# Patient Record
Sex: Female | Born: 1935 | Race: White | Hispanic: No | Smoking: Never smoker
Health system: Southern US, Community
[De-identification: ages and names within clinical notes are randomized; demographics above are authoritative.]

## PROBLEM LIST (undated history)

## (undated) DIAGNOSIS — M1990 Unspecified osteoarthritis, unspecified site: Secondary | ICD-10-CM

## (undated) DIAGNOSIS — D649 Anemia, unspecified: Secondary | ICD-10-CM

## (undated) DIAGNOSIS — K279 Peptic ulcer, site unspecified, unspecified as acute or chronic, without hemorrhage or perforation: Secondary | ICD-10-CM

## (undated) DIAGNOSIS — J45909 Unspecified asthma, uncomplicated: Secondary | ICD-10-CM

## (undated) DIAGNOSIS — K2970 Gastritis, unspecified, without bleeding: Secondary | ICD-10-CM

## (undated) DIAGNOSIS — F329 Major depressive disorder, single episode, unspecified: Secondary | ICD-10-CM

## (undated) DIAGNOSIS — K311 Adult hypertrophic pyloric stenosis: Secondary | ICD-10-CM

## (undated) DIAGNOSIS — J449 Chronic obstructive pulmonary disease, unspecified: Secondary | ICD-10-CM

## (undated) DIAGNOSIS — I10 Essential (primary) hypertension: Secondary | ICD-10-CM

## (undated) DIAGNOSIS — N200 Calculus of kidney: Secondary | ICD-10-CM

## (undated) HISTORY — DX: Chronic obstructive pulmonary disease, unspecified: J44.9

## (undated) HISTORY — DX: Calculus of kidney: N20.0

## (undated) HISTORY — DX: Gastritis, unspecified, without bleeding: K29.70

## (undated) HISTORY — DX: Adult hypertrophic pyloric stenosis: K31.1

## (undated) HISTORY — DX: Essential (primary) hypertension: I10

## (undated) HISTORY — DX: Anemia, unspecified: D64.9

## (undated) HISTORY — DX: Peptic ulcer, site unspecified, unspecified as acute or chronic, without hemorrhage or perforation: K27.9

## (undated) HISTORY — DX: Unspecified osteoarthritis, unspecified site: M19.90

## (undated) HISTORY — DX: Unspecified asthma, uncomplicated: J45.909

## (undated) HISTORY — DX: Major depressive disorder, single episode, unspecified: F32.9

---

## 1968-11-26 HISTORY — PX: KIDNEY SURGERY: SHX687

## 1968-11-26 HISTORY — PX: ABDOMINAL HYSTERECTOMY: SHX81

## 1982-11-26 HISTORY — PX: BACK SURGERY: SHX140

## 2004-04-03 ENCOUNTER — Encounter: Admission: RE | Admit: 2004-04-03 | Discharge: 2004-04-03 | Payer: Self-pay | Admitting: Internal Medicine

## 2008-04-21 ENCOUNTER — Encounter: Payer: Self-pay | Admitting: Family Medicine

## 2009-10-17 ENCOUNTER — Encounter: Payer: Self-pay | Admitting: Family Medicine

## 2009-12-02 ENCOUNTER — Ambulatory Visit: Payer: Self-pay | Admitting: Family Medicine

## 2009-12-02 DIAGNOSIS — F3289 Other specified depressive episodes: Secondary | ICD-10-CM

## 2009-12-02 DIAGNOSIS — N200 Calculus of kidney: Secondary | ICD-10-CM

## 2009-12-02 DIAGNOSIS — F329 Major depressive disorder, single episode, unspecified: Secondary | ICD-10-CM

## 2009-12-02 DIAGNOSIS — J45909 Unspecified asthma, uncomplicated: Secondary | ICD-10-CM | POA: Insufficient documentation

## 2009-12-02 DIAGNOSIS — F4321 Adjustment disorder with depressed mood: Secondary | ICD-10-CM

## 2009-12-02 HISTORY — DX: Other specified depressive episodes: F32.89

## 2009-12-02 HISTORY — DX: Calculus of kidney: N20.0

## 2009-12-02 HISTORY — DX: Major depressive disorder, single episode, unspecified: F32.9

## 2009-12-02 HISTORY — DX: Unspecified asthma, uncomplicated: J45.909

## 2009-12-22 ENCOUNTER — Telehealth: Payer: Self-pay | Admitting: Family Medicine

## 2010-01-04 ENCOUNTER — Telehealth: Payer: Self-pay | Admitting: Family Medicine

## 2010-01-27 ENCOUNTER — Ambulatory Visit: Payer: Self-pay | Admitting: Family Medicine

## 2010-01-27 DIAGNOSIS — M1990 Unspecified osteoarthritis, unspecified site: Secondary | ICD-10-CM

## 2010-01-27 HISTORY — DX: Unspecified osteoarthritis, unspecified site: M19.90

## 2010-02-10 ENCOUNTER — Ambulatory Visit: Payer: Self-pay | Admitting: Family Medicine

## 2010-02-10 DIAGNOSIS — I10 Essential (primary) hypertension: Secondary | ICD-10-CM

## 2010-02-10 HISTORY — DX: Essential (primary) hypertension: I10

## 2010-02-13 LAB — CONVERTED CEMR LAB
BUN: 36 mg/dL — ABNORMAL HIGH (ref 6–23)
Chloride: 100 meq/L (ref 96–112)
GFR calc non Af Amer: 46.8 mL/min (ref >60)
Potassium: 4.2 meq/L (ref 3.5–5.1)
Sodium: 136 meq/L (ref 135–145)

## 2010-04-04 ENCOUNTER — Telehealth: Payer: Self-pay | Admitting: Family Medicine

## 2010-04-05 ENCOUNTER — Telehealth: Payer: Self-pay | Admitting: Family Medicine

## 2010-05-15 ENCOUNTER — Ambulatory Visit: Payer: Self-pay | Admitting: Family Medicine

## 2010-06-01 ENCOUNTER — Telehealth: Payer: Self-pay | Admitting: Family Medicine

## 2010-07-03 ENCOUNTER — Telehealth: Payer: Self-pay | Admitting: Family Medicine

## 2010-07-05 ENCOUNTER — Encounter: Admission: RE | Admit: 2010-07-05 | Discharge: 2010-07-05 | Payer: Self-pay | Admitting: Pulmonary Disease

## 2010-08-02 ENCOUNTER — Telehealth: Payer: Self-pay | Admitting: Family Medicine

## 2010-08-17 ENCOUNTER — Ambulatory Visit: Payer: Self-pay | Admitting: Family Medicine

## 2010-08-18 ENCOUNTER — Encounter: Payer: Self-pay | Admitting: Gastroenterology

## 2010-08-21 LAB — CONVERTED CEMR LAB
Basophils Relative: 0.7 % (ref 0.0–3.0)
Eosinophils Relative: 0 % (ref 0.0–5.0)
HCT: 33.9 % — ABNORMAL LOW (ref 36.0–46.0)
Hemoglobin: 11.4 g/dL — ABNORMAL LOW (ref 12.0–15.0)
Lymphs Abs: 1.3 10*3/uL (ref 0.7–4.0)
MCV: 88.6 fL (ref 78.0–100.0)
Monocytes Absolute: 0.7 10*3/uL (ref 0.1–1.0)
Monocytes Relative: 3.9 % (ref 3.0–12.0)
Neutro Abs: 16.7 10*3/uL — ABNORMAL HIGH (ref 1.4–7.7)
WBC: 18.3 10*3/uL (ref 4.5–10.5)

## 2010-08-22 ENCOUNTER — Ambulatory Visit: Payer: Self-pay | Admitting: Family Medicine

## 2010-08-23 LAB — CONVERTED CEMR LAB
Eosinophils Absolute: 0.1 10*3/uL (ref 0.0–0.7)
Eosinophils Relative: 1.2 % (ref 0.0–5.0)
HCT: 29.8 % — ABNORMAL LOW (ref 36.0–46.0)
Lymphs Abs: 2.5 10*3/uL (ref 0.7–4.0)
MCHC: 34.5 g/dL (ref 30.0–36.0)
MCV: 87.6 fL (ref 78.0–100.0)
Monocytes Absolute: 0.9 10*3/uL (ref 0.1–1.0)
Neutrophils Relative %: 68.4 % (ref 43.0–77.0)
Platelets: 306 10*3/uL (ref 150.0–400.0)
RDW: 13.8 % (ref 11.5–14.6)
WBC: 11.5 10*3/uL — ABNORMAL HIGH (ref 4.5–10.5)

## 2010-09-25 ENCOUNTER — Ambulatory Visit: Payer: Self-pay | Admitting: Family Medicine

## 2010-09-25 DIAGNOSIS — R05 Cough: Secondary | ICD-10-CM

## 2010-09-25 DIAGNOSIS — D649 Anemia, unspecified: Secondary | ICD-10-CM | POA: Insufficient documentation

## 2010-09-25 HISTORY — DX: Anemia, unspecified: D64.9

## 2010-09-28 LAB — CONVERTED CEMR LAB
Eosinophils Absolute: 0.2 10*3/uL (ref 0.0–0.7)
Eosinophils Relative: 1.3 % (ref 0.0–5.0)
Lymphocytes Relative: 25.3 % (ref 12.0–46.0)
MCV: 88.4 fL (ref 78.0–100.0)
Monocytes Absolute: 1.6 10*3/uL — ABNORMAL HIGH (ref 0.1–1.0)
Neutrophils Relative %: 63.8 % (ref 43.0–77.0)
Platelets: 388 10*3/uL (ref 150.0–400.0)
WBC: 17.9 10*3/uL — ABNORMAL HIGH (ref 4.5–10.5)

## 2010-09-29 ENCOUNTER — Ambulatory Visit: Payer: Self-pay | Admitting: Cardiology

## 2010-09-29 ENCOUNTER — Ambulatory Visit: Payer: Self-pay | Admitting: Family Medicine

## 2010-09-29 ENCOUNTER — Encounter (INDEPENDENT_AMBULATORY_CARE_PROVIDER_SITE_OTHER): Payer: Self-pay | Admitting: *Deleted

## 2010-09-29 DIAGNOSIS — R634 Abnormal weight loss: Secondary | ICD-10-CM

## 2010-09-29 LAB — CONVERTED CEMR LAB
Bilirubin Urine: NEGATIVE
Glucose, Urine, Semiquant: NEGATIVE
pH: 5.5

## 2010-10-01 LAB — CONVERTED CEMR LAB
Alkaline Phosphatase: 59 units/L (ref 39–117)
Bilirubin, Direct: 0.1 mg/dL (ref 0.0–0.3)
Calcium: 9.4 mg/dL (ref 8.4–10.5)
GFR calc non Af Amer: 51.65 mL/min (ref >60)
Sodium: 139 meq/L (ref 135–145)

## 2010-10-03 ENCOUNTER — Encounter (INDEPENDENT_AMBULATORY_CARE_PROVIDER_SITE_OTHER): Payer: Self-pay | Admitting: *Deleted

## 2010-10-03 ENCOUNTER — Ambulatory Visit: Payer: Self-pay | Admitting: Gastroenterology

## 2010-10-03 ENCOUNTER — Encounter: Payer: Self-pay | Admitting: Family Medicine

## 2010-10-03 DIAGNOSIS — J4489 Other specified chronic obstructive pulmonary disease: Secondary | ICD-10-CM

## 2010-10-03 DIAGNOSIS — J449 Chronic obstructive pulmonary disease, unspecified: Secondary | ICD-10-CM

## 2010-10-03 HISTORY — DX: Chronic obstructive pulmonary disease, unspecified: J44.9

## 2010-10-03 HISTORY — DX: Other specified chronic obstructive pulmonary disease: J44.89

## 2010-10-03 LAB — CONVERTED CEMR LAB
AST: 27 units/L (ref 0–37)
Albumin: 4 g/dL (ref 3.5–5.2)
BUN: 33 mg/dL — ABNORMAL HIGH (ref 6–23)
Basophils Relative: 0.2 % (ref 0.0–3.0)
Eosinophils Relative: 0.3 % (ref 0.0–5.0)
Folate: >20 ng/mL
GFR calc non Af Amer: 41.49 mL/min (ref >60)
Glucose, Bld: 124 mg/dL — ABNORMAL HIGH (ref 70–99)
HCT: 34.2 % — ABNORMAL LOW (ref 36.0–46.0)
Hemoglobin: 11.7 g/dL — ABNORMAL LOW (ref 12.0–15.0)
IgA: 324 mg/dL (ref 68–378)
Lymphs Abs: 2.1 10*3/uL (ref 0.7–4.0)
MCV: 88.1 fL (ref 78.0–100.0)
Monocytes Absolute: 1.4 10*3/uL — ABNORMAL HIGH (ref 0.1–1.0)
Potassium: 3.7 meq/L (ref 3.5–5.1)
RBC: 3.89 M/uL (ref 3.87–5.11)
TSH: 0.89 microintl units/mL (ref 0.35–5.50)
Tissue Transglutaminase Ab, IgA: 12.2 units (ref <20)
WBC: 21.1 10*3/uL (ref 4.5–10.5)

## 2010-10-04 ENCOUNTER — Inpatient Hospital Stay (HOSPITAL_COMMUNITY): Admission: AD | Admit: 2010-10-04 | Discharge: 2010-10-18 | Payer: Self-pay | Admitting: Gastroenterology

## 2010-10-04 ENCOUNTER — Ambulatory Visit: Payer: Self-pay | Admitting: Gastroenterology

## 2010-10-04 ENCOUNTER — Telehealth: Payer: Self-pay | Admitting: Gastroenterology

## 2010-10-10 ENCOUNTER — Encounter: Payer: Self-pay | Admitting: Internal Medicine

## 2010-10-16 ENCOUNTER — Encounter: Payer: Self-pay | Admitting: Gastroenterology

## 2010-10-23 ENCOUNTER — Ambulatory Visit: Payer: Self-pay | Admitting: Family Medicine

## 2010-10-23 ENCOUNTER — Encounter: Payer: Self-pay | Admitting: Family Medicine

## 2010-10-23 DIAGNOSIS — K279 Peptic ulcer, site unspecified, unspecified as acute or chronic, without hemorrhage or perforation: Secondary | ICD-10-CM

## 2010-10-23 HISTORY — DX: Peptic ulcer, site unspecified, unspecified as acute or chronic, without hemorrhage or perforation: K27.9

## 2010-10-31 ENCOUNTER — Encounter (INDEPENDENT_AMBULATORY_CARE_PROVIDER_SITE_OTHER): Payer: Self-pay | Admitting: *Deleted

## 2010-10-31 ENCOUNTER — Ambulatory Visit: Payer: Self-pay | Admitting: Gastroenterology

## 2010-11-22 ENCOUNTER — Ambulatory Visit: Payer: Self-pay | Admitting: Family Medicine

## 2010-11-29 LAB — CONVERTED CEMR LAB
Basophils Relative: 0.8 % (ref 0.0–3.0)
Eosinophils Relative: 1.6 % (ref 0.0–5.0)
Lymphocytes Relative: 23.2 % (ref 12.0–46.0)
MCV: 85 fL (ref 78.0–100.0)
Monocytes Relative: 7.2 % (ref 3.0–12.0)
Neutrophils Relative %: 67.2 % (ref 43.0–77.0)
RBC: 3.47 M/uL — ABNORMAL LOW (ref 3.87–5.11)
WBC: 11.4 10*3/uL — ABNORMAL HIGH (ref 4.5–10.5)

## 2010-12-11 ENCOUNTER — Telehealth: Payer: Self-pay | Admitting: Family Medicine

## 2010-12-13 ENCOUNTER — Other Ambulatory Visit: Payer: Self-pay | Admitting: Gastroenterology

## 2010-12-13 ENCOUNTER — Encounter: Payer: Self-pay | Admitting: Gastroenterology

## 2010-12-13 ENCOUNTER — Ambulatory Visit
Admission: RE | Admit: 2010-12-13 | Discharge: 2010-12-13 | Payer: Self-pay | Source: Home / Self Care | Attending: Gastroenterology | Admitting: Gastroenterology

## 2010-12-13 DIAGNOSIS — K2970 Gastritis, unspecified, without bleeding: Secondary | ICD-10-CM | POA: Insufficient documentation

## 2010-12-13 DIAGNOSIS — K2990 Gastroduodenitis, unspecified, without bleeding: Secondary | ICD-10-CM

## 2010-12-14 LAB — HELICOBACTER PYLORI SCREEN-BIOPSY: UREASE: NEGATIVE

## 2010-12-28 NOTE — Assessment & Plan Note (Signed)
Summary: BRAND NEW PT/TO EST/PER DR Maryalyce Sanjuan/CJR   Vital Signs:  Patient profile:   76 year old female Menstrual status:  hysterectomy Height:      59.50 inches Weight:      122 pounds BMI:     24.32 Temp:     98.6 degrees F oral Pulse rate:   80 / minute Pulse rhythm:   regular Resp:     12 per minute BP sitting:   154 / 86  (left arm) Cuff size:   regular  Vitals Entered By: Sid Falcon LPN (December 02, 2009 11:24 AM) CC: New pt to establish     Menstrual Status hysterectomy   History of Present Illness: New patient to establish care.  Her problems include history of depression, remote history of kidney stones, glaucoma, mild intermittent asthma. She sees an ophthalmologist regularly. Depression stable on Zoloft. She has been on this for many years. No recent cough or wheezing. Uses albuterol as needed. No history of smoking but husband has been a long-term smoker.  no hx of hypertension.  No recent HA, dizziness, palpitations, or chest pain.  Surgical history includes hysterectomy around 1970 secondary to prolapse. She is not sure if this was a total abdominal hysterectomy. Lumbar disc surgery 1980s. No recent low back pain.  Family history unrevealing. Social history patient is married. Never smoked. No alcohol use. Has not had flu vaccine yet this year. She thinks she had prior Pneumovax. We are waiting for old records.  Preventive Screening-Counseling & Management  Alcohol-Tobacco     Smoking Status: never  Caffeine-Diet-Exercise     Does Patient Exercise: no  Past History:  Social History: Last updated: 12/02/2009 Retired Married Never Smoked Alcohol use-no Regular exercise-no  Risk Factors: Exercise: no (12/02/2009)  Risk Factors: Smoking Status: never (12/02/2009)  Past Medical History: Arthritis Asthma Glaucoma Kidney stones UTI  Past Surgical History: Hysterectomy  1970 Back surgery 1984 Kidney surgery 1970  Social  History: Retired Married Never Smoked Alcohol use-no Regular exercise-no Smoking Status:  never Does Patient Exercise:  no  Review of Systems  The patient denies anorexia, fever, weight loss, weight gain, vision loss, chest pain, syncope, dyspnea on exertion, peripheral edema, prolonged cough, headaches, hemoptysis, abdominal pain, melena, hematochezia, severe indigestion/heartburn, incontinence, and difficulty walking.    Physical Exam  General:  Well-developed,well-nourished,in no acute distress; alert,appropriate and cooperative throughout examination Eyes:  pupils equal, pupils round, and pupils reactive to light.   Ears:  External ear exam shows no significant lesions or deformities.  Otoscopic examination reveals clear canals, tympanic membranes are intact bilaterally without bulging, retraction, inflammation or discharge. Hearing is grossly normal bilaterally. Nose:  External nasal examination shows no deformity or inflammation. Nasal mucosa are pink and moist without lesions or exudates. Mouth:  Oral mucosa and oropharynx without lesions or exudates.  Teeth in good repair. Neck:  No deformities, masses, or tenderness noted. Lungs:  Normal respiratory effort, chest expands symmetrically. Lungs are clear to auscultation, no crackles or wheezes. Heart:  normal rate, regular rhythm, and no gallop.   Extremities:  no edema   Impression & Recommendations:  Problem # 1:  ASTHMA (ICD-493.90) mild and intermittent. Flu vaccine recommended. Need to confirm Pneumovax status from old records.  Problem # 2:  DEPRESSION (ICD-311)  Her updated medication list for this problem includes:    Sertraline Hcl 100 Mg Tabs (Sertraline hcl) .Marland Kitchen... 1/2 daily  Problem # 3:  ELEVATED BLOOD PRESSURE (ICD-796.2) Assessment: New no reported history  of hypertension. Bring back to reassess him in a couple of months. Lifestyle factors discussed.  Problem # 4:  NEPHROLITHIASIS (ICD-592.0)  Complete  Medication List: 1)  Oxycodone-acetaminophen 10-325 Mg Tabs (Oxycodone-acetaminophen) .... One tab 4 times daily 2)  Celebrex 200 Mg Caps (Celecoxib) .... Once daily 3)  Sertraline Hcl 100 Mg Tabs (Sertraline hcl) .... 1/2 daily 4)  Allergy Relief 4 Mg Tabs (Chlorpheniramine maleate) .... Two to four daily otc 5)  Xalatan 0.005 % Soln (Latanoprost) .... One drop to right eye daily 6)  Womens One Daily Tabs (Multiple vitamins-minerals) .... Once daily 7)  Vitamin B-12 500 Mcg Tabs (Cyanocobalamin) .... Once daily 8)  Eye-vite Plus Lutein Caps (Multiple vitamins-minerals) .... Once daily  Other Orders: Admin 1st Vaccine ([address]) Flu Vaccine 105yrs + ([address]454)  Patient Instructions: 1)  Please schedule a follow-up appointment in 2 months.  2)  Limit your Sodium(salt) .   Flu Vaccine Consent Questions     Do you have a history of severe allergic reactions to this vaccine? no    Any prior history of allergic reactions to egg and/or gelatin? no    Do you have a sensitivity to the preservative Thimersol? no    Do you have a past history of Guillan-Barre Syndrome? no    Do you currently have an acute febrile illness? no    Have you ever had a severe reaction to latex? no    Vaccine information given and explained to patient? yes    Are you currently pregnant? no    Lot Number:[id number]   Exp Date:05/25/2010   Site Given  Left Deltoid IMflu

## 2010-12-28 NOTE — Assessment & Plan Note (Signed)
Summary: 2 month rov/njr   Vital Signs:  Patient profile:   76 year old female Menstrual status:  hysterectomy Weight:      122 pounds Temp:     98.5 degrees F oral BP sitting:   170 / 110  (left arm) Cuff size:   regular  Vitals Entered By: Sid Falcon LPN (January 28, 6439 1:10 PM)  Serial Vital Signs/Assessments:  Time      Position  BP       Pulse  Resp  Temp     By                     170/90                         Evelena Peat MD  CC: 2 month follow-up, BP check   History of Present Illness: Patient here for followup regarding several items as follows.  History of elevated blood pressure last visit. Has checked at home a couple of occasions with blood pressures around 160 systolic. Occasional flushed feeling but no headaches or dizziness. Denies chest pains. Never treated for hypertension. Does not add salt to foods.  Bilateral shoulder pains left greater than right. No injury. Aching pain especially at night and with abduction and internal rotation. Denies weakness. Pain for several weeks and somewhat progressive.  Using Celebrex and also on Oxycodone chronically for pain without relief.  History osteoarthritis takes chronic oxycodone and is requesting refills. Also uses Celebrex 200 mg daily.  Allergies: 1)  Mobic (Meloxicam) 2)  Demerol (Meperidine Hcl)  Past History:  Past Medical History: Last updated: 12/02/2009 Arthritis Asthma Glaucoma Kidney stones UTI  Past Surgical History: Last updated: 12/02/2009 Hysterectomy  1970 Back surgery 1984 Kidney surgery 1970  Social History: Last updated: 12/02/2009 Retired Married Never Smoked Alcohol use-no Regular exercise-no PMH-FH-SH reviewed for relevance  Review of Systems  The patient denies anorexia, fever, weight loss, chest pain, syncope, dyspnea on exertion, peripheral edema, headaches, abdominal pain, melena, hematochezia, and difficulty walking.    Physical Exam  General:   Well-developed,well-nourished,in no acute distress; alert,appropriate and cooperative throughout examination Mouth:  Oral mucosa and oropharynx without lesions or exudates.  Teeth in good repair. Neck:  No deformities, masses, or tenderness noted. Lungs:  Normal respiratory effort, chest expands symmetrically. Lungs are clear to auscultation, no crackles or wheezes. Heart:  normal rate and regular rhythm.   Extremities:  no edema. No reproducible point tenderness to the shoulder. Pain with internal rotation and abduction against resistance left shoulder. No a.c. joint tenderness. No bicipital tenderness. Neurologic:  alert & oriented X3 and cranial nerves II-XII intact.     Impression & Recommendations:  Problem # 1:  ELEVATED BLOOD PRESSURE (ICD-796.2) Assessment Deteriorated  need to initiate treatment. We'll start lisinopril HCTZ and reassess blood pressure 2 weeks.  Check BMP then.  Her updated medication list for this problem includes:    Lisinopril-hydrochlorothiazide 10-12.5 Mg Tabs (Lisinopril-hydrochlorothiazide) ..... 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 Prepped the left shoulder with Betadine and using 25-gauge 1-1/2 inch needle injected 1 cc of Depo-Medrol and 2 cc of plain Xylocaine without difficulty. Work on range of motion and reassess in 2 weeks Orders: Joint Aspirate / Injection, Large (20610) Depo- Medrol 80mg  (J1040)  Problem # 3:  OSTEOARTHRITIS (  ICD-715.90) Assessment: Unchanged refilled meds. Her updated medication list for this problem includes:    Oxycodone-acetaminophen 10-325 Mg Tabs (Oxycodone-acetaminophen) ..... One tab 4 times daily    Celebrex 200 Mg Caps (Celecoxib) ..... Once daily  Complete Medication List: 1)  Oxycodone-acetaminophen 10-325 Mg Tabs (Oxycodone-acetaminophen)  .... One tab 4 times daily 2)  Celebrex 200 Mg Caps (Celecoxib) .... Once daily 3)  Sertraline Hcl 100 Mg Tabs (Sertraline hcl) .... 1/2 daily 4)  Allergy Relief 4 Mg Tabs (Chlorpheniramine maleate) .... Two to four daily otc 5)  Xalatan 0.005 % Soln (Latanoprost) .... One drop to right eye daily 6)  Womens One Daily Tabs (Multiple vitamins-minerals) .... Once daily 7)  Vitamin B-12 500 Mcg Tabs (Cyanocobalamin) .... Once daily 8)  Eye-vite Plus Lutein Caps (Multiple vitamins-minerals) .... Once daily 9)  Lisinopril-hydrochlorothiazide 10-12.5 Mg Tabs (Lisinopril-hydrochlorothiazide) .... One by mouth once daily  Patient Instructions: 1)  Please schedule a follow-up appointment in 2 weeks.  2)  Limit your Sodium(salt) .  Prescriptions: CELEBREX 200 MG CAPS (CELECOXIB) once daily  #30 x 5   Entered and Authorized by:   Evelena Peat MD   Signed by:   Evelena Peat MD on 01/27/2010   Method used:   [facility]        [facility] [address] [phone]* (retail)       [address]       [place], [place]  [address]       Ph: [phone] or [phone]       Fax: [phone]   RxID:   [id number] LISINOPRIL-HYDROCHLOROTHIAZIDE 10-12.5 MG TABS (LISINOPRIL-HYDROCHLOROTHIAZIDE) one by mouth once daily  #30 x 5   Entered and Authorized by:   Evelena Peat MD   Signed by:   Evelena Peat MD on 01/27/2010   Method used:   [facility]        [facility] [address] [phone]* (retail)       [address]       [place], [place]  [address]       Ph: [phone] or [phone]       Fax: [phone]   RxID:   [id number] OXYCODONE-ACETAMINOPHEN 10-325 MG TABS (OXYCODONE-ACETAMINOPHEN) one tab 4 times daily  #120 x 0   Entered and Authorized by:   Evelena Peat MD   Signed by:   Evelena Peat MD on 01/27/2010   Method used:   Print then Give to Patient   RxID:   [id number]

## 2010-12-28 NOTE — Progress Notes (Signed)
Summary: pt req script for Oxycodone 10-325mg   Phone Note Refill Request Call back at Home Phone [phone]   Refills Requested: Medication #1:  OXYCODONE-ACETAMINOPHEN 10-325 MG TABS one tab 4 times daily   Dosage confirmed as above?Dosage Confirmed   Supply Requested: 1 month  Method Requested: Pick up at Office Initial call taken by: Lucy Antigua,  June 01, 2010 10:37 AM  Follow-up for Phone Call        Last filled 5-11 [facility] LPN  June 01, 1477 5:34 PM refilled. Follow-up by: Evelena Peat MD,  June 02, 2010 7:51 AM  Additional Follow-up for Phone Call Additional follow up Details #1::        Husband informed RX ready for pick-up Additional Follow-up by: Sid Falcon LPN,  June 03, 2955 8:28 AM    Prescriptions: OXYCODONE-ACETAMINOPHEN 10-325 MG TABS (OXYCODONE-ACETAMINOPHEN) one tab 4 times daily  #120 x 0   Entered and Authorized by:   Evelena Peat MD   Signed by:   Evelena Peat MD on 06/02/2010   Method used:   Print then Give to Patient   RxID:   [id number]

## 2010-12-28 NOTE — Letter (Signed)
Summary: EGD Instructions  [facility]  [address] [place], [place] [address]   Phone: [phone]  Fax: [phone]       MARIAN MENEELY    1935/01/11    MRN: [id number]       Procedure Day /Date: 10/04/2010 Wednesday     Arrival Time: 7:30am     Procedure Time: 8:00am     Location of Procedure:                    [facility] (4th Floor)    PREPARATION FOR ENDOSCOPY   On 10/04/2010 THE DAY OF THE PROCEDURE:  1.   No solid foods, milk or milk products are allowed after midnight the night before your procedure.  2.   Do not drink anything colored red or purple.  Avoid juices with pulp.  No orange juice.  3.  You may drink clear liquids until 6:00am, which is 2 hours before your procedure.                                                                                                CLEAR LIQUIDS INCLUDE: Water Jello Ice Popsicles Tea (sugar ok, no milk/cream) Powdered fruit flavored drinks Coffee (sugar ok, no milk/cream) Gatorade Juice: apple, white grape, white cranberry  Lemonade Clear bullion, consomm, broth Carbonated beverages (any kind) Strained chicken noodle soup Hard Candy   MEDICATION INSTRUCTIONS  Unless otherwise instructed, you should take regular prescription medications with a small sip of water as early as possible the morning of your procedure.               OTHER INSTRUCTIONS  You will need a responsible adult at least 74 years of age to accompany you and drive you home.   This person must remain in the waiting room during your procedure.  Wear loose fitting clothing that is easily removed.  Leave jewelry and other valuables at home.  However, you may wish to bring a book to read or an iPod/MP3 player to listen to music as you wait for your procedure to start.  Remove all body piercing jewelry and leave at home.  Total time from sign-in until discharge is approximately 2-3 hours.  You should go  home directly after your procedure and rest.  You can resume normal activities the day after your procedure.  The day of your procedure you should not:   Drive   Make legal decisions   Operate machinery   Drink alcohol   Return to work  You will receive specific instructions about eating, activities and medications before you leave.    The above instructions have been reviewed and explained to me by   _______________________    I fully understand and can verbalize these instructions _____________________________ Date _________

## 2010-12-28 NOTE — Progress Notes (Signed)
Summary: MRI now needs clarifications  Phone Note From Other Clinic   Caller: [facility] [phone] Call For: Dr Jarold Motto Summary of Call: Is starting MRI now and needs clarifications on order. Initial call taken by: Leanor Kail [facility],  October 04, 2010 2:11 PM  Follow-up for Phone Call        advised them to page Mike Gip, PA since pt is INPT. Follow-up by: [name] CMA Duncan Dull),  October 04, 2010 2:18 PM

## 2010-12-28 NOTE — Progress Notes (Signed)
Summary: new rx @ new pharmacy  Phone Note Refill Request Call back at Home Phone [phone] Message from:  Patient---live call on *****new pharmacy******  Refills Requested: Medication #1:  SERTRALINE HCL 100 MG TABS 1/2 daily [facility]  Initial call taken by: Warnell Forester,  July 03, 2010 2:45 PM    Prescriptions: SERTRALINE HCL 100 MG TABS (SERTRALINE HCL) 1/2 daily  #30 x 1   Entered by:   Sid Falcon LPN   Authorized by:   Evelena Peat MD   Signed by:   Sid Falcon LPN on 09/81/1914   Method used:   [facility]        [facility] [address] [phone]* (retail)       [address]       [place], [place]  [address]       Ph: [phone] or [phone]       Fax: [phone]   RxID:   [id number]

## 2010-12-28 NOTE — Procedures (Signed)
Summary: Upper Endoscopy  Patient: [name] Herman Note: All result statuses are Final unless otherwise noted.  Tests: (1) Upper Endoscopy (EGD)   EGD Upper Endoscopy       DONE      [facility]     [address]     [place], [place]  [address]           ENDOSCOPY PROCEDURE REPORT           PATIENT:  [name] Herman, [name] Herman  MR#:  [id number]     BIRTHDATE:  1935/01/11, 75 yrs. old  GENDER:  female     ENDOSCOPIST:  Rachael Fee, MD     PROCEDURE DATE:  10/16/2010     PROCEDURE:  EGD, diagnostic 29528     ASA CLASS:  Class II     INDICATIONS:  previous pyloric stenosis (Dr. Jarold Motto EGD 2 weeks     ago), small gastric ulcer, H. pylori + by CLO and was taking     NSAIDs.     MEDICATIONS:  Fentanyl 50 mcg IV, Versed 4 mg IV     TOPICAL ANESTHETIC:  Cetacaine Spray     DESCRIPTION OF PROCEDURE:   After the risks benefits and     alternatives of the procedure were thoroughly explained, informed     consent was obtained.  The Pentax Gastroscope [id number] endoscope     was introduced through the mouth and advanced to the second     portion of the duodenum, without limitations.  The instrument was     slowly withdrawn as the mucosa was fully examined.     <<PROCEDUREIMAGES>>     The previously noted pylroric stenosis has improved somewhat. I     was able to get a standard adult gastroscope through the benign     appearing stenosis with mild pressure. The lumen was 3mm,     surrounded by signficant edema. Bulb of duodenum, 2nd duodenum     were normal (see image003 and image004).  Otherwise the     examination was normal (see image005 and image001).  There was a     small amount of retained liquid in proximal stomach (see     image002).    Retroflexed views revealed no abnormalities.    The     scope was then withdrawn from the patient and the procedure     completed.     COMPLICATIONS:  None           ENDOSCOPIC IMPRESSION:     1) Pyloric stenosis, improving but still  present     2) Otherwise normal examination           RECOMMENDATIONS:     Full liquid diet, should continue this for another 7 days.     Increase diet slowly as tolerated after that.     OK to d/c home after current TNA bag is completed as long as she     continues to tolerate full liquids     Will change to PO H. pylori treatment (should continue for 14     days total).     Should avoid NSAIDs.           ______________________________     Rachael Fee, MD           n.     eSIGNED:   Rachael Fee at 10/16/2010 12:08 PM           Rushie Nyhan, [phone]  Note: An exclamation mark (!) indicates a result that was not dispersed into the flowsheet. Document Creation Date: 10/16/2010 12:09 PM _______________________________________________________________________  (1) Order result status: Final Collection or observation date-time: 10/16/2010 11:56 Requested date-time:  Receipt date-time:  Reported date-time:  Referring Physician:   Ordering Physician: Rob Bunting [phone]) Specimen Source:  Source: Launa Grill Order Number: [id number] Lab site:

## 2010-12-28 NOTE — Assessment & Plan Note (Signed)
Summary: DIARRHEA & HEMATOCHEZIA/YF   History of Present Illness Primary GI MD: Sheryn Bison MD FACP FAGA Primary Clotilde Loth: Evelena Peat, MD Requesting Waylan Busta: Evelena Peat, MD Chief Complaint: Generalized abd pain and cramping with lower back pain. Pt also has 3-4 days out of a week of N/V. Pt states she does not have a appetitie and has lost 15 lbs over 6 months. Pt states she had one episode of rectal bleeding 4-5 months ago and has not seen any blood since.  History of Present Illness:   76 year old Caucasian female referred by Dr. Caryl Never for evaluation of anorexia, aching pain weight loss, acid reflux symptoms, upper abdominal pain radiating into her back, nausea and vomiting, gas and bloating. These probably been going on for several months and exacerbated by chronic coughing related to lisinopril use. She also has a history of osteoporosis and probable compression fracture of her spine which limits her mobility. She is chronically on hydrocodone several times a day but denies constipation. She had one episode of rectal bleeding 2 weeks ago. Review of her chart shows no specific abnormalities except for not explain leukocytosis currently being treated with Cipro 250 mg b.i.d. for 7 days. She denies genitourinary complaints, fever, chills, icterus, or other systemic problems. She also denies any specific hepatobiliary complaints.  Recent CT scan of the abdomen showed multiple liver lesions, metastases versus liver cyst. The bases of the lungs were seen on CT scan were normal. She continues with a chronic nonproductive cough and rather typical acid reflux symptoms. She does have a diagnosis of asthmatic bronchitis. She denies abuse of alcohol, cigarettes, or NSAIDs. Family history is remarkable for prostate cancer her father but no known colon cancer.   GI Review of Systems    Reports abdominal pain, acid reflux, loss of appetite, nausea, vomiting, and  weight loss.     Location of   Abdominal pain: generalized. Weight loss of 15 lbs pounds over 6 months.   Denies belching, bloating, chest pain, dysphagia with liquids, dysphagia with solids, heartburn, vomiting blood, and  weight gain.      Reports rectal bleeding.     Denies anal fissure, black tarry stools, change in bowel habit, constipation, diarrhea, diverticulosis, fecal incontinence, heme positive stool, hemorrhoids, irritable bowel syndrome, jaundice, light color stool, liver problems, and  rectal pain.    Current Medications (verified): 1)  Oxycodone-Acetaminophen 10-325 Mg Tabs (Oxycodone-Acetaminophen) .... One Tab 4 Times Daily 2)  Celebrex 200 Mg Caps (Celecoxib) .... Once Daily 3)  Sertraline Hcl 100 Mg Tabs (Sertraline Hcl) .... 1/2 Daily 4)  Allergy Relief 4 Mg Tabs (Chlorpheniramine Maleate) .... Two To Four Daily Otc 5)  Xalatan 0.005 % Soln (Latanoprost) .... One Drop To Right Eye Daily 6)  Womens One Daily  Tabs (Multiple Vitamins-Minerals) .... Once Daily 7)  Vitamin B-12 500 Mcg Tabs (Cyanocobalamin) .... Once Daily 8)  Eye-Vite Plus Lutein  Caps (Multiple Vitamins-Minerals) .... Once Daily 9)  Losartan Potassium-Hctz 50-12.5 Mg Tabs (Losartan Potassium-Hctz) .... One By Mouth Once Daily 10)  Promethazine Hcl 25 Mg Tabs (Promethazine Hcl) .... One By Mouth Q 4-6 Hours As Needed Nausea and Vomiting 11)  Ciprofloxacin Hcl 250 Mg Tabs (Ciprofloxacin Hcl) .... One By Mouth Two Times A Day For 7 Days  Allergies (verified): 1)  Mobic (Meloxicam) 2)  Demerol (Meperidine Hcl)  Past History:  Past medical, surgical, family and social histories (including risk factors) reviewed for relevance to current acute and chronic problems.  Past Medical History: Reviewed  history from 05/15/2010 and no changes required. Arthritis Asthma Glaucoma Kidney stones Hypertension  Past Surgical History: Reviewed history from 12/02/2009 and no changes required. Hysterectomy  1970 Back surgery 1984 Kidney surgery  1970  Family History: Reviewed history and no changes required. Family History of Prostate Cancer:Father No FH of Colon Cancer:  Social History: Reviewed history from 12/02/2009 and no changes required. Retired Married Never Smoked Alcohol use-no Regular exercise-no Daily Caffeine Use  Review of Systems       The patient complains of arthritis/joint pain, back pain, blood in urine, cough, and shortness of breath.  The patient denies allergy/sinus, anemia, anxiety-new, breast changes/lumps, change in vision, confusion, coughing up blood, depression-new, fainting, fatigue, fever, headaches-new, hearing problems, heart murmur, heart rhythm changes, itching, menstrual pain, muscle pains/cramps, night sweats, nosebleeds, pregnancy symptoms, skin rash, sleeping problems, sore throat, swelling of feet/legs, swollen lymph glands, thirst - excessive , urination - excessive , urination changes/pain, urine leakage, vision changes, and voice change.   General:  Complains of weakness, malaise, and weight loss; denies fever, chills, sweats, anorexia, fatigue, and sleep disorder. ENT:  Denies earache, ear discharge, tinnitus, decreased hearing, nasal congestion, loss of smell, nosebleeds, sore throat, hoarseness, and difficulty swallowing. CV:  Complains of dyspnea on exertion; denies chest pains, angina, palpitations, syncope, orthopnea, PND, peripheral edema, and claudication. Resp:  Complains of dyspnea with exercise, cough, sputum, and wheezing; denies dyspnea at rest, coughing up blood, and pleurisy. GI:  Complains of nausea, indigestion/heartburn, vomiting, gas/bloating, and change in bowel habits; denies difficulty swallowing, pain on swallowing, vomiting blood, abdominal pain, jaundice, diarrhea, constipation, bloody BM's, black BMs, and fecal incontinence. GU:  Complains of blood in urine; denies urinary burning, nocturnal urination, urinary frequency, urinary incontinence, abnormal vaginal  bleeding, amenorrhea, menorrhagia, vaginal discharge, pelvic pain, genital sores, painful intercourse, and decreased libido; history of nephrolithiasis, previous unknown" kidney surgery", previous hysterectomy.. MS:  Complains of joint pain / LOM, joint swelling, joint stiffness, low back pain, and muscle atrophy; denies joint deformity, muscle weakness, muscle cramps, leg pain at night, leg pain with exertion, and shoulder pain / LOM hand / wrist pain (CTS); severe back pain with associated limited mobility and a history of previous possible laminectomy.. Derm:  Denies rash, itching, dry skin, hives, moles, warts, and unhealing ulcers. Neuro:  Complains of difficulty walking; denies weakness, paralysis, abnormal sensation, seizures, syncope, tremors, vertigo, transient blindness, frequent falls, frequent headaches, headache, sciatica, radiculopathy other:, restless legs, memory loss, and confusion. Psych:  Complains of depression and anxiety; denies memory loss, suicidal ideation, hallucinations, paranoia, phobia, and confusion. Endo:  Denies cold intolerance, heat intolerance, polydipsia, polyphagia, polyuria, unusual weight change, and hirsutism. Heme:  Denies bruising, bleeding, enlarged lymph nodes, and pagophagia.  Vital Signs:  Patient profile:   76 year old female Menstrual status:  hysterectomy Height:      59.50 inches Weight:      101.50 pounds BMI:     20.23 Pulse rate:   80 / minute Pulse rhythm:   regular BP sitting:   118 / 84  (left arm) Cuff size:   regular  Vitals Entered By: Christie Nottingham CMA Duncan Dull) (October 03, 2010 9:27 AM)  Physical Exam  General:  chronically ill-appearing, pale, and very short elderly lady in no acute distress. Head:  Normocephalic and atraumatic. Eyes:  PERRLA, no icterus.exam deferred to patient's ophthalmologist.   Mouth:  No deformity or lesions, dentition normal. Neck:  Supple; no masses or thyromegaly. Lungs:  decreased BS on  L and  decreased BS on R.   Heart:  Regular rate and rhythm; no murmurs, rubs,  or bruits. Abdomen:  Soft, nontender and nondistended. No masses, hepatosplenomegaly or hernias noted. Normal bowel sounds. Rectal:  Normal exam.Hard fixed nodule felt at the tip of my digit. There was solid stool present was +1 guaiac positive. The rectal tenderness, fissures, or fistulae noted. Msk:  arthritic changes and Lordosis.   Extremities:  No clubbing, cyanosis, edema or deformities noted. Neurologic:  Alert and  oriented x4;  grossly normal neurologically.weakness noted.   Skin:  Intact without significant lesions or rashes. Cervical Nodes:  No significant cervical adenopathy. Psych:  Alert and cooperative. Normal mood and affect.depressed affect and anxious.     Impression & Recommendations:  Problem # 1:  ABDOMINAL PAIN, EPIGASTRIC (ICD-789.06) Assessment Deteriorated She has midabdominal pain, nausea vomiting, and anorexia and weight loss. There is a loud succussion splash the epigastric area today suggesting possible gastric outlet obstruction. I will repeat her labs, leave her on clear liquids, prescribed b.i.d. PPI, and proceed with endoscopy. She also had guaiac positive stools probably from lower GI source. Colonoscopy will be scheduled after her endoscopy. She has rather classic acid reflux exacerbated by her severe coughing related to lisinopril. The cause of her leukocytosis is unclear, probable genitourinary source, and she is on p.o. Cipro. Orders: TLB-CBC Platelet - w/Differential (85025-CBCD) TLB-BMP (Basic Metabolic Panel-BMET) (80048-METABOL) TLB-Hepatic/Liver Function Pnl (80076-HEPATIC) TLB-TSH (Thyroid Stimulating Hormone) (84443-TSH) TLB-B12, Serum-Total ONLY (16109-U04) TLB-Ferritin (82728-FER) TLB-Folic Acid (Folate) (82746-FOL) TLB-IBC Pnl (Iron/FE;Transferrin) (83550-IBC) T-igA (54098) T-Sprue Panel (Celiac Disease Aby Eval) (83516x3/[address])  Problem # 2:  COPD  (ICD-496) Assessment: Unchanged Continue Inhalers . She may need chest CT scan additionally. Orders: TLB-CBC Platelet - w/Differential (85025-CBCD) TLB-BMP (Basic Metabolic Panel-BMET) (80048-METABOL) TLB-Hepatic/Liver Function Pnl (80076-HEPATIC) TLB-TSH (Thyroid Stimulating Hormone) (84443-TSH) TLB-B12, Serum-Total ONLY (11914-N82) TLB-Ferritin (82728-FER) TLB-Folic Acid (Folate) (82746-FOL) TLB-IBC Pnl (Iron/FE;Transferrin) (83550-IBC) T-igA (95621) T-Sprue Panel (Celiac Disease Aby Eval) (83516x3/[address])  Problem # 3:  ANEMIA (ICD-285.9) Assessment: Unchanged Abnormal rectal exam and guaiac positive stools---rule out rectosigmoid carcinoma. As per above, we'll initially start her workup with labs and endoscopy. I suspect there may be an element of occult NSAID use also.  Problem # 4:  COUGH, CHRONIC (ICD-786.2) Assessment: Unchanged Possible all related to lisinopril but she has been off of this medication from what I can see for several weeks without improvement. Chronic cough unrelieved by hydrocodone use is of great concern for possible underlying lung carcinoma.  Problem # 5:  OSTEOARTHRITIS (ICD-715.90) Assessment: Deteriorated Probable severe osteoporosis with possible vertebral collapse and associated referred pain from her back to her thorax.In Addition to Hydrocodone she is on Celebrex. TPA therapy has been initiated.  Problem # 6:  ELEVATED BLOOD PRESSURE (ICD-796.2) Assessment: Improved blood pressure today normal at 118 her 84.  Problem # 7:  DEPRESSION (ICD-311) Assessment: Unchanged continue Sertraline 50 mg a day as tolerated.  Other Orders: EGD (EGD)  Patient Instructions: 1)  Copy sent to : Evelena Peat, MD 2)  Please go to the basement today for your labs.  3)  Your prescription(s) have been sent to you pharmacy.  4)  Take your Nexium samples once a day. 5)  Your procedure has been scheduled for 10/04/2010, please follow the seperate  instructions.  6)  [facility] Patient Information Guide given to patient.  7)  Upper Endoscopy brochure given.  8)  The medication list was reviewed and reconciled.  All changed /  newly prescribed medications were explained.  A complete medication list was provided to the patient / caregiver. Prescriptions: MOVIPREP 100 GM  SOLR (PEG-KCL-NACL-NASULF-NA ASC-C) As per prep instructions.  #1 x 0   Entered by:   [name] CMA (AAMA)   Authorized by:   Mardella Layman MD [facility]   Signed by:   [name] CMA (AAMA) on 10/03/2010   Method used:   [facility]        [facility] [phone]* (retail)       [address]       [place], [place]  [address]       Ph: [phone] or [phone]       Fax: [phone]   RxID:   [id number]

## 2010-12-28 NOTE — Progress Notes (Signed)
Summary: REFILL REQUEST Oxycodone  Phone Note Refill Request Message from:  Patient on August 02, 2010 12:03 PM  Refills Requested: Medication #1:  OXYCODONE-ACETAMINOPHEN 10-325 MG TABS one tab 4 times daily   Notes: Pt can be reached at [phone] when Rx is ready for p/u.    Initial call taken by: Debbra Riding,  August 02, 2010 12:04 PM  Follow-up for Phone Call        Last filled #120, 0 refills on 06/02/10 Sid Falcon LPN  August 02, 2010 1:54 PM will refill Follow-up by: Evelena Peat MD,  August 03, 2010 10:39 AM  Additional Follow-up for Phone Call Additional follow up Details #1::        Pt informed Additional Follow-up by: Sid Falcon LPN,  August 03, 2010 11:00 AM    Prescriptions: OXYCODONE-ACETAMINOPHEN 10-325 MG TABS (OXYCODONE-ACETAMINOPHEN) one tab 4 times daily  #120 x 0   Entered and Authorized by:   Evelena Peat MD   Signed by:   Evelena Peat MD on 08/03/2010   Method used:   Print then Give to Patient   RxID:   [id number]

## 2010-12-28 NOTE — Assessment & Plan Note (Signed)
Summary: fup/cjr   Vital Signs:  Patient profile:   76 year old female Menstrual status:  hysterectomy Weight:      106 pounds Temp:     98.0 degrees F oral BP sitting:   150 / 88  (left arm) Cuff size:   regular  Vitals Entered By: Sid Falcon LPN (September 25, 2010 1:35 PM)  History of Present Illness: Patient seen for the following items.  Recent issue of hematochezia. No episodes since last visit. Scheduled to see gastroenterologist next week. Patient has mild anemia and states she's had some chronic anemia. She has had some weight loss which she attributes to chronic cough. Cough seems to worsen after eating and she states her appetite is decreased because of the cough.  Saw pulmonologist for cough that has gone for a few months if not longer. She states she had spirometry and chest x-ray 3 months ago that were unremarkable. She does take Asmanex for presumed asthma. No postnasal drip symptoms. Cough is dry. Does consume significant caffeine and using mentholated products with cough drops. Also takes lisinopril. She held this for one week and cough did not improve. Nonsmoker.  no hemoptysis.  Allergies: 1)  Mobic (Meloxicam) 2)  Demerol (Meperidine Hcl)  Past History:  Past Medical History: Last updated: 05/15/2010 Arthritis Asthma Glaucoma Kidney stones Hypertension  Past Surgical History: Last updated: 12/02/2009 Hysterectomy  1970 Back surgery 1984 Kidney surgery 1970  Social History: Last updated: 12/02/2009 Retired Married Never Smoked Alcohol use-no Regular exercise-no  Risk Factors: Exercise: no (12/02/2009)  Risk Factors: Smoking Status: never (12/02/2009) PMH-FH-SH reviewed for relevance  Review of Systems       The patient complains of anorexia, weight loss, and prolonged cough.  The patient denies fever, chest pain, syncope, dyspnea on exertion, peripheral edema, headaches, hemoptysis, abdominal pain, melena, hematochezia, and severe  indigestion/heartburn.    Physical Exam  General:  Well-developed,well-nourished,in no acute distress; alert,appropriate and cooperative throughout examination Mouth:  Oral mucosa and oropharynx without lesions or exudates.  Teeth in good repair. Neck:  No deformities, masses, or tenderness noted. Lungs:  Normal respiratory effort, chest expands symmetrically. Lungs are clear to auscultation, no crackles or wheezes. Heart:  normal rate and regular rhythm.   Abdomen:  Bowel sounds positive,abdomen soft and non-tender without masses, organomegaly or hernias noted. Extremities:  No clubbing, cyanosis, edema, or deformity noted with normal full range of motion of all joints.   Cervical Nodes:  No lymphadenopathy noted   Impression & Recommendations:  Problem # 1:  COUGH, CHRONIC (ICD-786.2) discontinue ACE inhibitor. Reduction of caffeine and mentholated products. Elevate head of bed. Continue followup with pulmonologist if persists.  Consider addition of PPI if cough persists with the above measures.  Problem # 2:  ESSENTIAL HYPERTENSION (ICD-401.9) change to Losartan from lisinopril. Her updated medication list for this problem includes:    Losartan Potassium-hctz 50-12.5 Mg Tabs (Losartan potassium-hctz) ..... One by mouth once daily  Problem # 3:  ANEMIA (ICD-285.9) repeat  CBC.  No further reported hematochezia.  Pt reports she has had chronic mild anemia.  However, in reviewing old labs 5/09 hgb 12.4 and over 13 back in 2007. Her updated medication list for this problem includes:    Vitamin B-12 500 Mcg Tabs (Cyanocobalamin) ..... Once daily  Orders: TLB-CBC Platelet - w/Differential (85025-CBCD) Specimen Handling (16109)  Complete Medication List: 1)  Oxycodone-acetaminophen 10-325 Mg Tabs (Oxycodone-acetaminophen) .... One tab 4 times daily 2)  Celebrex 200 Mg Caps (Celecoxib) .Marland KitchenMarland KitchenMarland Kitchen  Once daily 3)  Sertraline Hcl 100 Mg Tabs (Sertraline hcl) .... 1/2 daily 4)  Allergy Relief 4  Mg Tabs (Chlorpheniramine maleate) .... Two to four daily otc 5)  Xalatan 0.005 % Soln (Latanoprost) .... One drop to right eye daily 6)  Womens One Daily Tabs (Multiple vitamins-minerals) .... Once daily 7)  Vitamin B-12 500 Mcg Tabs (Cyanocobalamin) .... Once daily 8)  Eye-vite Plus Lutein Caps (Multiple vitamins-minerals) .... Once daily 9)  Losartan Potassium-hctz 50-12.5 Mg Tabs (Losartan potassium-hctz) .... One by mouth once daily  Other Orders: Flu Vaccine 71yrs + MEDICARE PATIENTS (B1478) Administration Flu vaccine - MCR (G9562)  Patient Instructions: 1)  Elevate head of bed 6-8 inches 2)  Avoid mentholated products, spearmint, or peppermint 3)  Gradually reduce caffeine use 4)  Please schedule a follow-up appointment in 1 month.  Prescriptions: LOSARTAN POTASSIUM-HCTZ 50-12.5 MG TABS (LOSARTAN POTASSIUM-HCTZ) one by mouth once daily  #30 x 6   Entered and Authorized by:   Evelena Peat MD   Signed by:   Evelena Peat MD on 09/25/2010   Method used:   [facility]        [facility] [address] [phone]* (retail)       [address]       [place], [place]  [address]       Ph: [phone] or [phone]       Fax: [phone]   RxID:   [id number]    Orders Added: 1)  Flu Vaccine 66yrs + MEDICARE PATIENTS [Q2039] 2)  Administration Flu vaccine - MCR [G0008] 3)  TLB-CBC Platelet - w/Differential [85025-CBCD] 4)  Specimen Handling [99000] 5)  Est. Patient Level IV [51884]   Flu Vaccine Consent Questions     Do you have a history of severe allergic reactions to this vaccine? no    Any prior history of allergic reactions to egg and/or gelatin? no    Do you have a sensitivity to the preservative Thimersol? no    Do you have a past history of Guillan-Barre Syndrome? no    Do you currently have an acute febrile illness? no    Have you ever had a severe reaction to latex? no    Vaccine information given and explained to patient? yes    Are  you currently pregnant? no    Lot Number:[id number]   Exp Date:05/26/2011   Site Given  Left Deltoid IM         [email or web address]

## 2010-12-28 NOTE — Assessment & Plan Note (Signed)
Summary: 3 MTH ROV // RS   Vital Signs:  Patient profile:   76 year old female Menstrual status:  hysterectomy Weight:      116 pounds Temp:     98.6 degrees F oral BP sitting:   140 / 80  (left arm) Cuff size:   regular  Vitals Entered By: Sid Falcon LPN (May 15, 2010 1:12 PM) CC: 3 month follow-up, Hypertension Management   History of Present Illness: Patient seen for hypertension. Treated lisinopril HCTZ. Denies side effects. compliant with medication. Recent electrolytes unremarkable  patient also needs referral to allergist who treats her asthma  Hypertension History:      She denies headache, chest pain, palpitations, dyspnea with exertion, orthopnea, PND, peripheral edema, visual symptoms, neurologic problems, syncope, and side effects from treatment.  She notes no problems with any antihypertensive medication side effects.        Positive major cardiovascular risk factors include female age 54 years old or older and hypertension.  Negative major cardiovascular risk factors include non-tobacco-user status.     Allergies: 1)  Mobic (Meloxicam) 2)  Demerol (Meperidine Hcl)  Past History:  Past Medical History: Arthritis Asthma Glaucoma Kidney stones Hypertension  Review of Systems  The patient denies anorexia, fever, weight loss, weight gain, peripheral edema, prolonged cough, headaches, hemoptysis, abdominal pain, melena, hematochezia, and severe indigestion/heartburn.    Physical Exam  General:  Well-developed,well-nourished,in no acute distress; alert,appropriate and cooperative throughout examination Head:  Normocephalic and atraumatic without obvious abnormalities. No apparent alopecia or balding. Ears:  External ear exam shows no significant lesions or deformities.  Otoscopic examination reveals clear canals, tympanic membranes are intact bilaterally without bulging, retraction, inflammation or discharge. Hearing is grossly normal bilaterally. Mouth:   Oral mucosa and oropharynx without lesions or exudates.  Teeth in good repair. Neck:  No deformities, masses, or tenderness noted. Lungs:  Normal respiratory effort, chest expands symmetrically. Lungs are clear to auscultation, no crackles or wheezes. Heart:  Normal rate and regular rhythm. S1 and S2 normal without gallop, murmur, click, rub or other extra sounds.   Impression & Recommendations:  Problem # 1:  ESSENTIAL HYPERTENSION (ICD-401.9) Assessment Unchanged  Her updated medication list for this problem includes:    Lisinopril-hydrochlorothiazide 10-12.5 Mg Tabs (Lisinopril-hydrochlorothiazide) ..... One by mouth once daily  Problem # 2:  ASTHMA (ICD-493.90) referral to allergist who has helped manage her asthma. Orders: Allergy Referral  (Allergy)  Complete Medication List: 1)  Oxycodone-acetaminophen 10-325 Mg Tabs (Oxycodone-acetaminophen) .... One tab 4 times daily 2)  Celebrex 200 Mg Caps (Celecoxib) .... Once daily 3)  Sertraline Hcl 100 Mg Tabs (Sertraline hcl) .... 1/2 daily 4)  Allergy Relief 4 Mg Tabs (Chlorpheniramine maleate) .... Two to four daily otc 5)  Xalatan 0.005 % Soln (Latanoprost) .... One drop to right eye daily 6)  Womens One Daily Tabs (Multiple vitamins-minerals) .... Once daily 7)  Vitamin B-12 500 Mcg Tabs (Cyanocobalamin) .... Once daily 8)  Eye-vite Plus Lutein Caps (Multiple vitamins-minerals) .... Once daily 9)  Lisinopril-hydrochlorothiazide 10-12.5 Mg Tabs (Lisinopril-hydrochlorothiazide) .... One by mouth once daily  Hypertension Assessment/Plan:      The patient's hypertensive risk group is category B: At least one risk factor (excluding diabetes) with no target organ damage.  Today's blood pressure is 140/80.    Patient Instructions: 1)  Please schedule a follow-up appointment in 6 months .  Prescriptions: LISINOPRIL-HYDROCHLOROTHIAZIDE 10-12.5 MG TABS (LISINOPRIL-HYDROCHLOROTHIAZIDE) one by mouth once daily  #90 x 3   Entered and  Authorized by:   Evelena Peat MD   Signed by:   Evelena Peat MD on 05/15/2010   Method used:   [facility] to        [facility] [address] [phone]* (retail)       [address]       [place], [place]  [address]       Ph: [phone] or [phone]       Fax: [phone]   RxID:   [id number]

## 2010-12-28 NOTE — Miscellaneous (Signed)
Summary: Orders Update clotest  Clinical Lists Changes  Problems: Added new problem of GASTRITIS (ICD-535.50) Orders: Added new Test order of TLB-H Pylori Screen Gastric Biopsy (83013-CLOTEST) - Signed 

## 2010-12-28 NOTE — Assessment & Plan Note (Signed)
Summary: POST HOSP/YF   History of Present Illness Visit Type: Follow-up Visit Primary GI MD: Sheryn Bison MD FACP FAGA Primary Provider: Evelena Peat, MD Requesting Provider: na Chief Complaint: Post hospital visit, patient having a lot of pain in hip, no GI problems History of Present Illness:   Delightful 76 year old Caucasian female recently admitted with severe gastric outlet obstruction with peptic ulcer disease and H. pylori infection. She was hospitalized for 2 weeks with parenteral hyperalimentation and IV PPI therapy. Repeat endoscopic exam by Dr. Christella Hartigan before discharge showed marked improvement in her pyloric stenosis, and she was treated with triple drug therapy for H. pylori. She currently is doing fairly well on daily Nexium therapy but continues with mild early satiety, but no nausea and vomiting or lower bowel problems.  She has severe degenerative arthritis of her right hip which was also seen on her CT scans. She was previously on Celebrex without PPI coverage. She did have fairly good improvement in her arthritis pain with Celebrex. She denies use of other NSAIDs. There is no past history of peptic ulcer disease. She has not had previous colonoscopy. Previous surgical procedures include hysterectomy, and lumbar surgery.    GI Review of Systems      Denies abdominal pain, acid reflux, belching, bloating, chest pain, dysphagia with liquids, dysphagia with solids, heartburn, loss of appetite, nausea, vomiting, vomiting blood, weight loss, and  weight gain.        Denies anal fissure, black tarry stools, change in bowel habit, constipation, diarrhea, diverticulosis, fecal incontinence, heme positive stool, hemorrhoids, irritable bowel syndrome, jaundice, light color stool, liver problems, rectal bleeding, and  rectal pain.    Current Medications (verified): 1)  Oxycodone-Acetaminophen 10-325 Mg Tabs (Oxycodone-Acetaminophen) .... One Tab 4 Times Daily 2)  Sertraline  Hcl 100 Mg Tabs (Sertraline Hcl) .... 1/2 Daily 3)  Xalatan 0.005 % Soln (Latanoprost) .... One Drop To Right Eye Daily 4)  Nexium 40 Mg Cpdr (Esomeprazole Magnesium) .... Take One By Mouth Twice Daily  Allergies (verified): 1)  Mobic (Meloxicam) 2)  Demerol (Meperidine Hcl)  Past History:  Past medical, surgical, family and social histories (including risk factors) reviewed for relevance to current acute and chronic problems.  Past Medical History: Reviewed history from 05/15/2010 and no changes required. Arthritis Asthma Glaucoma Kidney stones Hypertension  Past Surgical History: Reviewed history from 12/02/2009 and no changes required. Hysterectomy  1970 Back surgery 1984 Kidney surgery 1970  Family History: Reviewed history from 10/03/2010 and no changes required. Family History of Prostate Cancer:Father No FH of Colon Cancer:  Social History: Reviewed history from 10/03/2010 and no changes required. Retired Married Never Smoked Alcohol use-no Regular exercise-no Daily Caffeine Use  Review of Systems       The patient complains of arthritis/joint pain, back pain, and fatigue.  The patient denies allergy/sinus, anemia, anxiety-new, blood in urine, breast changes/lumps, change in vision, confusion, cough, coughing up blood, depression-new, fainting, fever, headaches-new, hearing problems, heart murmur, heart rhythm changes, itching, menstrual pain, muscle pains/cramps, night sweats, nosebleeds, pregnancy symptoms, shortness of breath, skin rash, sleeping problems, sore throat, swelling of feet/legs, swollen lymph glands, thirst - excessive , urination - excessive , urination changes/pain, urine leakage, vision changes, and voice change.    Vital Signs:  Patient profile:   76 year old female Menstrual status:  hysterectomy Height:      59.50 inches Weight:      108.13 pounds BMI:     21.55 Pulse rate:   108 /  minute Pulse rhythm:   regular BP sitting:   130 / 72   (left arm) Cuff size:   regular  Vitals Entered By: June McMurray CMA Duncan Dull) (October 31, 2010 10:54 AM)  Physical Exam  General:  Well developed, well nourished, no acute distress. Head:  Normocephalic and atraumatic. Eyes:  PERRLA, no icterus.exam deferred to patient's ophthalmologist.   Abdomen:  Soft, nontender and nondistended. No masses, hepatosplenomegaly or hernias noted. Normal bowel sounds. Psych:  Alert and cooperative. Normal mood and affect.   Impression & Recommendations:  Problem # 1:  PEPTIC ULCER DISEASE (ICD-533.90) Assessment Improved Severe lower stenosis related to NSAID-induced peptic ulcer disease in a patient who has now been treated for H. pylori infection. We will continue Nexium with repeat endoscopy in one month's time, repeat exam for H. pylori, and perform balloon dilation of the pylorus if indicated. For now, she is to avoid NSAIDs and use p.r.n. oxycodone-acetaminophen for pain which seems to be very effective, and has not caused her constipation. Orders: EGD (EGD)  Problem # 2:  OSTEOARTHRITIS (ICD-715.90) Assessment: Deteriorated Continued severe pain in her right hip from degenerative arthritis---consider orthopedic hip replacement per Dr. Ciro Backer. For now, will avoid Cox 1 and Cox 2 inhibitor therapy. Hopefully we will be able to restart Celebrex with continual PPI usage in the future depending on her clinical course.  Problem # 3:  WEIGHT LOSS (ICD-783.21) Assessment: Improved advised frequent small feedings diet as tolerated with enteral supplementation with high protein product such as Ensure, Boost, or Carnation Instant supplements.She Does not have known lactose intolerance or any malabsorption syndrome.  Problem # 4:  ESSENTIAL HYPERTENSION (ICD-401.9) Assessment: Improved blood pressure today is 130/72 off any antihypertensive medication.  Problem # 5:  ASTHMA (ICD-493.90) Assessment: Deteriorated She will call Dr. Stevphen Rochester who  manages her asthma. I see no reason why she cannot use any type of asthma inhaler as needed.  Patient Instructions: 1)  Copy sent to : Evelena Peat, MD, Dr. Carmelina Peal. And Dr. Stevphen Rochester. 2)  Your procedure has been scheduled for 12/13/2010, please follow the seperate instructions.  3)  [facility] Patient Information Guide given to patient.  4)  Upper Endoscopy brochure given.  5)  The medication list was reviewed and reconciled.  All changed / newly prescribed medications were explained.  A complete medication list was provided to the patient / caregiver.

## 2010-12-28 NOTE — Assessment & Plan Note (Signed)
Summary: 2 wk rov/njr   Vital Signs:  Patient profile:   76 year old female Menstrual status:  hysterectomy Weight:      118 pounds Temp:     97.5 degrees F oral BP sitting:   148 / 84  (left arm) Cuff size:   regular  Vitals Entered By: Sid Falcon LPN (February 10, 2010 1:14 PM)  Serial Vital Signs/Assessments:  Time      Position  BP       Pulse  Resp  Temp     By                     132/74                         Evelena Peat MD  CC: 2 week follow-on BP, Hypertension Management   History of Present Illness: Followup hypertension. Initiated lisinopril HCTZ last visit. She's had no side effects from medication. No cough and no rash. Overall feels well. Shoulder improved following injection.  Hypertension History:      She denies headache, chest pain, palpitations, dyspnea with exertion, orthopnea, PND, peripheral edema, visual symptoms, neurologic problems, syncope, and side effects from treatment.  She notes no problems with any antihypertensive medication side effects.        Positive major cardiovascular risk factors include female age 66 years old or older and hypertension.  Negative major cardiovascular risk factors include non-tobacco-user status.     Allergies: 1)  Mobic (Meloxicam) 2)  Demerol (Meperidine Hcl)  Past History:  Past Medical History: Last updated: 12/02/2009 Arthritis Asthma Glaucoma Kidney stones UTI PMH reviewed for relevance  Review of Systems      See HPI  Physical Exam  General:  Well-developed,well-nourished,in no acute distress; alert,appropriate and cooperative throughout examination Lungs:  Normal respiratory effort, chest expands symmetrically. Lungs are clear to auscultation, no crackles or wheezes. Heart:  Normal rate and regular rhythm. S1 and S2 normal without gallop, murmur, click, rub or other extra sounds. Extremities:  no edema   Impression & Recommendations:  Problem # 1:  ESSENTIAL HYPERTENSION  (ICD-401.9) Assessment Improved check BMP with recent initiation of ACE Her updated medication list for this problem includes:    Lisinopril-hydrochlorothiazide 10-12.5 Mg Tabs (Lisinopril-hydrochlorothiazide) ..... One by mouth once daily  Orders: Venipuncture (62130) TLB-BMP (Basic Metabolic Panel-BMET) (80048-METABOL)  Complete Medication List: 1)  Oxycodone-acetaminophen 10-325 Mg Tabs (Oxycodone-acetaminophen) .... One tab 4 times daily 2)  Celebrex 200 Mg Caps (Celecoxib) .... Once daily 3)  Sertraline Hcl 100 Mg Tabs (Sertraline hcl) .... 1/2 daily 4)  Allergy Relief 4 Mg Tabs (Chlorpheniramine maleate) .... Two to four daily otc 5)  Xalatan 0.005 % Soln (Latanoprost) .... One drop to right eye daily 6)  Womens One Daily Tabs (Multiple vitamins-minerals) .... Once daily 7)  Vitamin B-12 500 Mcg Tabs (Cyanocobalamin) .... Once daily 8)  Eye-vite Plus Lutein Caps (Multiple vitamins-minerals) .... Once daily 9)  Lisinopril-hydrochlorothiazide 10-12.5 Mg Tabs (Lisinopril-hydrochlorothiazide) .... One by mouth once daily  Hypertension Assessment/Plan:      The patient's hypertensive risk group is category B: At least one risk factor (excluding diabetes) with no target organ damage.  Today's blood pressure is 148/84.    Patient Instructions: 1)  Please schedule a follow-up appointment in 3 months .

## 2010-12-28 NOTE — Letter (Signed)
Summary: New Patient letter  [facility]  [address] [place], [place] [address]   Phone: [phone]  Fax: [phone]       08/18/2010 MRN: [id number]  [name] Herman [address] [place], [place]  [address]  Dear Ms. Anderson Malta,  Welcome to the Gastroenterology Division at [facility].    You are scheduled to see Dr.  Jarold Motto on 10-03-10 at 9:30am on the 3rd floor at [facility], [address]  We ask that you try to arrive at our office 15 minutes prior to your appointment time to allow for check-in.  We would like you to complete the enclosed self-administered evaluation form prior to your visit and bring it with you on the day of your appointment.  We will review it with you.  Also, please bring a complete list of all your medications or, if you prefer, bring the medication bottles and we will list them.  Please bring your insurance card so that we may make a copy of it.  If your insurance requires a referral to see a specialist, please bring your referral form from your primary care physician.  Co-payments are due at the time of your visit and may be paid by cash, check or credit card.     Your office visit will consist of a consult with your physician (includes a physical exam), any laboratory testing he/she may order, scheduling of any necessary diagnostic testing (e.g. x-ray, ultrasound, CT-scan), and scheduling of a procedure (e.g. Endoscopy, Colonoscopy) if required.  Please allow enough time on your schedule to allow for any/all of these possibilities.    If you cannot keep your appointment, please call [phone] to cancel or reschedule prior to your appointment date.  This allows [place] the opportunity to schedule an appointment for another patient in need of care.  If you do not cancel or reschedule by 5 p.m. the business day prior to your appointment date, you will be charged a $50.00 late cancellation/no-show fee.    Thank you for choosing  [facility] for your medical needs.  We appreciate the opportunity to care for you.  Please visit [place] at our website  to learn more about our practice.                     Sincerely,                                                             The Gastroenterology Division

## 2010-12-28 NOTE — Procedures (Addendum)
Summary: Upper Endoscopy  Patient: [name] Note: All result statuses are Final unless otherwise noted.  Tests: (1) Upper Endoscopy (EGD)   EGD Upper Endoscopy       DONE (C)     [facility]     [address]     [place], [place]  [address]           ENDOSCOPY PROCEDURE REPORT           PATIENT:  [name], Herman  MR#:  [id number]     BIRTHDATE:  07/30/1935, 75 yrs. old  GENDER:  female           ENDOSCOPIST:  Vania Rea. Jarold Motto, MD, [facility]     Referred by:           PROCEDURE DATE:  12/13/2010     PROCEDURE:  EGD with biopsy, [address]     ASA CLASS:  Class II     INDICATIONS:  F/U GASTRIC ULCER AND OBSTRUCTION.           MEDICATIONS:   Fentanyl 25 mcg IV, Versed 2 mg IV     TOPICAL ANESTHETIC:           DESCRIPTION OF PROCEDURE:   After the risks benefits and     alternatives of the procedure were thoroughly explained, informed     consent was obtained.  The LB GIF-H180 [id number] endoscope was     introduced through the mouth and advanced to the second portion of     the duodenum, limited by Retained food in the stomach.   The     instrument was slowly withdrawn as the mucosa was fully examined.     <<PROCEDUREIMAGES>>           A stricture was found pyloric channel AREA DILATED WITH     ENDOSCOPE.SHALLOW CHANNEL STILL PRESENT.CLO BX. DONE.  Moderate     gastritis was found in the body and the antrum of the stomach.     Normal duodenal folds were noted.  The esophagus and     gastroesophageal junction were completely normal in appearance.     RETAINED FOOD IN STOMACH AND ESOPHAGUS.    Retroflexed views     revealed no abnormalities.    The scope was then withdrawn from     the patient and the procedure completed.           COMPLICATIONS:  None           ENDOSCOPIC IMPRESSION:     1) Stricture in the pyloric channel     2) Moderate gastritis in the body and the antrum of the stomach           3) Normal duodenal folds     4) Normal esophagus     HEALING ULCER  WITH CICATRATION.PYLORIC STENOSIS DILATED.     RECOMMENDATIONS:     1) Rx CLO if positive     1.AVOID NSAID'S     2.CONTINUE PPI     3.LOW FIBER DIET     4.OV 2 MOS.           REPEAT EXAM:  No           ______________________________     Vania Rea. Jarold Motto, MD, Clementeen Graham           CC: Evelena Peat M.D.           n.     REVISED:  12/13/2010 01:02 PM  eSIGNED:   Vania Rea. Patterson at 12/13/2010 01:02 PM           Rushie Nyhan, [id number]  Note: An exclamation mark (!) indicates a result that was not dispersed into the flowsheet. Document Creation Date: 12/13/2010 1:02 PM _______________________________________________________________________  (1) Order result status: Final Collection or observation date-time: 12/13/2010 12:39 Requested date-time:  Receipt date-time:  Reported date-time:  Referring Physician:   Ordering Physician: Sheryn Bison [phone]) Specimen Source:  Source: Launa Grill Order Number: [id number] Lab site:

## 2010-12-28 NOTE — Letter (Signed)
Summary: EGD Instructions  [facility]  [address] [place], [place] [address]   Phone: [phone]  Fax: [phone]       [name] Herman    02/20/1935    MRN: [id number]       Procedure Day Dorna Bloom: Wednesday 12/13/2010     Arrival Time: 10:30am     Procedure Time: 11:30am     Location of Procedure:                    [facility] (4th Floor)  PREPARATION FOR ENDOSCOPY   On 12/13/2010 THE DAY OF THE PROCEDURE:  1.   No solid foods, milk or milk products are allowed after midnight the night before your procedure.  2.   Do not drink anything colored red or purple.  Avoid juices with pulp.  No orange juice.  3.  You may drink clear liquids until 9:30am, which is 2 hours before your procedure.                                                                                                CLEAR LIQUIDS INCLUDE: Water Jello Ice Popsicles Tea (sugar ok, no milk/cream) Powdered fruit flavored drinks Coffee (sugar ok, no milk/cream) Gatorade Juice: apple, white grape, white cranberry  Lemonade Clear bullion, consomm, broth Carbonated beverages (any kind) Strained chicken noodle soup Hard Candy   MEDICATION INSTRUCTIONS  Unless otherwise instructed, you should take regular prescription medications with a small sip of water as early as possible the morning of your procedure.                 OTHER INSTRUCTIONS  You will need a responsible adult at least 74 years of age to accompany you and drive you home.   This person must remain in the waiting room during your procedure.  Wear loose fitting clothing that is easily removed.  Leave jewelry and other valuables at home.  However, you may wish to bring a book to read or an iPod/MP3 player to listen to music as you wait for your procedure to start.  Remove all body piercing jewelry and leave at home.  Total time from sign-in until discharge is approximately 2-3 hours.  You should go  home directly after your procedure and rest.  You can resume normal activities the day after your procedure.  The day of your procedure you should not:   Drive   Make legal decisions   Operate machinery   Drink alcohol   Return to work  You will receive specific instructions about eating, activities and medications before you leave.    The above instructions have been reviewed and explained to me by   _______________________    I fully understand and can verbalize these instructions _____________________________ Date _________

## 2010-12-28 NOTE — Progress Notes (Signed)
Summary: refill Celebrex X 1 year  Phone Note Call from Patient Call back at Home Phone [phone]   Caller: Spouse-----walk in Reason for Call: Refill Medication Summary of Call: Refill Celebrex  to [facility] mail order. Initial call taken by: Warnell Forester,  December 22, 2009 1:12 PM    Prescriptions: CELEBREX 200 MG CAPS (CELECOXIB) once daily  #90 x 3   Entered by:   Sid Falcon LPN   Authorized by:   Evelena Peat MD   Signed by:   Sid Falcon LPN on 57/84/6962   Method used:   Faxed to ...       [facility] (mail-order)       [address]       [place], [place]  [id number]       Ph: [phone]       Fax: [phone]   RxID:   [id number]

## 2010-12-28 NOTE — Assessment & Plan Note (Signed)
Summary: Hospital follow-up visit   Vital Signs:  Patient profile:   76 year old female Menstrual status:  hysterectomy Weight:      102 pounds Temp:     98.0 degrees F oral BP sitting:   140 / 70  (left arm) Cuff size:   regular  Vitals Entered By: Sid Falcon LPN (October 23, 2010 1:54 PM)  History of Present Illness: Patient seen in hospital followup. Admitted with gastric outlet obstruction secondary to peptic ulcer disease, possibly NSAID induced and with positive Helicobacter pylori test.  patient had been on Celebrex and this was discontinued. She had anemia with discharge hemoglobin 7.3. No hematemesis or recent melena.  She did have some nausea but no vomiting at this time. Nausea controlled with Phenergan. Very fatigued and weak but able to ambulate with some assistance. Drinking fluids well.  Poor appetite.  Discharged on full liquid diet for one week and then will progress gradually to solids.  She has history of hypertension and blood pressure medications held secondary to low blood pressure. Had been on losartan HCTZ.  No orthostatic symptoms at this time.  History of osteoarthritis. Takes oxycodone chronically usually one tablet twice daily and needs refills. She is aware to avoid all NSAIDS at this time.  Current Medications (verified): 1)  Oxycodone-Acetaminophen 10-325 Mg Tabs (Oxycodone-Acetaminophen) .... One Tab 4 Times Daily 2)  Sertraline Hcl 100 Mg Tabs (Sertraline Hcl) .... 1/2 Daily 3)  Allergy Relief 4 Mg Tabs (Chlorpheniramine Maleate) .... Two To Four Daily Otc 4)  Xalatan 0.005 % Soln (Latanoprost) .... One Drop To Right Eye Daily 5)  Womens One Daily  Tabs (Multiple Vitamins-Minerals) .... Once Daily 6)  Vitamin B-12 500 Mcg Tabs (Cyanocobalamin) .... Once Daily 7)  Eye-Vite Plus Lutein  Caps (Multiple Vitamins-Minerals) .... Once Daily 8)  Promethazine Hcl 25 Mg Tabs (Promethazine Hcl) .... One By Mouth Q 4-6 Hours As Needed Nausea and Vomiting 9)   Nexium 40 Mg Cpdr (Esomeprazole Magnesium) .... Take One By Mouth Twice Daily 10)  Moviprep 100 Gm  Solr (Peg-Kcl-Nacl-Nasulf-Na Asc-C) .... As Per Prep Instructions.  Allergies: 1)  Mobic (Meloxicam) 2)  Demerol (Meperidine Hcl)  Past History:  Past Medical History: Last updated: 05/15/2010 Arthritis Asthma Glaucoma Kidney stones Hypertension  Past Surgical History: Last updated: 12/02/2009 Hysterectomy  1970 Back surgery 1984 Kidney surgery 1970  Family History: Last updated: 10/03/2010 Family History of Prostate Cancer:Father No FH of Colon Cancer:  Social History: Last updated: 10/03/2010 Retired Married Never Smoked Alcohol use-no Regular exercise-no Daily Caffeine Use  Risk Factors: Exercise: no (12/02/2009)  Risk Factors: Smoking Status: never (12/02/2009)  Review of Systems       The patient complains of anorexia, weight loss, and muscle weakness.  The patient denies fever, chest pain, syncope, dyspnea on exertion, peripheral edema, headaches, hemoptysis, melena, hematochezia, severe indigestion/heartburn, and depression.    Physical Exam  General:  patient is alert thin somewhat cachectic-appearing female in no distress Mouth:  tongue is slightly dry Neck:  No deformities, masses, or tenderness noted. Lungs:  Normal respiratory effort, chest expands symmetrically. Lungs are clear to auscultation, no crackles or wheezes. Heart:  normal rate and regular rhythm.   Abdomen:  soft, non-tender, normal bowel sounds, no distention, no masses, no guarding, no rigidity, no hepatomegaly, and no splenomegaly.   Extremities:  No clubbing, cyanosis, edema, or deformity noted with normal full range of motion of all joints.   Neurologic:  alert & oriented X3 and cranial  nerves II-XII intact.     Impression & Recommendations:  Problem # 1:  PEPTIC ULCER DISEASE (ICD-533.90) continue Nexium and patient has followup with gastroenterologist early December Her  updated medication list for this problem includes:    Nexium 40 Mg Cpdr (Esomeprazole magnesium) .Marland Kitchen... Take one by mouth twice daily  Problem # 2:  ANEMIA (ICD-285.9) we'll need repeat CBC in the next one to 2 weeks.  discussed iron rich foods as diet progresses. Her updated medication list for this problem includes:    Vitamin B-12 500 Mcg Tabs (Cyanocobalamin) ..... Once daily  Problem # 3:  ESSENTIAL HYPERTENSION (ICD-401.9) continue hold blood pressure medications at this time The following medications were removed from the medication list:    Losartan Potassium-hctz 50-12.5 Mg Tabs (Losartan potassium-hctz) ..... One by mouth once daily  Problem # 4:  OSTEOARTHRITIS (ICD-715.90) patient aware to avoid any further nonsteroidals.  Refilled pain med which she has been on for several years. The following medications were removed from the medication list:    Celebrex 200 Mg Caps (Celecoxib) ..... Once daily Her updated medication list for this problem includes:    Oxycodone-acetaminophen 10-325 Mg Tabs (Oxycodone-acetaminophen) ..... One tab 4 times daily  Complete Medication List: 1)  Oxycodone-acetaminophen 10-325 Mg Tabs (Oxycodone-acetaminophen) .... One tab 4 times daily 2)  Sertraline Hcl 100 Mg Tabs (Sertraline hcl) .... 1/2 daily 3)  Allergy Relief 4 Mg Tabs (Chlorpheniramine maleate) .... Two to four daily otc 4)  Xalatan 0.005 % Soln (Latanoprost) .... One drop to right eye daily 5)  Womens One Daily Tabs (Multiple vitamins-minerals) .... Once daily 6)  Vitamin B-12 500 Mcg Tabs (Cyanocobalamin) .... Once daily 7)  Eye-vite Plus Lutein Caps (Multiple vitamins-minerals) .... Once daily 8)  Promethazine Hcl 25 Mg Tabs (Promethazine hcl) .... One by mouth q 4-6 hours as needed nausea and vomiting 9)  Nexium 40 Mg Cpdr (Esomeprazole magnesium) .... Take one by mouth twice daily 10)  Moviprep 100 Gm Solr (Peg-kcl-nacl-nasulf-na asc-c) .... As per prep instructions.  Patient  Instructions: 1)  Please schedule a follow-up appointment in 1 month.  Prescriptions: OXYCODONE-ACETAMINOPHEN 10-325 MG TABS (OXYCODONE-ACETAMINOPHEN) one tab 4 times daily  #120 x 0   Entered and Authorized by:   Evelena Peat MD   Signed by:   Evelena Peat MD on 10/23/2010   Method used:   Print then Give to Patient   RxID:   [id number]    Orders Added: 1)  Est. Patient Level IV [84132]

## 2010-12-28 NOTE — Miscellaneous (Signed)
Summary: Orders Update/clotest  Clinical Lists Changes  Orders: Added new Test order of TLB-H Pylori Screen Gastric Biopsy (83013-CLOTEST) - Signed 

## 2010-12-28 NOTE — Progress Notes (Signed)
Summary: Back date Opthalmology OV  Phone Note Call from Patient   Summary of Call: pt husband calls stating wife had ov with Dr Marti Sleigh at [facility] in April and [employer] insurance refused payment because she failed to get a referral. pt is requesting we back date a referral for her/ Initial call taken by: Willy Eddy, LPN,  Apr 05, 2010 3:21 PM  Follow-up for Phone Call        My understanding is that we are unable to "back date" any referrals.  She will have to take this up with her insurance company. Follow-up by: Evelena Peat MD,  Apr 06, 2010 9:12 AM  Additional Follow-up for Phone Call Additional follow up Details #1::        Pt informed, they were instructed to call [facility] and find out what they can do for her situation. Additional Follow-up by: Sid Falcon LPN,  Apr 06, 2010 1:45 PM

## 2010-12-28 NOTE — Progress Notes (Signed)
Summary: refill Oxycodone  Phone Note Refill Request Call back at Home Phone [phone] Message from:  Patient--live call  Refills Requested: Medication #1:  OXYCODONE-ACETAMINOPHEN 10-325 MG TABS one tab 4 times daily call pt when ready.  Initial call taken by: Warnell Forester,  Apr 04, 2010 10:01 AM  Follow-up for Phone Call        Last refill #120 done 01/27/2010 Sid Falcon LPN  Apr 04, 2010 11:38 AM Will refill Follow-up by: Evelena Peat MD,  Apr 05, 2010 8:59 AM  Additional Follow-up for Phone Call Additional follow up Details #1::        Pt informed Additional Follow-up by: Sid Falcon LPN,  Apr 05, 2010 9:07 AM    Prescriptions: OXYCODONE-ACETAMINOPHEN 10-325 MG TABS (OXYCODONE-ACETAMINOPHEN) one tab 4 times daily  #120 x 0   Entered and Authorized by:   Evelena Peat MD   Signed by:   Evelena Peat MD on 04/05/2010   Method used:   Print then Give to Patient   RxID:   [id number]

## 2010-12-28 NOTE — Procedures (Signed)
Summary: Upper Endoscopy  Patient: [name] Herman Note: All result statuses are Final unless otherwise noted.  Tests: (1) Upper Endoscopy (EGD)   EGD Upper Endoscopy       [facility]      [facility]     [address]     [place], [place]  [address]           ENDOSCOPY PROCEDURE REPORT           PATIENT:  [name] Herman, [name] Herman  MR#:  [id number]     BIRTHDATE:  1934-11-28, 75 yrs. old  GENDER:  female           ENDOSCOPIST:  Vania Rea. Jarold Motto, MD, [facility]     Referred by:  Evelena Peat, M.D.           PROCEDURE DATE:  10/04/2010     PROCEDURE:  EGD with biopsy, [address]     ASA CLASS:  Class II     INDICATIONS:  GERD, nausea and vomiting           MEDICATIONS:   Fentanyl 37.5 mcg IV, Versed 4 mg IV     TOPICAL ANESTHETIC:  Exactacain Spray           DESCRIPTION OF PROCEDURE:   After the risks benefits and     alternatives of the procedure were thoroughly explained, informed     consent was obtained.  The LB GIF-H180 [id number] endoscope was     introduced through the mouth and advanced to the pylorus, retained     food and debris.  The instrument was slowly withdrawn as the     mucosa was fully examined.     <<PROCEDUREIMAGES>>           pyloric stenosis.  Multiple ulcers were found at the pylorus.     COULD NOT PASS PYLORIC CHANNEL.TOTALLY OBSTRUCTED.BLEEDING.EXAM OF     STOMACH INCOMPLETE PER LARGE AMT FOOD.CLO BX. DONE.  Esophagitis     was found. S3EVERE GERD ESOPHAGITIS WITH EROSIONS AND FRIABILITY.     Retroflexed views revealed unable to retroflex.    The scope was     then withdrawn from the patient and the procedure completed.           COMPLICATIONS:  None           ENDOSCOPIC IMPRESSION:     1) Pyloric stenosis     2) Ulcers, multiple at the pylorus     3) Esophagitis     4) Unable to retroflex     NSAID INDUCED ULCERS AND OUTLET OBSTRUCTION.     RECOMMENDATIONS:     1) Admit to hospital           REPEAT EXAM:  No           ______________________________  Vania Rea. Jarold Motto, MD, [facility]           CC:           n.     eSIGNED:   Vania Rea. Patterson at 10/04/2010 08:40 AM           Rushie Nyhan, [phone]  Note: An exclamation mark (!) indicates a result that was not dispersed into the flowsheet. Document Creation Date: 10/04/2010 8:40 AM _______________________________________________________________________  (1) Order result status: Final Collection or observation date-time: 10/04/2010 08:25 Requested date-time:  Receipt date-time:  Reported date-time:  Referring Physician:   Ordering Physician: Sheryn Bison [phone]) Specimen Source:  Source: Launa Grill Order Number: [id number] Lab site:  Appended Document: Upper Endoscopy copy Amy and Dr. Deveron Furlong Dr. she is currently hospitalized.

## 2010-12-28 NOTE — Progress Notes (Signed)
  Phone Note Call from Patient   Caller: Patient Call For: [name] Peat MD Summary of Call: Between 140/80 to 160/90 are her BPs. Initial call taken by: [facility] CMA AAMA,  December 11, 2010 1:44 PM  Follow-up for Phone Call        let's have her follow up within one month to reassess BP .  May need to add something back at that time. Follow-up by: [name] Peat MD,  December 11, 2010 2:04 PM  Additional Follow-up for Phone Call Additional follow up Details #1::        Notified pt. Additional Follow-up by: Lynann Beaver CMA AAMA,  December 11, 2010 2:12 PM

## 2010-12-28 NOTE — Assessment & Plan Note (Signed)
Summary: diarrhea/melena?/dm   Vital Signs:  Patient profile:   76 year old female Menstrual status:  hysterectomy Weight:      107 pounds Temp:     98.2 degrees F oral BP sitting:   160 / 88  (left arm) Cuff size:   regular  Vitals Entered By: Sid Falcon LPN (August 17, 2010 3:11 PM)  History of Present Illness: Patient seen with onset around 3 AM today of diffuse abdominal cramps and one episode of nonbloody diarrhea. None since then. No current abdominal pain. Denies any melena but had some bright blood with wiping. No recent constipation. Denies any nausea or vomiting. Took no medications. No prior history of colonoscopy screening. No ill contacts. Has not been drinking many fluids today. Denies orthostatic symptoms.  Has some weight loss today noted since last visit.  appetite fair.  Allergies: 1)  Mobic (Meloxicam) 2)  Demerol (Meperidine Hcl)  Past History:  Past Medical History: Last updated: 05/15/2010 Arthritis Asthma Glaucoma Kidney stones Hypertension  Past Surgical History: Last updated: 12/02/2009 Hysterectomy  1970 Back surgery 1984 Kidney surgery 1970  Social History: Last updated: 12/02/2009 Retired Married Never Smoked Alcohol use-no Regular exercise-no  Risk Factors: Exercise: no (12/02/2009)  Risk Factors: Smoking Status: never (12/02/2009) PMH-FH-SH reviewed for relevance  Review of Systems       The patient complains of weight loss and hematochezia.  The patient denies anorexia, fever, chest pain, syncope, dyspnea on exertion, peripheral edema, melena, and severe indigestion/heartburn.    Physical Exam  General:  Well-developed,well-nourished,in no acute distress; alert,appropriate and cooperative throughout examination Mouth:  Oral mucosa and oropharynx without lesions or exudates.  Teeth in good repair. Lungs:  Normal respiratory effort, chest expands symmetrically. Lungs are clear to auscultation, no crackles or  wheezes. Heart:  normal rate and regular rhythm.   Abdomen:  soft, non-tender, normal bowel sounds, no distention, no masses, no guarding, no rigidity, no hepatomegaly, and no splenomegaly.   Rectal:  no visible external hemorrhoids. No visible fissures. Digital exam reveals no masses. Anoscopy reveals that she has some internal hemorrhoids but these do not appear to be actively bleeding. She has some maroon to slightly brighter color blood which can be seen and appears becoming proximal to the furthest advance of the anoscope   Impression & Recommendations:  Problem # 1:  DIARRHEA (ICD-787.91)  Orders: Gastroenterology Referral (GI)  Problem # 2:  HEMATOCHEZIA (ICD-578.1) Assessment: New check CBC and GI referral, esp with recent weight loss. Orders: TLB-CBC Platelet - w/Differential (85025-CBCD) Venipuncture (16109) Specimen Handling (60454) Gastroenterology Referral (GI)  Complete Medication List: 1)  Oxycodone-acetaminophen 10-325 Mg Tabs (Oxycodone-acetaminophen) .... One tab 4 times daily 2)  Celebrex 200 Mg Caps (Celecoxib) .... Once daily 3)  Sertraline Hcl 100 Mg Tabs (Sertraline hcl) .... 1/2 daily 4)  Allergy Relief 4 Mg Tabs (Chlorpheniramine maleate) .... Two to four daily otc 5)  Xalatan 0.005 % Soln (Latanoprost) .... One drop to right eye daily 6)  Womens One Daily Tabs (Multiple vitamins-minerals) .... Once daily 7)  Vitamin B-12 500 Mcg Tabs (Cyanocobalamin) .... Once daily 8)  Eye-vite Plus Lutein Caps (Multiple vitamins-minerals) .... Once daily 9)  Lisinopril-hydrochlorothiazide 10-12.5 Mg Tabs (Lisinopril-hydrochlorothiazide) .... One by mouth once daily  Patient Instructions: 1)  avoid any aspirin for now 2)  Drink lots of fluids 3)  Follow up immediately for any increased rectal bleeding, abdominal pain, or dizziness

## 2010-12-28 NOTE — Progress Notes (Signed)
Summary: refill Celebrex #30, waiting for mail order  Phone Note Call from Patient Call back at Home Phone [phone]   Caller: [name] Herman call Reason for Call: Refill Medication Summary of Call: Needs temporary of refill Celebrex. [facility]. Waiting on mail order to come in. Initial call taken by: Warnell Forester,  January 04, 2010 12:55 PM  Follow-up for Phone Call        Rx filled as requested #30, RF 0, pt informed Follow-up by: Sid Falcon LPN,  January 04, 2010 2:33 PM    Prescriptions: CELEBREX 200 MG CAPS (CELECOXIB) once daily  #30 x 0   Entered by:   Sid Falcon LPN   Authorized by:   Evelena Peat MD   Signed by:   Sid Falcon LPN on 14/78/2956   Method used:   [facility]        [facility]  [address] [phone]* (retail)       [address]       [place], [place]  [address]       Ph: [phone] or [phone]       Fax: [phone]   RxID:   [id number]

## 2010-12-28 NOTE — Miscellaneous (Signed)
Summary: Orders Update-Clotest  Clinical Lists Changes  Problems: Added new problem of ULCERATIVE COLITIS, LEFT SIDED (ICD-556.5) Orders: Added new Test order of TLB-H Pylori Screen Gastric Biopsy (83013-CLOTEST) - Signed

## 2010-12-28 NOTE — Assessment & Plan Note (Signed)
Summary: 1 month fup/cjr   Vital Signs:  Patient profile:   76 year old female Menstrual status:  hysterectomy Weight:      106 pounds Temp:     98.3 degrees F oral BP sitting:   158 / 80  (left arm) Cuff size:   regular  Vitals Entered By: Sid Falcon LPN (November 22, 2010 1:38 PM)  History of Present Illness: Follow up.  Recent PUD with pyloric inflammation and H Pylori treated. overall much improved.  No abd pain and appetite improved. Some weight gain with several small meals per day. No melena or hematemesis.  Recent anemia with hosp d/c hgb 7.3. No orthostatic symptoms.  Ongoing R hip pain followed by ortho.  Oxycodone helps but still poor control at times.  No constipation. No celebrex use.  Hx hypertension with recent discontinuation of BP meds with her substantial weight loss, anemia, and inactivity with PUD.  Allergies: 1)  Mobic (Meloxicam) 2)  Demerol (Meperidine Hcl)  Past History:  Past Medical History: Last updated: 05/15/2010 Arthritis Asthma Glaucoma Kidney stones Hypertension  Past Surgical History: Last updated: 12/02/2009 Hysterectomy  1970 Back surgery 1984 Kidney surgery 1970  Family History: Last updated: 10/03/2010 Family History of Prostate Cancer:Father No FH of Colon Cancer:  Social History: Last updated: 10/03/2010 Retired Married Never Smoked Alcohol use-no Regular exercise-no Daily Caffeine Use  Risk Factors: Exercise: no (12/02/2009)  Risk Factors: Smoking Status: never (12/02/2009) PMH-FH-SH reviewed for relevance  Review of Systems  The patient denies anorexia, fever, weight loss, chest pain, syncope, dyspnea on exertion, peripheral edema, abdominal pain, melena, hematochezia, severe indigestion/heartburn, and depression.    Physical Exam  General:  Well-developed,well-nourished,in no acute distress; alert,appropriate and cooperative throughout examination Mouth:  Oral mucosa and oropharynx without lesions  or exudates.  Teeth in good repair. Neck:  No deformities, masses, or tenderness noted. Lungs:  Normal respiratory effort, chest expands symmetrically. Lungs are clear to auscultation, no crackles or wheezes. Heart:  normal rate and regular rhythm.   Abdomen:  soft and non-tender.   Extremities:  trace edema feet and lower legs bilateral. Psych:  normally interactive, good eye contact, not anxious appearing, and not depressed appearing.     Impression & Recommendations:  Problem # 1:  PEPTIC ULCER DISEASE (ICD-533.90) continued f/u with Dr Jarold Motto as scheduled. Her updated medication list for this problem includes:    Nexium 40 Mg Cpdr (Esomeprazole magnesium) .Marland Kitchen... Take one by mouth twice daily  Problem # 2:  ANEMIA (ICD-285.9) repeat CBC. Orders: Specimen Handling (27253) Venipuncture (66440) TLB-CBC Platelet - w/Differential (85025-CBCD)  Problem # 3:  ESSENTIAL HYPERTENSION (ICD-401.9) back up some today.  Monitor closely at home and if consistently over 150/90 add back meds. Will probably need to add back eventually as she gains her weight back.  Problem # 4:  OSTEOARTHRITIS (ICD-715.90) refilled oxycodone today. Her updated medication list for this problem includes:    Oxycodone-acetaminophen 10-325 Mg Tabs (Oxycodone-acetaminophen) ..... One tab 4 times daily  Complete Medication List: 1)  Oxycodone-acetaminophen 10-325 Mg Tabs (Oxycodone-acetaminophen) .... One tab 4 times daily 2)  Sertraline Hcl 100 Mg Tabs (Sertraline hcl) .... 1/2 daily 3)  Xalatan 0.005 % Soln (Latanoprost) .... One drop to right eye daily 4)  Nexium 40 Mg Cpdr (Esomeprazole magnesium) .... Take one by mouth twice daily  Patient Instructions: 1)  Check your  Blood Pressure regularly . If it is above:150/90   you should make an appointment. 2)  Please schedule a follow-up  appointment in 3 months .  Prescriptions: OXYCODONE-ACETAMINOPHEN 10-325 MG TABS (OXYCODONE-ACETAMINOPHEN) one tab 4 times  daily  #120 x 0   Entered by:   Sid Falcon LPN   Authorized by:   Evelena Peat MD   Signed by:   Sid Falcon LPN on 16/08/9603   Method used:   Print then Give to Patient   RxID:   [id number]    Orders Added: 1)  Specimen Handling [99000] 2)  Venipuncture [21308] 3)  TLB-CBC Platelet - w/Differential [85025-CBCD] 4)  Est. Patient Level IV [65784]

## 2010-12-28 NOTE — Miscellaneous (Signed)
Summary: Orders Update   Clinical Lists Changes  Orders: Added new Referral order of Radiology Referral (Radiology) - Signed 

## 2010-12-28 NOTE — Assessment & Plan Note (Signed)
Summary: gi and back//ccm   Vital Signs:  Patient profile:   76 year old female Menstrual status:  hysterectomy Temp:     98.1 degrees F oral BP sitting:   130 / 78  (left arm) Cuff size:   regular  Vitals Entered By: Sid Falcon LPN (September 29, 2010 11:50 AM)  History of Present Illness: Patient seen with midepigastric pain nausea and a couple episodes of vomiting since 2 days ago. Decreasing appetite.  Patient presented here with one episode of bloody stool back in September. GI referral pending. Mild normocytic anemia. Initial white blood count elevated to 17,000 range then back to 10,000. Rechecked a few days ago and back up. Patient has persistent dry cough with recent ACE inhibitor discontinued. No fever or chills.  Reportedly saw pulmonologist few months ago with unremarkable CXR.  Now presents with back pain mostly mid thoracic area radiating toward midepigastric area. She relates some weight loss with baseline around 122 pounds to currently around 106 pounds. 2 episodes of vomiting past 2 days. No hematemesis. No diarrhea.  No hx of PUD.  Allergies: 1)  Mobic (Meloxicam) 2)  Demerol (Meperidine Hcl)  Past History:  Past Medical History: Last updated: 05/15/2010 Arthritis Asthma Glaucoma Kidney stones Hypertension  Past Surgical History: Last updated: 12/02/2009 Hysterectomy  1970 Back surgery 1984 Kidney surgery 1970  Social History: Last updated: 12/02/2009 Retired Married Never Smoked Alcohol use-no Regular exercise-no  Risk Factors: Exercise: no (12/02/2009)  Risk Factors: Smoking Status: never (12/02/2009) PMH-FH-SH reviewed for relevance  Review of Systems       The patient complains of anorexia, weight loss, and abdominal pain.  The patient denies fever, hoarseness, chest pain, syncope, dyspnea on exertion, peripheral edema, headaches, hemoptysis, melena, hematochezia, severe indigestion/heartburn, incontinence, suspicious skin lesions, and  enlarged lymph nodes.    Physical Exam  General:  Well-developed,well-nourished,in no acute distress; alert,appropriate and cooperative throughout examination Head:  Normocephalic and atraumatic without obvious abnormalities. No apparent alopecia or balding. Ears:  External ear exam shows no significant lesions or deformities.  Otoscopic examination reveals clear canals, tympanic membranes are intact bilaterally without bulging, retraction, inflammation or discharge. Hearing is grossly normal bilaterally. Mouth:  Oral mucosa and oropharynx without lesions or exudates.  Teeth in good repair. Neck:  No deformities, masses, or tenderness noted. Lungs:  Normal respiratory effort, chest expands symmetrically. Lungs are clear to auscultation, no crackles or wheezes. Heart:  normal rate and regular rhythm.   Abdomen:  nondistended. Normal bowel sounds. Soft with mild tenderness left upper quadrant and right upper quadrant. No guarding or rebound. Extremities:  no edema Neurologic:  alert & oriented X3 and cranial nerves II-XII intact.   Skin:  no rashes and no suspicious lesions.   Cervical Nodes:  No lymphadenopathy noted   Impression & Recommendations:  Problem # 1:  ABDOMINAL PAIN, EPIGASTRIC (ICD-789.06) pt presents with weight loss, intermittent nausea and vomiting of just 2 days duration of ? significance, and leukocytosis.  No evid for SBO. Orders: Radiology Referral (Radiology)  Problem # 2:  NAUSEA WITH VOMITING (ICD-787.01) recent onset as above. Orders: Radiology Referral (Radiology) Venipuncture [phone]) Specimen Handling (95638) TLB-BMP (Basic Metabolic Panel-BMET) (80048-METABOL) TLB-Hepatic/Liver Function Pnl (80076-HEPATIC) TLB-Lipase (83690-LIPASE)  Problem # 3:  WEIGHT LOSS (ICD-783.21) GI referral pending.  CT abd and pelvis. Orders: Radiology Referral (Radiology) Venipuncture [phone]) Specimen Handling (32951) TLB-Sedimentation Rate (ESR) (85652-ESR)  Problem #  4:  ANEMIA (ICD-285.9) ?chronic. Her updated medication list for this problem includes:  Vitamin B-12 500 Mcg Tabs (Cyanocobalamin) ..... Once daily  Orders: Radiology Referral (Radiology)  Problem # 5:  LEUKOCYTOSIS (ICD-288.60) ?etiology.  No fever, chills, or recent dysuria.   See abnormal UA results.  Urine cx and start Cipro pending results. Orders: UA Dipstick w/o Micro (automated)  (81003) T-Culture, Urine ([id number])  Complete Medication List: 1)  Oxycodone-acetaminophen 10-325 Mg Tabs (Oxycodone-acetaminophen) .... One tab 4 times daily 2)  Celebrex 200 Mg Caps (Celecoxib) .... Once daily 3)  Sertraline Hcl 100 Mg Tabs (Sertraline hcl) .... 1/2 daily 4)  Allergy Relief 4 Mg Tabs (Chlorpheniramine maleate) .... Two to four daily otc 5)  Xalatan 0.005 % Soln (Latanoprost) .... One drop to right eye daily 6)  Womens One Daily Tabs (Multiple vitamins-minerals) .... Once daily 7)  Vitamin B-12 500 Mcg Tabs (Cyanocobalamin) .... Once daily 8)  Eye-vite Plus Lutein Caps (Multiple vitamins-minerals) .... Once daily 9)  Losartan Potassium-hctz 50-12.5 Mg Tabs (Losartan potassium-hctz) .... One by mouth once daily 10)  Promethazine Hcl 25 Mg Tabs (Promethazine hcl) .... One by mouth q 4-6 hours as needed nausea and vomiting 11)  Ciprofloxacin Hcl 250 Mg Tabs (Ciprofloxacin hcl) .... One by mouth two times a day for 7 days Prescriptions: CIPROFLOXACIN HCL 250 MG TABS (CIPROFLOXACIN HCL) one by mouth two times a day for 7 days  #14 x 0   Entered and Authorized by:   Evelena Peat MD   Signed by:   Evelena Peat MD on 09/29/2010   Method used:   Electronically to        [facility] [address] [phone][facility] (retail)       [address]       [place], [place]  [address]       Ph: [phone] or [phone]       Fax: [phone]   RxID:   [id number] PROMETHAZINE HCL 25 MG TABS (PROMETHAZINE HCL) one by mouth q 4-6 hours as needed nausea and vomiting  #12 x  0   Entered and Authorized by:   Evelena Peat MD   Signed by:   Evelena Peat MD on 09/29/2010   Method used:   [facility] to        [facility] [address] [phone]* (retail)       [address]       [place], [place]  [address]       Ph: [phone] or [phone]       Fax: [phone]   RxID:   [id number]    Orders Added: 1)  Radiology Referral [Radiology] 2)  Venipuncture [77412] 3)  Specimen Handling [99000] 4)  UA Dipstick w/o Micro (automated)  [81003] 5)  Est. Patient Level IV [87867] 6)  TLB-BMP (Basic Metabolic Panel-BMET) [80048-METABOL] 7)  TLB-Hepatic/Liver Function Pnl [80076-HEPATIC] 8)  TLB-Lipase [83690-LIPASE] 9)  TLB-Sedimentation Rate (ESR) [85652-ESR] 10)  T-Culture, Urine [[id number]]    Laboratory Results   Urine Tests  Date/Time Recieved: September 29, 2010 1:06 PM  Date/Time Reported: September 29, 2010 1:06 PM   Routine Urinalysis   Color: yellow Appearance: Clear Glucose: negative   (Normal Range: Negative) Bilirubin: negative   (Normal Range: Negative) Ketone: trace (5)   (Normal Range: Negative) Spec. Gravity: 1.025   (Normal Range: 1.003-1.035) Blood: 1+   (Normal Range: Negative) pH: 5.5   (Normal Range: 5.0-8.0) Protein: trace   (Normal Range: Negative) Urobilinogen: 1.0   (  Normal Range: 0-1) Nitrite: positive   (Normal Range: Negative) Leukocyte Esterace: negative   (Normal Range: Negative)    Comments: Wynona Canes, CMA  September 29, 2010 1:06 PM

## 2011-01-02 ENCOUNTER — Other Ambulatory Visit: Payer: Self-pay | Admitting: Family Medicine

## 2011-01-02 DIAGNOSIS — G894 Chronic pain syndrome: Secondary | ICD-10-CM

## 2011-01-02 NOTE — Telephone Encounter (Signed)
Last filled #120, 0 refills on 11/22/10

## 2011-01-02 NOTE — Telephone Encounter (Signed)
Pt script for oxycodone 10-325mg  # 120. Pls call pt when ready for pick up.

## 2011-01-03 MED ORDER — OXYCODONE-ACETAMINOPHEN 10-325 MG PO TABS: 1.0000 | ORAL_TABLET | Freq: Four times a day (QID) | ORAL | Status: DC | PRN

## 2011-01-03 NOTE — Telephone Encounter (Signed)
Unknown Jim informed Rx is ready, he will pick-up

## 2011-01-03 NOTE — Telephone Encounter (Signed)
Will refill.

## 2011-01-08 ENCOUNTER — Telehealth (INDEPENDENT_AMBULATORY_CARE_PROVIDER_SITE_OTHER): Payer: Self-pay | Admitting: *Deleted

## 2011-01-10 ENCOUNTER — Other Ambulatory Visit (INDEPENDENT_AMBULATORY_CARE_PROVIDER_SITE_OTHER): Payer: Medicare PPO | Admitting: Family Medicine

## 2011-01-10 DIAGNOSIS — D649 Anemia, unspecified: Secondary | ICD-10-CM

## 2011-01-11 LAB — CBC WITH DIFFERENTIAL/PLATELET
Basophils Relative: 1.8 % (ref 0.0–3.0)
Eosinophils Absolute: 0.2 10*3/uL (ref 0.0–0.7)
HCT: 29.6 % — ABNORMAL LOW (ref 36.0–46.0)
Hemoglobin: 9.8 g/dL — ABNORMAL LOW (ref 12.0–15.0)
MCHC: 33.2 g/dL (ref 30.0–36.0)
MCV: 80.5 fl (ref 78.0–100.0)
Monocytes Absolute: 0.5 10*3/uL (ref 0.1–1.0)
Neutro Abs: 5.9 10*3/uL (ref 1.4–7.7)
RBC: 3.68 Mil/uL — ABNORMAL LOW (ref 3.87–5.11)

## 2011-01-17 NOTE — Progress Notes (Signed)
----   Converted from flag ---- ---- 12/29/2010 8:42 AM, Graciella Freer RN wrote: Need to schedule patient for f/u ov per 12/13/10 endo-f/u gastritis, healing ulcer, pyloric stenosis dilation ------------------------------ Spoke with patient's husband to verify appointment with Dr Jarold Motto on 01/25/11 @ 1130am.

## 2011-01-25 ENCOUNTER — Encounter: Payer: Self-pay | Admitting: Gastroenterology

## 2011-01-25 ENCOUNTER — Encounter (INDEPENDENT_AMBULATORY_CARE_PROVIDER_SITE_OTHER): Payer: Self-pay | Admitting: *Deleted

## 2011-01-25 ENCOUNTER — Ambulatory Visit (INDEPENDENT_AMBULATORY_CARE_PROVIDER_SITE_OTHER): Payer: Medicare PPO | Admitting: Gastroenterology

## 2011-01-25 ENCOUNTER — Other Ambulatory Visit: Payer: Medicare PPO

## 2011-01-25 DIAGNOSIS — K3184 Gastroparesis: Secondary | ICD-10-CM

## 2011-01-25 DIAGNOSIS — D509 Iron deficiency anemia, unspecified: Secondary | ICD-10-CM

## 2011-01-25 DIAGNOSIS — K279 Peptic ulcer, site unspecified, unspecified as acute or chronic, without hemorrhage or perforation: Secondary | ICD-10-CM

## 2011-02-01 ENCOUNTER — Encounter (INDEPENDENT_AMBULATORY_CARE_PROVIDER_SITE_OTHER): Payer: Self-pay | Admitting: *Deleted

## 2011-02-01 ENCOUNTER — Other Ambulatory Visit: Payer: Self-pay | Admitting: Gastroenterology

## 2011-02-01 ENCOUNTER — Other Ambulatory Visit: Payer: Medicare PPO

## 2011-02-01 DIAGNOSIS — K2970 Gastritis, unspecified, without bleeding: Secondary | ICD-10-CM

## 2011-02-01 DIAGNOSIS — K2990 Gastroduodenitis, unspecified, without bleeding: Secondary | ICD-10-CM

## 2011-02-01 DIAGNOSIS — D649 Anemia, unspecified: Secondary | ICD-10-CM

## 2011-02-01 DIAGNOSIS — K279 Peptic ulcer, site unspecified, unspecified as acute or chronic, without hemorrhage or perforation: Secondary | ICD-10-CM

## 2011-02-01 NOTE — Letter (Signed)
Summary: [facility]: Immunoassay Fecal Occult Blood (iFOB) Order Form  [facility]  [address] [place], [place] [address]   Phone: [phone]  Fax: [phone]      [facility]: Immunoassay Fecal Occult Blood (iFOB) Order Form   January 25, 2011 MRN: [id number]   [name] Herman December 24, 1934   Physicican Name: Sheryn Bison Diagnosis Code: 563.6/533.90/280.9     [name] CMA (AAMA)

## 2011-02-01 NOTE — Assessment & Plan Note (Signed)
Summary: EGD Follow up   History of Present Illness Visit Type: Follow-up Visit Primary GI MD: Sheryn Bison MD FACP FAGA Primary Provider: Evelena Peat, MD Requesting Provider: na Chief Complaint: EGD follow up, No GI Complaints History of Present Illness:   Delightful 76 year old Caucasian female status post hospitalization for gastric outlet obstruction secondary to poor channel ulceration and H. pylori infection which was successfully eradicated with triple drug therapy. Followup endoscopy showed no evidence of H. pylori, but she had mild aortic stenosis that was dilated with the endoscope. She has been maintained on Nexium 40 mg a day is currently asymptomatic except for mild early satiety. Her hemoglobin has risen to 10 on a time since she cannot tolerate oral iron therapy. She has no nausea and vomiting, but eats 5-6 small meals a day. She continues with severe pain in her left hip and is under the care of Dr. Carmelina Peal with apparently planned hip replacement. She is not on NSAIDs at this time but uses p.r.n. oxycodone-acetaminophen.   GI Review of Systems      Denies abdominal pain, acid reflux, belching, bloating, chest pain, dysphagia with liquids, dysphagia with solids, heartburn, loss of appetite, nausea, vomiting, vomiting blood, weight loss, and  weight gain.        Denies anal fissure, black tarry stools, change in bowel habit, constipation, diarrhea, diverticulosis, fecal incontinence, heme positive stool, hemorrhoids, irritable bowel syndrome, jaundice, light color stool, liver problems, rectal bleeding, and  rectal pain.    Current Medications (verified): 1)  Oxycodone-Acetaminophen 10-325 Mg Tabs (Oxycodone-Acetaminophen) .... One Tab 4 Times Daily 2)  Sertraline Hcl 100 Mg Tabs (Sertraline Hcl) .... 1/2 Daily 3)  Xalatan 0.005 % Soln (Latanoprost) .... One Drop To Right Eye Daily 4)  Nexium 40 Mg Cpdr (Esomeprazole Magnesium) .... Take One By Mouth Twice  Daily 5)  Glucosamine Chondroitin Complx  Caps (Glucosamine-Chondroit-Vit C-Mn) .Marland Kitchen.. 1 By Mouth Two Times A Day  Allergies (verified): 1)  Mobic (Meloxicam) 2)  Demerol (Meperidine Hcl)  Past History:  Family History: Last updated: 10/03/2010 Family History of Prostate Cancer:Father No FH of Colon Cancer:  Social History: Last updated: 10/03/2010 Retired Married Never Smoked Alcohol use-no Regular exercise-no Daily Caffeine Use  Past medical, surgical, family and social histories (including risk factors) reviewed for relevance to current acute and chronic problems.  Past Medical History: Reviewed history from 05/15/2010 and no changes required. Arthritis Asthma Glaucoma Kidney stones Hypertension  Past Surgical History: Reviewed history from 12/02/2009 and no changes required. Hysterectomy  1970 Back surgery 1984 Kidney surgery 1970  Family History: Reviewed history from 10/03/2010 and no changes required. Family History of Prostate Cancer:Father No FH of Colon Cancer:  Social History: Reviewed history from 10/03/2010 and no changes required. Retired Married Never Smoked Alcohol use-no Regular exercise-no Daily Caffeine Use  Review of Systems MS:  Complains of joint pain / LOM, joint stiffness, and low back pain; denies joint swelling, joint deformity, muscle weakness, muscle cramps, muscle atrophy, leg pain at night, leg pain with exertion, and shoulder pain / LOM hand / wrist pain (CTS); difficulty ambulating because of hip discomfort.Marland Kitchen Psych:  Denies depression, anxiety, memory loss, suicidal ideation, hallucinations, paranoia, phobia, and confusion; she is on sertraline 100 mg a half a tablet a day. She also takes glucosamine by mouth..  Vital Signs:  Patient profile:   76 year old female Menstrual status:  hysterectomy Height:      59.50 inches Weight:      102 pounds  BMI:     20.33 BSA:     1.39 Pulse rate:   100 / minute Pulse rhythm:    irregular BP sitting:   142 / 72  (left arm)  Vitals Entered By: Merri Ray CMA Duncan Dull) (January 25, 2011 11:21 AM)  Physical Exam  General:  Well developed, well nourished, no acute distress. Head:  Normocephalic and atraumatic. Eyes:  PERRLA, no icterus. Abdomen:  Soft, nontender and nondistended. No masses, hepatosplenomegaly or hernias noted. Normal bowel sounds.Positive succussion splash and epigastric area but no masses or tenderness. Psych:  Alert and cooperative. Normal mood and affect.   Impression & Recommendations:  Problem # 1:  PEPTIC ULCER DISEASE (ICD-533.90) Assessment Improved we will continue Nexium 40 mg a day with a step 3-4 gastroparesis diet as tolerated. I have urged her to eat 5-6 small meals a day and to report she was here she has nausea and vomiting, weight loss, or any abdominal pain. Otherwise I will see her back in one month's time for followup. She is to have followup CBC with Dr. Caryl Never in one month also. If she continues to improve we will refer her back to Dr. Ciro Backer for hip replacement surgery.  Problem # 2:  COPD (ICD-496) Assessment: Unchanged  Problem # 3:  ANEMIA (ICD-285.9) Assessment: Improved  Problem # 4:  OSTEOARTHRITIS (ICD-715.90) Assessment: Unchanged per above  Patient Instructions: 1)  Copy sent to : Evelena Peat, MD 2)  Please go to the basement today for your labs.  3)  Please schedule a follow-up appointment in 1 month.  4)  The medication list was reviewed and reconciled.  All changed / newly prescribed medications were explained.  A complete medication list was provided to the patient / caregiver. 5)  Liquids and foods should be eaten in small, frequent meals. Refer to brochure for further instruction.

## 2011-02-06 LAB — COMPREHENSIVE METABOLIC PANEL
ALT: 9 U/L (ref 0–35)
AST: 12 U/L (ref 0–37)
AST: 13 U/L (ref 0–37)
AST: 14 U/L (ref 0–37)
Albumin: 2.3 g/dL — ABNORMAL LOW (ref 3.5–5.2)
Albumin: 2.5 g/dL — ABNORMAL LOW (ref 3.5–5.2)
Alkaline Phosphatase: 32 U/L — ABNORMAL LOW (ref 39–117)
Alkaline Phosphatase: 47 U/L (ref 39–117)
BUN: 6 mg/dL (ref 6–23)
CO2: 26 mEq/L (ref 19–32)
CO2: 28 mEq/L (ref 19–32)
Calcium: 8.1 mg/dL — ABNORMAL LOW (ref 8.4–10.5)
Chloride: 103 mEq/L (ref 96–112)
Creatinine, Ser: 1.04 mg/dL (ref 0.4–1.2)
GFR calc Af Amer: >60 mL/min (ref >60)
GFR calc Af Amer: >60 mL/min (ref >60)
GFR calc non Af Amer: 47 mL/min — ABNORMAL LOW (ref >60)
GFR calc non Af Amer: 52 mL/min — ABNORMAL LOW (ref >60)
GFR calc non Af Amer: >60 mL/min (ref >60)
Glucose, Bld: 125 mg/dL — ABNORMAL HIGH (ref 70–99)
Potassium: 3.4 mEq/L — ABNORMAL LOW (ref 3.5–5.1)
Potassium: 3.5 mEq/L (ref 3.5–5.1)
Sodium: 140 mEq/L (ref 135–145)
Total Bilirubin: 0.2 mg/dL — ABNORMAL LOW (ref 0.3–1.2)
Total Protein: 4.7 g/dL — ABNORMAL LOW (ref 6.0–8.3)
Total Protein: 5.1 g/dL — ABNORMAL LOW (ref 6.0–8.3)

## 2011-02-06 LAB — RENAL FUNCTION PANEL
Albumin: 2.3 g/dL — ABNORMAL LOW (ref 3.5–5.2)
GFR calc non Af Amer: >60 mL/min (ref >60)
Glucose, Bld: 152 mg/dL — ABNORMAL HIGH (ref 70–99)
Phosphorus: 3.1 mg/dL (ref 2.3–4.6)
Potassium: 3.3 mEq/L — ABNORMAL LOW (ref 3.5–5.1)
Sodium: 139 mEq/L (ref 135–145)

## 2011-02-06 LAB — GLUCOSE, CAPILLARY
Glucose-Capillary: 104 mg/dL — ABNORMAL HIGH (ref 70–99)
Glucose-Capillary: 108 mg/dL — ABNORMAL HIGH (ref 70–99)
Glucose-Capillary: 110 mg/dL — ABNORMAL HIGH (ref 70–99)
Glucose-Capillary: 113 mg/dL — ABNORMAL HIGH (ref 70–99)
Glucose-Capillary: 114 mg/dL — ABNORMAL HIGH (ref 70–99)
Glucose-Capillary: 116 mg/dL — ABNORMAL HIGH (ref 70–99)
Glucose-Capillary: 121 mg/dL — ABNORMAL HIGH (ref 70–99)
Glucose-Capillary: 121 mg/dL — ABNORMAL HIGH (ref 70–99)
Glucose-Capillary: 125 mg/dL — ABNORMAL HIGH (ref 70–99)
Glucose-Capillary: 133 mg/dL — ABNORMAL HIGH (ref 70–99)
Glucose-Capillary: 135 mg/dL — ABNORMAL HIGH (ref 70–99)
Glucose-Capillary: 139 mg/dL — ABNORMAL HIGH (ref 70–99)
Glucose-Capillary: 88 mg/dL (ref 70–99)
Glucose-Capillary: 99 mg/dL (ref 70–99)

## 2011-02-06 LAB — BASIC METABOLIC PANEL
BUN: 14 mg/dL (ref 6–23)
BUN: 3 mg/dL — ABNORMAL LOW (ref 6–23)
BUN: 7 mg/dL (ref 6–23)
CO2: 26 mEq/L (ref 19–32)
CO2: 35 mEq/L — ABNORMAL HIGH (ref 19–32)
Calcium: 7.9 mg/dL — ABNORMAL LOW (ref 8.4–10.5)
Calcium: 8 mg/dL — ABNORMAL LOW (ref 8.4–10.5)
Calcium: 8.3 mg/dL — ABNORMAL LOW (ref 8.4–10.5)
Calcium: 8.3 mg/dL — ABNORMAL LOW (ref 8.4–10.5)
Chloride: 90 mEq/L — ABNORMAL LOW (ref 96–112)
Creatinine, Ser: 0.93 mg/dL (ref 0.4–1.2)
Creatinine, Ser: 0.99 mg/dL (ref 0.4–1.2)
Creatinine, Ser: 1.01 mg/dL (ref 0.4–1.2)
Creatinine, Ser: 1.15 mg/dL (ref 0.4–1.2)
GFR calc Af Amer: >60 mL/min (ref >60)
GFR calc non Af Amer: 46 mL/min — ABNORMAL LOW (ref >60)
GFR calc non Af Amer: 55 mL/min — ABNORMAL LOW (ref >60)
GFR calc non Af Amer: 59 mL/min — ABNORMAL LOW (ref >60)
Glucose, Bld: 107 mg/dL — ABNORMAL HIGH (ref 70–99)
Glucose, Bld: 109 mg/dL — ABNORMAL HIGH (ref 70–99)
Glucose, Bld: 123 mg/dL — ABNORMAL HIGH (ref 70–99)
Potassium: 3.2 mEq/L — ABNORMAL LOW (ref 3.5–5.1)
Sodium: 140 mEq/L (ref 135–145)

## 2011-02-06 LAB — DIFFERENTIAL
Basophils Absolute: 0 10*3/uL (ref 0.0–0.1)
Basophils Absolute: 0 10*3/uL (ref 0.0–0.1)
Basophils Relative: 0 % (ref 0–1)
Basophils Relative: 0 % (ref 0–1)
Eosinophils Absolute: 0.2 10*3/uL (ref 0.0–0.7)
Eosinophils Relative: 1 % (ref 0–5)
Eosinophils Relative: 1 % (ref 0–5)
Eosinophils Relative: 3 % (ref 0–5)
Lymphocytes Relative: 15 % (ref 12–46)
Monocytes Absolute: 1 10*3/uL (ref 0.1–1.0)
Monocytes Absolute: 1.3 10*3/uL — ABNORMAL HIGH (ref 0.1–1.0)
Monocytes Relative: 13 % — ABNORMAL HIGH (ref 3–12)
Monocytes Relative: 8 % (ref 3–12)
Neutro Abs: 6 10*3/uL (ref 1.7–7.7)
Neutrophils Relative %: 70 % (ref 43–77)

## 2011-02-06 LAB — CBC
HCT: 23.1 % — ABNORMAL LOW (ref 36.0–46.0)
HCT: 23.6 % — ABNORMAL LOW (ref 36.0–46.0)
HCT: 25.2 % — ABNORMAL LOW (ref 36.0–46.0)
Hemoglobin: 10 g/dL — ABNORMAL LOW (ref 12.0–15.0)
Hemoglobin: 7.3 g/dL — ABNORMAL LOW (ref 12.0–15.0)
Hemoglobin: 8.2 g/dL — ABNORMAL LOW (ref 12.0–15.0)
MCH: 28.6 pg (ref 26.0–34.0)
MCH: 28.7 pg (ref 26.0–34.0)
MCHC: 33.1 g/dL (ref 30.0–36.0)
MCV: 86.8 fL (ref 78.0–100.0)
RBC: 2.87 MIL/uL — ABNORMAL LOW (ref 3.87–5.11)
RDW: 13.4 % (ref 11.5–15.5)
WBC: 12 10*3/uL — ABNORMAL HIGH (ref 4.0–10.5)
WBC: 12 10*3/uL — ABNORMAL HIGH (ref 4.0–10.5)

## 2011-02-06 LAB — URINE CULTURE
Colony Count: NO GROWTH
Culture  Setup Time: 201111091711
Culture: NO GROWTH

## 2011-02-06 LAB — HEPATIC FUNCTION PANEL
ALT: 11 U/L (ref 0–35)
AST: 17 U/L (ref 0–37)
Alkaline Phosphatase: 50 U/L (ref 39–117)
Bilirubin, Direct: <0.1 mg/dL (ref 0.0–0.3)
Total Bilirubin: 0.1 mg/dL — ABNORMAL LOW (ref 0.3–1.2)

## 2011-02-06 LAB — CHOLESTEROL, TOTAL: Cholesterol: 110 mg/dL (ref 0–200)

## 2011-02-06 LAB — PREALBUMIN
Prealbumin: 11.5 mg/dL — ABNORMAL LOW (ref 18.0–45.0)
Prealbumin: 13.7 mg/dL — ABNORMAL LOW (ref 18.0–45.0)

## 2011-02-06 LAB — H. PYLORI ANTIBODY, IGG: H Pylori IgG: 7.5 {ISR} — ABNORMAL HIGH

## 2011-02-06 LAB — TRIGLYCERIDES: Triglycerides: 34 mg/dL (ref <150)

## 2011-02-06 LAB — CEA: CEA: <0.5 ng/mL (ref 0.0–5.0)

## 2011-02-06 LAB — URIC ACID: Uric Acid, Serum: 3.3 mg/dL (ref 2.4–7.0)

## 2011-02-08 ENCOUNTER — Other Ambulatory Visit: Payer: Self-pay | Admitting: Family Medicine

## 2011-02-08 DIAGNOSIS — K219 Gastro-esophageal reflux disease without esophagitis: Secondary | ICD-10-CM

## 2011-02-09 ENCOUNTER — Ambulatory Visit (INDEPENDENT_AMBULATORY_CARE_PROVIDER_SITE_OTHER): Payer: Medicare PPO | Admitting: Family Medicine

## 2011-02-09 ENCOUNTER — Encounter: Payer: Self-pay | Admitting: Family Medicine

## 2011-02-09 VITALS — BP 160/80 | Temp 98.7°F | Ht 59.5 in | Wt 104.0 lb

## 2011-02-09 DIAGNOSIS — M549 Dorsalgia, unspecified: Secondary | ICD-10-CM

## 2011-02-09 DIAGNOSIS — M546 Pain in thoracic spine: Secondary | ICD-10-CM

## 2011-02-09 MED ORDER — CYCLOBENZAPRINE HCL 5 MG PO TABS: 5.0000 mg | ORAL_TABLET | Freq: Three times a day (TID) | ORAL | Status: AC | PRN

## 2011-02-09 NOTE — Progress Notes (Signed)
  Subjective:    Patient ID: [name] Herman, female    DOB: 1935-02-27, 76 y.o.   MRN: [id number]  HPI  patient seen with right-sided neck pain for 5 days duration. Onset occurred after doing a significant amount of housework for several hours. No specific injury. Oxycodone without relief. Pain mostly right side of neck with radiation to trapezius. No right arm symptoms. Patient has tried heat and topical rubs along with massage without relief. No weakness or numbness. Pain is moderate severity. She notes increased muscle tension  Past Medical History  Diagnosis Date  . ANEMIA 09/25/2010  . DEPRESSION 12/02/2009  . ESSENTIAL HYPERTENSION 02/10/2010  . ASTHMA 12/02/2009  . COPD 10/03/2010  . PEPTIC ULCER DISEASE 10/23/2010  . NEPHROLITHIASIS 12/02/2009  . OSTEOARTHRITIS 01/27/2010   Past Surgical History  Procedure Date  . Abdominal hysterectomy 1970  . Back surgery 1984  . Kidney surgery 1970    reports that she has never smoked. She does not have any smokeless tobacco history on file. Her alcohol and drug histories not on file. family history includes Cancer in her father. Allergies  Allergen Reactions  . Meloxicam   . Meperidine Hcl       Review of Systems     Objective:   Physical Exam  patient is alert and in no distress.  Neck exam reveals no adenopathy. Full range of motion with flexion and extension. Tenderness to palpation right trapezius and right paracervical muscles with palpated muscle tension. No spinal tenderness.  Chest clear to auscultation Heart regular rhythm and rate Neuro DT reflexes 2+ throughout upper extremities. Full-strength upper extremities. No  Sensory impairment.       Assessment & Plan:   neck and upper back pain. Suspect muscular origin. Nonfocal neuro exam. Continue heat and muscle massage. Flexeril 5 mg each bedtime and review possible side effects. Consider physical therapy if no improvement by next week

## 2011-02-10 ENCOUNTER — Encounter: Payer: Self-pay | Admitting: Family Medicine

## 2011-02-21 ENCOUNTER — Encounter: Payer: Self-pay | Admitting: Family Medicine

## 2011-02-21 ENCOUNTER — Ambulatory Visit (INDEPENDENT_AMBULATORY_CARE_PROVIDER_SITE_OTHER): Payer: Medicare PPO | Admitting: Family Medicine

## 2011-02-21 VITALS — BP 142/78 | Temp 98.5°F | Wt 104.0 lb

## 2011-02-21 DIAGNOSIS — D649 Anemia, unspecified: Secondary | ICD-10-CM

## 2011-02-21 DIAGNOSIS — M549 Dorsalgia, unspecified: Secondary | ICD-10-CM

## 2011-02-21 DIAGNOSIS — G894 Chronic pain syndrome: Secondary | ICD-10-CM

## 2011-02-21 LAB — CBC WITH DIFFERENTIAL/PLATELET
Basophils Relative: 0.6 % (ref 0.0–3.0)
Eosinophils Absolute: 0.2 10*3/uL (ref 0.0–0.7)
Eosinophils Relative: 1.8 % (ref 0.0–5.0)
HCT: 31.8 % — ABNORMAL LOW (ref 36.0–46.0)
Hemoglobin: 10.6 g/dL — ABNORMAL LOW (ref 12.0–15.0)
Lymphs Abs: 2.8 10*3/uL (ref 0.7–4.0)
MCHC: 33.5 g/dL (ref 30.0–36.0)
MCV: 79.2 fl (ref 78.0–100.0)
Monocytes Absolute: 1 10*3/uL (ref 0.1–1.0)
Neutro Abs: 7.9 10*3/uL — ABNORMAL HIGH (ref 1.4–7.7)
RBC: 4.01 Mil/uL (ref 3.87–5.11)
WBC: 12 10*3/uL — ABNORMAL HIGH (ref 4.5–10.5)

## 2011-02-21 MED ORDER — CYCLOBENZAPRINE HCL 5 MG PO TABS: 5.0000 mg | ORAL_TABLET | Freq: Three times a day (TID) | ORAL | Status: AC | PRN

## 2011-02-21 MED ORDER — OXYCODONE-ACETAMINOPHEN 10-325 MG PO TABS: 1.0000 | ORAL_TABLET | Freq: Four times a day (QID) | ORAL | Status: DC | PRN

## 2011-02-21 NOTE — Progress Notes (Signed)
  Subjective:    Patient ID: [name] Herman, female    DOB: 1935/01/06, 76 y.o.   MRN: [id number]  HPI Patient seen for followup regarding:  Upper back pain. Refer to prior note. Flexeril did help somewhat but her muscles seem to be tightening back up since stopping. She was using this mostly at night. No radiculopathy symptoms. No numbness or weakness. Pain is mostly right trapezius region.  History of peptic ulcer disease and gastritis. Maintained on Nexium. Chronic anemia with recent hemoglobin 9.8. No dizziness. No syncope. Intolerant of iron.  Chronic pain syndrome related to her osteoarthritis. Has been on oxycodone for several years. Generally takes one twice daily. No recent constipation issues.  Pain is generally controlled on one bid.   Review of Systems  Constitutional: Negative for fever, chills, activity change and appetite change.  HENT: Negative for trouble swallowing.   Respiratory: Negative for cough, shortness of breath and wheezing.   Cardiovascular: Negative for chest pain, palpitations and leg swelling.  Gastrointestinal: Negative for nausea, vomiting, abdominal pain, diarrhea, constipation, blood in stool and abdominal distention.  Genitourinary: Negative for dysuria.  Musculoskeletal: Positive for back pain.  Skin: Negative for rash.  Neurological: Negative for dizziness and syncope.       Objective:   Physical Exam  Constitutional: She appears well-developed and well-nourished.  HENT:  Head: Normocephalic and atraumatic.  Neck: Normal range of motion. Neck supple. No thyromegaly present.  Cardiovascular: Normal rate, regular rhythm and normal heart sounds.   Pulmonary/Chest: Effort normal and breath sounds normal. No respiratory distress. She has no wheezes. She has no rales.  Musculoskeletal: She exhibits no edema.       Tender right upper trapezius region. No spinal tenderness  Lymphadenopathy:    She has no cervical adenopathy.  Psychiatric: She has a  normal mood and affect.          Assessment & Plan:  #1 upper back pain.  Suspect musculoskeletal.  Refilled low dose Flexeril.  Consider PT if no better in 2 weeks. #2 Anemia.  Recheck CBC. #3 Osteoarthritis.  Refill Oxycodone.

## 2011-02-22 NOTE — Progress Notes (Signed)
Quick Note:  Pt informed on home VM ______ 

## 2011-02-27 ENCOUNTER — Ambulatory Visit: Payer: Medicare PPO | Admitting: Gastroenterology

## 2011-03-09 ENCOUNTER — Encounter: Payer: Self-pay | Admitting: Family Medicine

## 2011-03-09 ENCOUNTER — Ambulatory Visit (INDEPENDENT_AMBULATORY_CARE_PROVIDER_SITE_OTHER): Payer: Medicare PPO | Admitting: Family Medicine

## 2011-03-09 DIAGNOSIS — M549 Dorsalgia, unspecified: Secondary | ICD-10-CM

## 2011-03-09 DIAGNOSIS — M546 Pain in thoracic spine: Secondary | ICD-10-CM

## 2011-03-09 DIAGNOSIS — J309 Allergic rhinitis, unspecified: Secondary | ICD-10-CM

## 2011-03-09 NOTE — Progress Notes (Signed)
  Subjective:    Patient ID: [name] Herman, female    DOB: 07-27-1935, 76 y.o.   MRN: [id number]  HPI Patient seen for the following  Two-week history of increased cough. Mostly dry. Frequent sneezing and clear nasal mucus. Symptoms worse outdoors. Has not tried any antihistamines. No purulent secretions. No fever or chills. Intermittent sore throat. Patient is nonsmoker.  Recent upper back and neck pain. Woke up one morning with some stiffness. Some relief with muscle relaxers. Symptoms have persisted. No radiculopathy symptoms.   Review of Systems  Constitutional: Negative for fever, chills and appetite change.  HENT: Positive for congestion, rhinorrhea, sneezing and postnasal drip. Negative for sore throat.   Respiratory: Positive for cough. Negative for shortness of breath and wheezing.   Cardiovascular: Negative for chest pain.  Neurological: Negative for weakness and headaches.  Hematological: Negative for adenopathy.       Objective:   Physical Exam  Constitutional: She is oriented to person, place, and time. She appears well-developed and well-nourished.  HENT:  Head: Normocephalic and atraumatic.  Right Ear: External ear normal.  Left Ear: External ear normal.  Mouth/Throat: Oropharynx is clear and moist. No oropharyngeal exudate.  Eyes: Right eye exhibits no discharge.  Neck: No thyromegaly present.  Cardiovascular: Normal rate, regular rhythm and normal heart sounds.   Pulmonary/Chest: No respiratory distress. She has no wheezes. She has no rales.  Musculoskeletal: She exhibits no edema.  Lymphadenopathy:    She has no cervical adenopathy.  Neurological: She is alert and oriented to person, place, and time. No cranial nerve deficit.       No focal weakness upper extremities.          Assessment & Plan:  #1 cough probably related to allergic postnasal drip. Trial  Allegra over-the-counter and samples Nasonex 2 sprays per nostril once daily #2 persistent upper  back and neck pain. Schedule physical therapy

## 2011-03-09 NOTE — Patient Instructions (Signed)
Try over-the-counter Allegra one daily. Nasonex 2 sprays per nostril once daily Avoid mentholated cough drops or any peppermint or spearmint products

## 2011-03-11 ENCOUNTER — Encounter: Payer: Self-pay | Admitting: Family Medicine

## 2011-03-16 ENCOUNTER — Ambulatory Visit (INDEPENDENT_AMBULATORY_CARE_PROVIDER_SITE_OTHER): Payer: Medicare PPO | Admitting: Gastroenterology

## 2011-03-16 ENCOUNTER — Encounter: Payer: Self-pay | Admitting: Gastroenterology

## 2011-03-16 VITALS — BP 164/82 | HR 108 | Ht 59.5 in | Wt 103.0 lb

## 2011-03-16 DIAGNOSIS — K279 Peptic ulcer, site unspecified, unspecified as acute or chronic, without hemorrhage or perforation: Secondary | ICD-10-CM

## 2011-03-16 DIAGNOSIS — K259 Gastric ulcer, unspecified as acute or chronic, without hemorrhage or perforation: Secondary | ICD-10-CM

## 2011-03-16 DIAGNOSIS — T490X5A Adverse effect of local antifungal, anti-infective and anti-inflammatory drugs, initial encounter: Secondary | ICD-10-CM

## 2011-03-16 DIAGNOSIS — K3184 Gastroparesis: Secondary | ICD-10-CM

## 2011-03-16 MED ORDER — ESOMEPRAZOLE MAGNESIUM 40 MG PO CPDR: 40.0000 mg | DELAYED_RELEASE_CAPSULE | Freq: Every day | ORAL | Status: DC

## 2011-03-16 NOTE — Progress Notes (Signed)
This is a 76 year old Caucasian female with chronic peptic ulcer disease, gastric outlet obstruction, and several pyloric channel dilations done endoscopically. Workup for H. pylori was negative, and her ulcer disease was felt secondary to NSAID use. She currently is on Nexium 40 mg a day and is asymptomatic, and wants to liberalize her diet.  Current Medications, Allergies, Past Medical History, Past Surgical History, Family History and Social History were reviewed in [employer] record.  Pertinent Review of Systems Negative   Physical Exam: Awake and alert in no acute distress appears stated age. Her abdominal exam is unremarkable but she does have a succussion splash in the epigastric area. Recent stool exams were guaiac negative.    Assessment and Plan: Chronic pyloric channel  scarring from severe peptic ulcer disease that required prolonged hospitalization. She is now off of NSAIDs and all daily Nexium. I have reviewed a step 3 gastroparesis diet with her and have asked her to be cautious about advancing her diet was not to develop a bezor. We'll see her on a when necessary basis as needed. She may need periodic endoscopic balloon dilations of her pyloric channel.  Please copy her primary care physician, referring physician, and pertinent subspecialists. Encounter Diagnosis  Name Primary?  . Peptic ulcer disease Yes

## 2011-03-16 NOTE — Patient Instructions (Signed)
Follow up as needed

## 2011-03-27 ENCOUNTER — Ambulatory Visit: Payer: Medicare PPO | Admitting: Rehabilitative and Restorative Service Providers"

## 2011-05-04 ENCOUNTER — Other Ambulatory Visit: Payer: Self-pay | Admitting: Family Medicine

## 2011-05-04 DIAGNOSIS — G894 Chronic pain syndrome: Secondary | ICD-10-CM

## 2011-05-04 NOTE — Telephone Encounter (Signed)
Pt req refill of Oxycodone 10-325 mg.

## 2011-05-04 NOTE — Telephone Encounter (Signed)
Last filled 3-28 12, #120

## 2011-05-06 NOTE — Telephone Encounter (Signed)
OK to refill

## 2011-05-07 ENCOUNTER — Other Ambulatory Visit: Payer: Self-pay | Admitting: *Deleted

## 2011-05-07 DIAGNOSIS — G894 Chronic pain syndrome: Secondary | ICD-10-CM

## 2011-05-07 MED ORDER — OXYCODONE-ACETAMINOPHEN 10-325 MG PO TABS: 1.0000 | ORAL_TABLET | Freq: Four times a day (QID) | ORAL | Status: DC | PRN

## 2011-05-07 NOTE — Telephone Encounter (Signed)
Pt informed ready for pick up 

## 2011-06-11 ENCOUNTER — Telehealth: Payer: Self-pay | Admitting: Gastroenterology

## 2011-06-11 NOTE — Telephone Encounter (Signed)
Informed pt she may take the generic for Tylenol which is Acetaminophen; pt stated understanding.

## 2011-06-22 ENCOUNTER — Telehealth: Payer: Self-pay | Admitting: Family Medicine

## 2011-06-22 DIAGNOSIS — G894 Chronic pain syndrome: Secondary | ICD-10-CM

## 2011-06-22 MED ORDER — OXYCODONE-ACETAMINOPHEN 10-325 MG PO TABS: 1.0000 | ORAL_TABLET | Freq: Four times a day (QID) | ORAL | Status: DC | PRN

## 2011-06-22 NOTE — Telephone Encounter (Signed)
OK to refill

## 2011-06-22 NOTE — Telephone Encounter (Signed)
Pt informed ready for pick up 

## 2011-06-22 NOTE — Telephone Encounter (Signed)
Last filled 05/07/11, #120 with 0 refills

## 2011-06-22 NOTE — Telephone Encounter (Signed)
Refill Oxycodone. Thanks. °

## 2011-06-27 ENCOUNTER — Emergency Department (HOSPITAL_COMMUNITY)
Admission: EM | Admit: 2011-06-27 | Discharge: 2011-06-27 | Disposition: A | Discharge status: Home / Self Care | Payer: Medicare HMO | Attending: Emergency Medicine | Admitting: Emergency Medicine

## 2011-06-27 ENCOUNTER — Emergency Department (HOSPITAL_COMMUNITY): Payer: Medicare HMO

## 2011-06-27 DIAGNOSIS — M21939 Unspecified acquired deformity of unspecified forearm: Secondary | ICD-10-CM | POA: Insufficient documentation

## 2011-06-27 DIAGNOSIS — Z79899 Other long term (current) drug therapy: Secondary | ICD-10-CM | POA: Insufficient documentation

## 2011-06-27 DIAGNOSIS — M25439 Effusion, unspecified wrist: Secondary | ICD-10-CM | POA: Insufficient documentation

## 2011-06-27 DIAGNOSIS — S6990XA Unspecified injury of unspecified wrist, hand and finger(s), initial encounter: Secondary | ICD-10-CM | POA: Insufficient documentation

## 2011-06-27 DIAGNOSIS — Y93H2 Activity, gardening and landscaping: Secondary | ICD-10-CM | POA: Insufficient documentation

## 2011-06-27 DIAGNOSIS — Y92009 Unspecified place in unspecified non-institutional (private) residence as the place of occurrence of the external cause: Secondary | ICD-10-CM | POA: Insufficient documentation

## 2011-06-27 DIAGNOSIS — S52599A Other fractures of lower end of unspecified radius, initial encounter for closed fracture: Secondary | ICD-10-CM | POA: Insufficient documentation

## 2011-06-27 DIAGNOSIS — S59909A Unspecified injury of unspecified elbow, initial encounter: Secondary | ICD-10-CM | POA: Insufficient documentation

## 2011-06-27 DIAGNOSIS — W010XXA Fall on same level from slipping, tripping and stumbling without subsequent striking against object, initial encounter: Secondary | ICD-10-CM | POA: Insufficient documentation

## 2011-06-27 DIAGNOSIS — M25539 Pain in unspecified wrist: Secondary | ICD-10-CM | POA: Insufficient documentation

## 2011-06-27 DIAGNOSIS — IMO0002 Reserved for concepts with insufficient information to code with codable children: Secondary | ICD-10-CM | POA: Insufficient documentation

## 2011-06-29 ENCOUNTER — Ambulatory Visit (HOSPITAL_COMMUNITY): Payer: Medicare HMO

## 2011-06-29 ENCOUNTER — Ambulatory Visit (HOSPITAL_COMMUNITY)
Admission: RE | Admit: 2011-06-29 | Discharge: 2011-06-29 | Disposition: A | Discharge status: Home / Self Care | Payer: Medicare HMO | Source: Ambulatory Visit | Attending: Orthopaedic Surgery | Admitting: Orthopaedic Surgery

## 2011-06-29 DIAGNOSIS — K219 Gastro-esophageal reflux disease without esophagitis: Secondary | ICD-10-CM | POA: Insufficient documentation

## 2011-06-29 DIAGNOSIS — S52599A Other fractures of lower end of unspecified radius, initial encounter for closed fracture: Secondary | ICD-10-CM | POA: Insufficient documentation

## 2011-06-29 DIAGNOSIS — W19XXXA Unspecified fall, initial encounter: Secondary | ICD-10-CM | POA: Insufficient documentation

## 2011-06-29 DIAGNOSIS — J45909 Unspecified asthma, uncomplicated: Secondary | ICD-10-CM | POA: Insufficient documentation

## 2011-06-29 DIAGNOSIS — I10 Essential (primary) hypertension: Secondary | ICD-10-CM | POA: Insufficient documentation

## 2011-06-29 DIAGNOSIS — M129 Arthropathy, unspecified: Secondary | ICD-10-CM | POA: Insufficient documentation

## 2011-06-29 LAB — PROTIME-INR: Prothrombin Time: 13.8 seconds (ref 11.6–15.2)

## 2011-06-29 LAB — CBC
MCH: 25.5 pg — ABNORMAL LOW (ref 26.0–34.0)
MCHC: 32.1 g/dL (ref 30.0–36.0)
MCV: 79.3 fL (ref 78.0–100.0)
Platelets: 314 10*3/uL (ref 150–400)
RBC: 4.44 MIL/uL (ref 3.87–5.11)

## 2011-06-29 LAB — BASIC METABOLIC PANEL
BUN: 14 mg/dL (ref 6–23)
Chloride: 101 mEq/L (ref 96–112)
Creatinine, Ser: 0.9 mg/dL (ref 0.50–1.10)
GFR calc Af Amer: >60 mL/min (ref >60)
GFR calc non Af Amer: >60 mL/min (ref >60)
Glucose, Bld: 106 mg/dL — ABNORMAL HIGH (ref 70–99)
Potassium: 3.8 mEq/L (ref 3.5–5.1)

## 2011-07-16 NOTE — Op Note (Signed)
[name] Herman, [name] Herman              ACCOUNT NO.:  [id number]  MEDICAL RECORD NO.:  [id number]  LOCATION:  SDSC                         FACILITY:  [facility]  PHYSICIAN:  Lucero Ide C. Ophelia Charter, M.D.    DATE OF BIRTH:  02/22/1935  DATE OF PROCEDURE:  06/29/2011 DATE OF DISCHARGE:  06/29/2011                              OPERATIVE REPORT   PREOPERATIVE DIAGNOSIS:  Comminuted three-part distal radius fracture with displacement.  POSTOPERATIVE DIAGNOSIS:  Comminuted three-part distal radius fracture with displacement.  PROCEDURE:  Open reduction and internal fixation, volar plate fixation with [employer] plate and interfragmentary screws of 2 separate distal intraarticular fragments.  TOURNIQUET TIME:  47 minutes.  ANESTHESIA:  General plus Marcaine skin local.  A 76 year old female has broken her wrist twice, none in the last 10 years.  She previously had cast fixation.  She did well until she had a recent fall with severe comminuted, angulated, displaced fracture with shortening of 2 cm, oblique with fragments of the volar and dorsal cortex and extension in the distal radial ulnar joint.  After induction of general anesthesia, after informed consent, preoperative antibiotics, standard prepping and draping was performed for the right upper extremity with the proximal arm tourniquet.  Time- out procedure was completed after stockinette, extremity sheets and drapes.  Skin had been marked.  The C-arm was available and images were displayed.  An incision was made over the flexor carpi radialis and incision was made starting at the distal wrist crease and extended proximally.  Subcutaneous veins that were traversing were coagulated. Sheath over the flexor carpi radialis tendon was split and then it was pulled radially to protect the radial artery and the posterior aspect of the sheath was split.  Pronator was peeled off the lateral aspect of the radius exposing the fracture site.   There was a volar cortical fragment which was free enhanced.  Reduction was performed once the volar surface was cleaned of all soft tissue and small Hohmann retractors were placed. A small right volar plate was selected, placed into place, held with K- wire, checked under fluoroscopy, and then the K-wire was removed.  It was adjusted until it was in satisfactory position and a 12-mm cortical screw was placed in the central long slot, checked under fluoroscopy, and plate adjusted until it was in good position.  Distal row was started first, starting from the ulnar aspect with 60-mm pegs placed. One peg was little too close to the radial ulnar joint and this was removed and exchanged for one of the locking angled screws and this was inserted same length and was in good position under fluoroscopy.  All screw holes were filled distally and then a second bicortical purple screw was placed in the plate, checked under fluoroscopy with good bicortical fixation.  There was still some instability of the styloid. A stab incision was made directly over the radial side, blunt spreading to avoid tendons in the first dorsal compartment, and drill guide was placed directly down the cortical surface and this was checked through the incision to make sure did not catch any soft tissue.  Hole was drilled and 3.5 cortical screw was placed after depth gauge measurements  securing this fragment securely and it was checked to make sure that this was proximal to the distal RU joint and would not cause any problems with the RU joint.  This was stable, held the fragment in good position.  The patient was at neutral angulation from the old previous fracture.  She had some loss of volar angulation but would require osteotomy to correct this.  She was at neutral ulnar variance with restoration of the length of the radius correcting the 2-cm shortening and significant angulation.  The wound was copiously irrigated.   Two fine 3-0 Vicryl sutures were placed along the pronator back over the top of the plate, subcutaneous tissues were reapproximated with 3-0 Vicryl, and then subcuticular skin closure was used.  Tincture of Benzoin, Steri- Strips, Marcaine infiltration, and splint was applied.  Final spot x- rays checked showed no screws were penetrating the joint.  There was good restoration of the joint and tourniquet was deflated prior to closure.  Hemostasis was obtained.  Radial artery showed good flow and median nerve exam in the recovery room was normal.     Ceil Roderick C. Ophelia Charter, M.D.     MCY/MEDQ  D:  07/03/2011  T:  07/04/2011  Job:  [id number]  Electronically Signed by Annell Greening M.D. on 07/16/2011 04:47:15 PM

## 2011-08-06 ENCOUNTER — Telehealth: Payer: Self-pay | Admitting: Family Medicine

## 2011-08-06 DIAGNOSIS — G894 Chronic pain syndrome: Secondary | ICD-10-CM

## 2011-08-06 MED ORDER — OXYCODONE-ACETAMINOPHEN 10-325 MG PO TABS: 1.0000 | ORAL_TABLET | Freq: Four times a day (QID) | ORAL | Status: DC | PRN

## 2011-08-06 NOTE — Telephone Encounter (Signed)
Pt informed Rx ready for pick up. 

## 2011-08-06 NOTE — Telephone Encounter (Signed)
OK to refill once.   

## 2011-08-06 NOTE — Telephone Encounter (Signed)
Please advise 

## 2011-08-06 NOTE — Telephone Encounter (Signed)
Refill Oxycodone. Will be out tomorrow. Has an appt with Dr Caryl Never this Friday. Thanks.

## 2011-08-08 ENCOUNTER — Encounter (HOSPITAL_COMMUNITY)
Admission: RE | Admit: 2011-08-08 | Discharge: 2011-08-08 | Disposition: A | Discharge status: Home / Self Care | Payer: Medicare HMO | Source: Ambulatory Visit | Attending: Orthopaedic Surgery | Admitting: Orthopaedic Surgery

## 2011-08-08 LAB — CBC
HCT: 31.2 % — ABNORMAL LOW (ref 36.0–46.0)
MCH: 25.1 pg — ABNORMAL LOW (ref 26.0–34.0)
MCV: 78.2 fL (ref 78.0–100.0)
Platelets: 413 10*3/uL — ABNORMAL HIGH (ref 150–400)
RDW: 14.8 % (ref 11.5–15.5)

## 2011-08-08 LAB — URINALYSIS, ROUTINE W REFLEX MICROSCOPIC
Bilirubin Urine: NEGATIVE
Ketones, ur: NEGATIVE mg/dL
Nitrite: NEGATIVE
Urobilinogen, UA: 0.2 mg/dL (ref 0.0–1.0)
pH: 6 (ref 5.0–8.0)

## 2011-08-08 LAB — DIFFERENTIAL
Lymphs Abs: 2.7 10*3/uL (ref 0.7–4.0)
Monocytes Absolute: 1.3 10*3/uL — ABNORMAL HIGH (ref 0.1–1.0)
Monocytes Relative: 9 % (ref 3–12)
Neutro Abs: 10.1 10*3/uL — ABNORMAL HIGH (ref 1.7–7.7)
Neutrophils Relative %: 71 % (ref 43–77)

## 2011-08-08 LAB — COMPREHENSIVE METABOLIC PANEL
ALT: 7 U/L (ref 0–35)
AST: 11 U/L (ref 0–37)
Albumin: 3.3 g/dL — ABNORMAL LOW (ref 3.5–5.2)
Alkaline Phosphatase: 110 U/L (ref 39–117)
CO2: 30 mEq/L (ref 19–32)
Chloride: 101 mEq/L (ref 96–112)
Creatinine, Ser: 0.81 mg/dL (ref 0.50–1.10)
GFR calc non Af Amer: >60 mL/min (ref >60)
Potassium: 3.6 mEq/L (ref 3.5–5.1)
Total Bilirubin: 0.2 mg/dL — ABNORMAL LOW (ref 0.3–1.2)

## 2011-08-08 LAB — URINE MICROSCOPIC-ADD ON

## 2011-08-08 LAB — PROTIME-INR: INR: 1.01 (ref 0.00–1.49)

## 2011-08-10 ENCOUNTER — Ambulatory Visit (INDEPENDENT_AMBULATORY_CARE_PROVIDER_SITE_OTHER): Payer: Medicare HMO | Admitting: Family Medicine

## 2011-08-10 ENCOUNTER — Encounter: Payer: Self-pay | Admitting: Family Medicine

## 2011-08-10 VITALS — BP 136/80 | HR 76 | Temp 98.2°F | Resp 16 | Ht 59.0 in | Wt 101.0 lb

## 2011-08-10 DIAGNOSIS — K259 Gastric ulcer, unspecified as acute or chronic, without hemorrhage or perforation: Secondary | ICD-10-CM

## 2011-08-10 DIAGNOSIS — Z23 Encounter for immunization: Secondary | ICD-10-CM

## 2011-08-10 NOTE — Progress Notes (Signed)
  Subjective:    Patient ID: [name] Herman, female    DOB: 12/10/1934, 76 y.o.   MRN: [id number]  HPI Medical followup. Patient has scheduled surgery next week for hip replacement. She has questions regarding Nexium. Previous history of nonsteroidal-induced gastric ulcer with Celebrex. This was almost one year ago. Has been on Nexium since then. She does have occasional GERD symptoms. Denies any recent abdominal pain.  Immunizations reviewed. Last tetanus unknown. No flu vaccine yet this year. She does think she has had previous Pneumovax.   Review of Systems  Constitutional: Negative for fever, chills and appetite change.  Respiratory: Negative for cough and shortness of breath.   Cardiovascular: Negative for chest pain, palpitations and leg swelling.  Gastrointestinal: Negative for abdominal pain.  Musculoskeletal: Positive for arthralgias. Negative for gait problem.       Objective:   Physical Exam  Constitutional: She appears well-developed and well-nourished. No distress.  HENT:  Mouth/Throat: Oropharynx is clear and moist.  Cardiovascular: Normal rate and regular rhythm.   Pulmonary/Chest: Effort normal and breath sounds normal. No respiratory distress. She has no wheezes. She has no rales.  Musculoskeletal: She exhibits no edema.          Assessment & Plan:  #1 history of gastric ulcer which was nonsteroidal induced. We recommend that she maintain Nexium through her surgery and if doing well several weeks following upcoming surgery consider trial off. Samples provided. #2 health maintenance. Recommend flu vaccine. Also patient needs tetanus booster.

## 2011-08-10 NOTE — Patient Instructions (Signed)
Consider trial off Nexium but would wait until least 2-3 weeks following upcoming surgery

## 2011-08-17 ENCOUNTER — Inpatient Hospital Stay (HOSPITAL_COMMUNITY)
Admission: RE | Admit: 2011-08-17 | Discharge: 2011-08-23 | DRG: 470 | Disposition: A | Discharge status: Home Health | Payer: Medicare HMO | Source: Ambulatory Visit | Attending: Orthopaedic Surgery | Admitting: Orthopaedic Surgery

## 2011-08-17 ENCOUNTER — Inpatient Hospital Stay (HOSPITAL_COMMUNITY): Payer: Medicare HMO

## 2011-08-17 DIAGNOSIS — F329 Major depressive disorder, single episode, unspecified: Secondary | ICD-10-CM | POA: Diagnosis present

## 2011-08-17 DIAGNOSIS — H269 Unspecified cataract: Secondary | ICD-10-CM | POA: Diagnosis present

## 2011-08-17 DIAGNOSIS — F411 Generalized anxiety disorder: Secondary | ICD-10-CM | POA: Diagnosis present

## 2011-08-17 DIAGNOSIS — M129 Arthropathy, unspecified: Secondary | ICD-10-CM | POA: Diagnosis present

## 2011-08-17 DIAGNOSIS — G589 Mononeuropathy, unspecified: Secondary | ICD-10-CM | POA: Diagnosis present

## 2011-08-17 DIAGNOSIS — M1610 Unilateral primary osteoarthritis, unspecified hip: Principal | ICD-10-CM | POA: Diagnosis present

## 2011-08-17 DIAGNOSIS — Z7901 Long term (current) use of anticoagulants: Secondary | ICD-10-CM

## 2011-08-17 DIAGNOSIS — M87059 Idiopathic aseptic necrosis of unspecified femur: Secondary | ICD-10-CM | POA: Diagnosis present

## 2011-08-17 DIAGNOSIS — Z01812 Encounter for preprocedural laboratory examination: Secondary | ICD-10-CM

## 2011-08-17 DIAGNOSIS — K219 Gastro-esophageal reflux disease without esophagitis: Secondary | ICD-10-CM | POA: Diagnosis present

## 2011-08-17 DIAGNOSIS — M169 Osteoarthritis of hip, unspecified: Principal | ICD-10-CM | POA: Diagnosis present

## 2011-08-17 DIAGNOSIS — D62 Acute posthemorrhagic anemia: Secondary | ICD-10-CM | POA: Diagnosis not present

## 2011-08-17 DIAGNOSIS — F3289 Other specified depressive episodes: Secondary | ICD-10-CM | POA: Diagnosis present

## 2011-08-17 DIAGNOSIS — J45909 Unspecified asthma, uncomplicated: Secondary | ICD-10-CM | POA: Diagnosis present

## 2011-08-17 DIAGNOSIS — I10 Essential (primary) hypertension: Secondary | ICD-10-CM | POA: Diagnosis present

## 2011-08-17 LAB — CBC
HCT: 32 % — ABNORMAL LOW (ref 36.0–46.0)
Hemoglobin: 10.2 g/dL — ABNORMAL LOW (ref 12.0–15.0)
MCH: 24.9 pg — ABNORMAL LOW (ref 26.0–34.0)
MCHC: 31.9 g/dL (ref 30.0–36.0)
MCV: 78.2 fL (ref 78.0–100.0)
RBC: 4.09 MIL/uL (ref 3.87–5.11)

## 2011-08-17 LAB — DIFFERENTIAL
Basophils Relative: 0 % (ref 0–1)
Lymphocytes Relative: 22 % (ref 12–46)
Lymphs Abs: 2.4 10*3/uL (ref 0.7–4.0)
Monocytes Absolute: 1 10*3/uL (ref 0.1–1.0)
Monocytes Relative: 9 % (ref 3–12)
Neutro Abs: 7.7 10*3/uL (ref 1.7–7.7)
Neutrophils Relative %: 68 % (ref 43–77)

## 2011-08-17 LAB — HEMOGLOBIN AND HEMATOCRIT, BLOOD
HCT: 29.8 % — ABNORMAL LOW (ref 36.0–46.0)
Hemoglobin: 9.7 g/dL — ABNORMAL LOW (ref 12.0–15.0)

## 2011-08-18 LAB — CBC
HCT: 23.3 % — ABNORMAL LOW (ref 36.0–46.0)
Hemoglobin: 7.6 g/dL — ABNORMAL LOW (ref 12.0–15.0)
RBC: 2.94 MIL/uL — ABNORMAL LOW (ref 3.87–5.11)
WBC: 10.9 10*3/uL — ABNORMAL HIGH (ref 4.0–10.5)

## 2011-08-18 LAB — PROTIME-INR: INR: 1.2 (ref 0.00–1.49)

## 2011-08-18 LAB — BASIC METABOLIC PANEL
CO2: 31 mEq/L (ref 19–32)
Calcium: 7.5 mg/dL — ABNORMAL LOW (ref 8.4–10.5)
GFR calc non Af Amer: >60 mL/min (ref >60)
Potassium: 3.2 mEq/L — ABNORMAL LOW (ref 3.5–5.1)
Sodium: 140 mEq/L (ref 135–145)

## 2011-08-18 LAB — DIFFERENTIAL
Basophils Absolute: 0 10*3/uL (ref 0.0–0.1)
Basophils Relative: 0 % (ref 0–1)
Lymphocytes Relative: 17 % (ref 12–46)
Neutro Abs: 7.5 10*3/uL (ref 1.7–7.7)
Neutrophils Relative %: 69 % (ref 43–77)

## 2011-08-19 LAB — CBC
HCT: 20.3 % — ABNORMAL LOW (ref 36.0–46.0)
MCHC: 33 g/dL (ref 30.0–36.0)
Platelets: 244 10*3/uL (ref 150–400)
RDW: 15.7 % — ABNORMAL HIGH (ref 11.5–15.5)

## 2011-08-19 LAB — PROTIME-INR: INR: 2.19 — ABNORMAL HIGH (ref 0.00–1.49)

## 2011-08-19 LAB — POCT I-STAT 4, (NA,K, GLUC, HGB,HCT)
Glucose, Bld: 140 mg/dL — ABNORMAL HIGH (ref 70–99)
HCT: 20 % — ABNORMAL LOW (ref 36.0–46.0)
Hemoglobin: 6.8 g/dL — CL (ref 12.0–15.0)
Potassium: 3.5 mEq/L (ref 3.5–5.1)
Sodium: 138 mEq/L (ref 135–145)

## 2011-08-19 LAB — DIFFERENTIAL
Basophils Absolute: 0 10*3/uL (ref 0.0–0.1)
Basophils Relative: 0 % (ref 0–1)
Eosinophils Absolute: 0 10*3/uL (ref 0.0–0.7)
Eosinophils Relative: 0 % (ref 0–5)
Lymphocytes Relative: 11 % — ABNORMAL LOW (ref 12–46)
Monocytes Absolute: 1.4 10*3/uL — ABNORMAL HIGH (ref 0.1–1.0)

## 2011-08-20 LAB — DIFFERENTIAL
Lymphs Abs: 2.3 10*3/uL (ref 0.7–4.0)
Monocytes Relative: 9 % (ref 3–12)
Neutro Abs: 10.9 10*3/uL — ABNORMAL HIGH (ref 1.7–7.7)
Neutrophils Relative %: 74 % (ref 43–77)

## 2011-08-20 LAB — TYPE AND SCREEN
ABO/RH(D): B POS
Unit division: 0
Unit division: 0

## 2011-08-20 LAB — CBC
HCT: 29.9 % — ABNORMAL LOW (ref 36.0–46.0)
Hemoglobin: 10.1 g/dL — ABNORMAL LOW (ref 12.0–15.0)
MCH: 27.2 pg (ref 26.0–34.0)
MCV: 80.4 fL (ref 78.0–100.0)
RBC: 3.72 MIL/uL — ABNORMAL LOW (ref 3.87–5.11)

## 2011-08-22 LAB — PROTIME-INR: INR: 3.33 — ABNORMAL HIGH (ref 0.00–1.49)

## 2011-08-23 LAB — PROTIME-INR
INR: 4.16 — ABNORMAL HIGH (ref 0.00–1.49)
Prothrombin Time: 40.8 seconds — ABNORMAL HIGH (ref 11.6–15.2)

## 2011-08-28 NOTE — Op Note (Signed)
[name] Herman              ACCOUNT NO.:  [id number]  MEDICAL RECORD NO.:  [id number]  LOCATION:  2899                         FACILITY:  [facility]  PHYSICIAN:  Jeremyah Jelley C. Ophelia Charter, M.D.    DATE OF BIRTH:  04-18-1935  DATE OF PROCEDURE:  08/17/2011 DATE OF DISCHARGE:                              OPERATIVE REPORT   PREOPERATIVE DIAGNOSIS:  Old femoral neck fracture with secondary hip osteoarthritis.  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  PROCEDURE:  Conversion of hip fusion to total hip arthroplasty.  DePuy Summit Basic cement, #5 femoral stem, #4 cement plug, +1.5 neck, 36-mm ball, 52-mm Press-Fit acetabular shell with dome screws x2.  10-degree wall poly.  SURGEON:  Zenovia Justman C. Ophelia Charter, MD  ASSISTANT:  Laural Benes. Su Hilt, PA  ESTIMATED BLOOD LOSS:  200 mL.  DRAINS:  None.  PROCEDURE IN DETAIL:  After induction of general anesthesia and orotracheal intubation, Foley catheter was placed.  Of note, there was a minimal adduction.  She had maybe 5 degrees of internal and external rotation and had been using 1.5-2 inches lift under shoe for several years.  Preoperative x-ray showed evidence of old femoral neck fracture or possibly avascular necrosis with collapse with some protrusio high riding socket.  There appeared to be some joint space in some areas left and there was evidence of impaction of the old fracture with shortening of 25 mm.  The patient was placed in lateral position.  Preoperative antibiotics were given, standard prepping and draping was performed using split sheets, drapes, axillary roll was used, and DuraPrep was used.  The patient was then after impervious stockinette, Coban, a single Betadine, Steri drape covered the entire field including the groin.  Time-out procedure was completed.  The hip would only flex about 20-30 degrees and could not be flexed up to 90.  Incision was made for posterior approach.  Gluteus maximus split  in line with the fibers. Posterior capsule was divided and at this point motion was attempted to save the socket and it could be identified.  There was no motion between the impacted neck into the pelvis.  A large Steinmann pin was placed in the pelvis but the bone was soft and it did not hold.  It was repositioned but would not hold in the inner table.  Initially, measurement of 62 mm was made between the Steinmann pin and a drill bit placed in the greater trochanter.  The edge of the acetabulum was identified and edge of the labrum was identified but with rotation and flexion there was not any motion of the hip socket.  Saw was used to cut across the neck trying to estimate and get near to the appropriate angle and this finally freed the hip up for internal rotation and could be flexed.  Neck was recut 1 fingerbreadth above the lesser trochanter. There was large amount of bone anteriorly.  I used pieces of fragment from the old head that were present, stuck down the soft tissue, attached the anterior capsule.  Femur was prepared with canal starter, hand T handle, sequential reaming, lateralizer, cookie-cutter all attempts to lateralize it.  The lesser trochanter was identified  but there were large pieces of heterotopic bone attached which made the AP diameter of the neck twice what was normal.  Trimming with osteotome was performed down to the old cortex and it begin to appear to have some semblance of the normal calcar and anterior and posterior cortical surface.  2-3 cm pieces of bone removed anteriorly.  These were attached on the capsule.  Care was taken to avoid damage to the rectus.  The abductor was slightly tight but did not appear the need of releasing at the end of the case.  This was checked preoperatively in the supine position when the Foley was inserted as well as during the case with palpation through the Steri drape.  Continue preparation of the femur was performed and  #4 stem was placed.  The bone was soft.  No crack appeared in the calcar but with the bone being soft, it was felt the cemented stem would be preferable.  Trial was removed and then attention was turned to the acetabular side.  Sharp narrow Rogelia Rohrer was placed anteriorly and further bone trimming with an osteotome.  The head was fused in the acetabulum and could not be removed, so reamers were started just from reaming through the head progressing.  Anterior and posterior walls were somewhat soft and once reaming progressed up to 51, there was 1 mm area of protrusio medially where the bone was paper thin. Anterior and posterior walls again were somewhat soft and it was elected to put in Press-Fit cup with dome screws.  Trial 52 showed adequate fit but again it was noted that all bone was soft.  Permanent 52 DePuy socket was inserted with 3 dome screws.  Centralizer was used to plug the insertion hole.  Cup was down and minimal drilling was used and 2 dome screws were placed.  As soon as the drill started in, I was stopped, so there would be a better bite and screws were just hand inserted, tightened down, and held securely.  A trial liner was inserted, +4 trial stem, and -2 neck length and x-ray was taken.  This showed that the stem was in varus socket looked acceptable.  There was some difficulty getting the hip reduced as expected with 25 mm of preoperative shortening.  The #4 was removed.  It appeared the femoral sizing could go up and more lateralization, more cookie-cutter, more lateralizing, reaming and then a #5 stem was inserted and second x-ray was taken.  This appeared better sizing.  The #5 Basic Summit stem was selected, a 13-mm centralizing tip was applied, #4 cement plug was inserted to appropriate length and cement was vacuum mixed, preparation of the canal with bottle brush, pulsatile lavage, multiple drying sponges, insertion of the cement, pressurization then insertion of  the stem with the permanent poly liner already inserted with the 10-degree lip posterolateral.  There had been good stability with the trial and quad was slightly tight but knee would flex past 90.  There was good stability in extension, external rotation, and hip could be flexed to 90, internally rotated to 80 and there was no remaining bone that would cause it to leave her out, so the hip was stable.  Sciatic nerve was palpated multiple times throughout the case to make sure it was free and no areas of scar tissue bounded down with its course followed from the sciatic notch down the distal aspect of the skin incision.  The #5 stem was held making sure that it was  pushed as far lateral as possible to prevent the stem from shifting into varus.  Once the cement was hardened for 15 minutes, a +1.5 ball was selected, impacted, checked and then reduced with some difficulty.  Again, findings of identical stability. Some remaining bone chips and remaining pieces of cement were removed. Pulsatile lavage followed by standard layered closure with repair of the piriformis to the gluteus medius, there was still about 1 cm gap due to the lengthening.  Tensor fascia was reapproximated with 0-Ticron, #1 Vicryl in gluteus maximus, 2-0 Vicryl in the subcutaneous tissue, 4-0 Vicryl subcuticular skin closure.  Tincture of benzoin, Steri-Strips, Marcaine infiltration in the skin.  Transfer to spine position and placement of a knee immobilizer.  Instrument count and needle count was correct.  The patient tolerated the procedure well.  Sciatic exam in the recovery room is not available yet.     Macai Sisneros C. Ophelia Charter, M.D.     MCY/MEDQ  D:  08/17/2011  T:  08/17/2011  Job:  [id number]  Electronically Signed by [name] Herman M.D. on 08/28/2011 05:24:19 PM

## 2011-09-07 ENCOUNTER — Other Ambulatory Visit (HOSPITAL_COMMUNITY): Payer: Medicare HMO

## 2011-09-14 NOTE — Discharge Summary (Signed)
[name] Herman, [name] Herman              ACCOUNT NO.:  [id number]  MEDICAL RECORD NO.:  [id number]  LOCATION:  5024                         FACILITY:  [facility]  PHYSICIAN:  Mark C. Ophelia Charter, M.D.    DATE OF BIRTH:  06-05-1935  DATE OF ADMISSION:  08/17/2011 DATE OF DISCHARGE:  08/23/2011                              DISCHARGE SUMMARY   ADMISSION DIAGNOSES: 1. Old femoral neck fracture with secondary hip osteoarthritis. 2. History kidney stones. 3. Hypertension. 4. Gastroesophageal reflux disease. 5. Asthma. 6. Anxiety and depression. 7. Glaucoma. 8. Bilateral cataract. 9. Arthritis.  DISCHARGE DIAGNOSES: 1. Old femoral neck fracture with secondary hip osteoarthritis. 2. History kidney stones. 3. Hypertension. 4. Gastroesophageal reflux disease. 5. Asthma. 6. Anxiety and depression. 7. Glaucoma. 8. Bilateral cataract. 9. Arthritis. 10.Posthemorrhagic anemia requiring blood transfusion.  PROCEDURE:  Conversion of old hip fracture with arthrofibrosis to total hip arthroplasty, press- fit acetabular shell, and cemented femoral stem. This was performed by Dr. Ophelia Charter assisted by Skip Mayer [facility] under general anesthesia.  CONSULTATIONS:  None.  BRIEF HISTORY:  The patient is a 76 year old female with radiographic evidence of a subacute femoral neck fracture that is healed in varus deformity.  She now has osteoarthritic changes on x-ray with bone on bone deformity.  Clinically, she has some pain with weightbearing.  She is unable to do her usual activities secondary to the degree of pain she has from her hip.  She is having difficulty continuing activities of daily living on an independent level.  It was felt she would benefit from conversion of the arthrofibrosis of hip to a total hip arthroplasty.  Anticipated outcome of the procedure would allow the patient to have increased function and activities of daily living as well as quality of life.  BRIEF HOSPITAL COURSE:  The patient  was admitted and underwent conversion of hip fusion to total hip arthroplasty by Dr. Ophelia Charter on August 17, 2011, assisted by Skip Mayer [facility] under general anesthesia.  The patient tolerated the procedure without complications. Postoperatively, she was started on Coumadin for DVT prophylaxis. Adjustments in Coumadin dose made according to daily pro times by the pharmacist at [facility].  She was started on physical therapy for ambulation and gait training.  She was allowed weightbearing as tolerated on the operative extremity.  The patient was initially slow to progress with physical therapy.  It was felt that she may benefit from short-term nursing home placement; however, she did have significant assistance at home and eventually was felt to be safe for discharge to her home with home health physical therapy.  The patient had quite a bit of pain postoperatively due to the shortening of the hip due to the previous fracture and shortening of the surrounding tissues.  To bring the hip about the length, there was quite a bit of stretch of the surrounding tissues which caused her considerable pain.  This was explained to the patient at length and adequate pain medication was utilized.  However, the first few days, she began getting better, pain control then was weaned to p.o. analgesics.  Neurovascular and motor function of the lower extremities remained intact, and she had no sciatic symptoms postoperatively.  Dressing  changes were done daily, and her wound was healing well without signs of infection.  The patient's hemoglobin dropped to 6.7 with hematocrit 20.3 postoperatively.  She did receive a blood transfusion of 2 units packed red blood cells with hemoglobin and hematocrit returning at 10.1 and 29.9.  Chemistry studies with values within normal limits with mildly decreased potassium at 3.2. INR at discharge 4.16.  The patient eventually was stable for discharge to her  home.  DISCHARGE INSTRUCTIONS:  She was given instructions for continued physical therapy by the home health agency for range of motion, stretching, strengthening exercise as well as ambulation and gait training.  She will continue to be weightbearing as tolerated on the operative extremity.  The patient will change her dressing as needed and/or daily and use ice packs to the hip as needed.  She will follow up with doctor in one week from her discharge and will call to arrange the appointment.  She will use a stool softener daily.  MEDICATIONS AT DISCHARGE: 1. The patient was given prescriptions for Coumadin with no Coumadin     dose on September 27 and September 28 and then after that     arrangements will be made by the pharmacist for continuation of her     Coumadin. 2. She was also given diazepam 5 mg one half to one tab every 8 hours     as needed for spasm. 3. Percocet 5/325 one to two every 4-6 hours as needed for pain. 4. She will continue on her Nexium 40 mg daily. 5. Sertraline 100 mg one half tablet daily. 6. Xalatan ophthalmic drops in the right eye at bedtime.  The patient is instructed to call the office should she have questions or concerns prior to her return office visit.  CONDITION ON DISCHARGE:  Stable.     Wende Neighbors, P.A.   ______________________________ Veverly Fells Ophelia Charter, M.D.    SMV/MEDQ  D:  09/06/2011  T:  09/06/2011  Job:  [id number]  Electronically Signed by Maud Deed P.A. on 09/12/2011 09:43:15 AM Electronically Signed by Annell Greening M.D. on 09/14/2011 09:59:08 AM

## 2011-09-19 ENCOUNTER — Encounter: Payer: Self-pay | Admitting: Family Medicine

## 2011-09-19 ENCOUNTER — Ambulatory Visit (INDEPENDENT_AMBULATORY_CARE_PROVIDER_SITE_OTHER): Payer: Medicare HMO | Admitting: Family Medicine

## 2011-09-19 VITALS — BP 140/78 | Temp 98.2°F | Wt 101.0 lb

## 2011-09-19 DIAGNOSIS — D649 Anemia, unspecified: Secondary | ICD-10-CM

## 2011-09-19 DIAGNOSIS — R609 Edema, unspecified: Secondary | ICD-10-CM

## 2011-09-19 LAB — BASIC METABOLIC PANEL
BUN: 13 mg/dL (ref 6–23)
GFR: 68.44 mL/min (ref >60.00)
Potassium: 4.3 mEq/L (ref 3.5–5.1)
Sodium: 140 mEq/L (ref 135–145)

## 2011-09-19 LAB — CBC WITH DIFFERENTIAL/PLATELET
Eosinophils Relative: 1 % (ref 0.0–5.0)
HCT: 33.6 % — ABNORMAL LOW (ref 36.0–46.0)
Hemoglobin: 11.1 g/dL — ABNORMAL LOW (ref 12.0–15.0)
Lymphs Abs: 2.8 10*3/uL (ref 0.7–4.0)
Monocytes Relative: 8.5 % (ref 3.0–12.0)
Platelets: 350 10*3/uL (ref 150.0–400.0)
WBC: 10.5 10*3/uL (ref 4.5–10.5)

## 2011-09-19 LAB — HEPATIC FUNCTION PANEL
AST: 17 U/L (ref 0–37)
Albumin: 3.3 g/dL — ABNORMAL LOW (ref 3.5–5.2)
Alkaline Phosphatase: 121 U/L — ABNORMAL HIGH (ref 39–117)

## 2011-09-19 LAB — TSH: TSH: 0.67 u[IU]/mL (ref 0.35–5.50)

## 2011-09-19 MED ORDER — TRIAMTERENE-HCTZ 37.5-25 MG PO CAPS: 1.0000 | ORAL_CAPSULE | ORAL | Status: DC

## 2011-09-19 NOTE — Patient Instructions (Signed)
Elevate legs frequently.

## 2011-09-19 NOTE — Progress Notes (Signed)
  Subjective:    Patient ID: [name] Herman, female    DOB: May 19, 1935, 76 y.o.   MRN: [id number]  HPI  Bilateral foot and lower leg edema. Noted over the past couple of weeks. Had recent left total hip placement. Placed on Coumadin until yesterday. Edema is bilateral. Denies any leg or calf pain. Swelling worse late in the day. Some mild edema early morning. No history of heart failure. Does not take any medications that would likely be exacerbating. No alleviating factors. No excessive sodium intake. Denies any dyspnea or orthopnea.  Past Medical History  Diagnosis Date  . ANEMIA 09/25/2010  . DEPRESSION 12/02/2009  . ESSENTIAL HYPERTENSION 02/10/2010  . ASTHMA 12/02/2009  . COPD 10/03/2010  . PEPTIC ULCER DISEASE 10/23/2010  . NEPHROLITHIASIS 12/02/2009  . OSTEOARTHRITIS 01/27/2010  . Pyloric stenosis   . Peptic ulcer, unspecified site, unspecified as acute or chronic, without mention of hemorrhage, perforation, or obstruction   . Gastritis    Past Surgical History  Procedure Date  . Abdominal hysterectomy 1970  . Back surgery 1984  . Kidney surgery 1970    reports that she has never smoked. She has never used smokeless tobacco. She reports that she does not drink alcohol or use illicit drugs. family history includes Prostate cancer in her father.  There is no history of Colon cancer. Allergies  Allergen Reactions  . Meloxicam   . Meperidine Hcl       Review of Systems  Constitutional: Negative for appetite change, fatigue and unexpected weight change.  Respiratory: Negative for cough and shortness of breath.   Cardiovascular: Positive for leg swelling. Negative for chest pain and palpitations.  Gastrointestinal: Negative for abdominal pain.  Musculoskeletal: Negative for joint swelling.  Neurological: Negative for dizziness.       Objective:   Physical Exam  Constitutional: She is oriented to person, place, and time. She appears well-developed and well-nourished.  Neck:  Neck supple.  Cardiovascular: Normal rate, regular rhythm and normal heart sounds.   Pulmonary/Chest: Effort normal and breath sounds normal. No respiratory distress. She has no wheezes. She has no rales.  Musculoskeletal:       Patient has 1+ pitting edema of feet and ankles bilaterally. Minimal edema legs  Lymphadenopathy:    She has no cervical adenopathy.  Neurological: She is alert and oriented to person, place, and time.  Skin: No rash noted.          Assessment & Plan:  Bilateral edema mostly involving feet and ankles. Suspect venous stasis. Check labs with TSH, basic metabolic panel, and hepatic panel to assess albumin. Frequent elevation.  If labs all normal consider Dyazide 1 daily. Reassess one month  Anemia following surgery with hemoglobin 7.6 post-op. Recheck CBC today

## 2011-09-19 NOTE — Progress Notes (Deleted)
  Subjective:    Patient ID: [name] Herman, female    DOB: [birth year], 76 y.o.   MRN: [id number]  HPI  Multiple seborrheic keratoses including the left face in mid back region. These are irritating because of location. Frequent pruritus. Several keratoses mid back irritating because of rubbing against her bra strap. Treated previously with liquid nitrogen and has tolerated well. Needs flu vaccine.   Review of Systems     Objective:   Physical Exam  Constitutional: She appears well-developed and well-nourished.  Cardiovascular: Normal rate and regular rhythm.   Pulmonary/Chest: Effort normal and breath sounds normal. No respiratory distress. She has no wheezes. She has no rales.  Skin:       Multiple seborrheic keratoses scattered over the midthoracic region. She has one behind the left ear and two left forehead          Assessment & Plan:  Multiple irritated seborrheic keratoses. Discussed risks and benefits of treatment with liquid nitrogen patient consented. 15 seborrheic keratoses treated back and patient tolerated well. Follow up promptly if signs of secondary infection. Vaccine given.

## 2011-09-20 NOTE — Progress Notes (Signed)
Quick Note:  Pt informed on home VM ______ 

## 2011-09-28 ENCOUNTER — Other Ambulatory Visit: Payer: Self-pay | Admitting: Family Medicine

## 2011-09-28 DIAGNOSIS — G894 Chronic pain syndrome: Secondary | ICD-10-CM

## 2011-09-28 NOTE — Telephone Encounter (Signed)
Pt need new rx oxycodone 5-325mg 

## 2011-09-28 NOTE — Telephone Encounter (Signed)
Ok to refill 

## 2011-09-28 NOTE — Telephone Encounter (Signed)
Will leave for Monday - office closed and pt will have to come by and pick up rx

## 2011-10-01 MED ORDER — OXYCODONE-ACETAMINOPHEN 10-325 MG PO TABS: 1.0000 | ORAL_TABLET | Freq: Four times a day (QID) | ORAL | Status: DC | PRN

## 2011-10-01 NOTE — Telephone Encounter (Signed)
I spoke with pt husband, we have 10-325 and 5-325 (Per Dr Ophelia Charter).  Husband doesn't think she ever had the 5-325 filled, Dr Ophelia Charter had prescribed #30 pills with 0 refills.  We will fill the 10-325 as we have in the past and remove the 5-325 dose.

## 2011-10-16 ENCOUNTER — Ambulatory Visit: Payer: Medicare HMO | Admitting: Family Medicine

## 2011-10-24 ENCOUNTER — Ambulatory Visit (INDEPENDENT_AMBULATORY_CARE_PROVIDER_SITE_OTHER): Payer: Medicare HMO | Admitting: Family Medicine

## 2011-10-24 ENCOUNTER — Encounter: Payer: Self-pay | Admitting: Family Medicine

## 2011-10-24 VITALS — BP 122/68 | Temp 98.0°F | Wt 101.0 lb

## 2011-10-24 DIAGNOSIS — R609 Edema, unspecified: Secondary | ICD-10-CM

## 2011-10-24 NOTE — Patient Instructions (Signed)
Monitor blood pressure and start back dyazide if consistently over 140/90 May take dyazide as needed for leg edema.

## 2011-10-24 NOTE — Progress Notes (Signed)
  Subjective:    Patient ID: [name] Herman, female    DOB: 10/28/1935, 76 y.o.   MRN: [id number]  HPI  Followup edema lower extremities. Refer to prior note. Labs basically unremarkable. Added Dyazide 1 daily and edema has improved. Her weight is unchanged. Denies any dyspnea. She ran out of medication a couple days ago and has not noted any recurrent edema since then. Blood pressures been stable. Her anemia had improved following surgery was still slightly low 11.1.   Review of Systems  Constitutional: Negative for fever, appetite change and unexpected weight change.  Respiratory: Negative for cough and shortness of breath.   Cardiovascular: Negative for chest pain, palpitations and leg swelling.  Musculoskeletal: Negative for arthralgias.  Neurological: Negative for dizziness.       Objective:   Physical Exam  Constitutional: She appears well-developed and well-nourished. No distress.  Cardiovascular: Normal rate and regular rhythm.   Pulmonary/Chest: Effort normal and breath sounds normal. No respiratory distress. She has no wheezes. She has no rales.  Musculoskeletal: She exhibits no edema.          Assessment & Plan:  Leg edema improved. At this point take Dyazide only as needed. Monitor blood pressure closely and start back Dyazide if consistently above 140/90. Routine followup 6 months

## 2011-11-28 ENCOUNTER — Other Ambulatory Visit: Payer: Self-pay | Admitting: Family Medicine

## 2011-11-28 DIAGNOSIS — G894 Chronic pain syndrome: Secondary | ICD-10-CM

## 2011-11-28 NOTE — Telephone Encounter (Signed)
Last filled on 10-01-11, #120 with 0 refills

## 2011-11-28 NOTE — Telephone Encounter (Signed)
Pt called req refill of oxyCODONE-acetaminophen (PERCOCET) 10-325 MG per tablet  ° °

## 2011-11-28 NOTE — Telephone Encounter (Signed)
Refill OK

## 2011-11-29 MED ORDER — OXYCODONE-ACETAMINOPHEN 10-325 MG PO TABS: 1.0000 | ORAL_TABLET | Freq: Four times a day (QID) | ORAL | Status: DC | PRN

## 2011-11-29 NOTE — Telephone Encounter (Signed)
Pt informed Rx ready to pick up

## 2011-12-05 ENCOUNTER — Other Ambulatory Visit: Payer: Self-pay | Admitting: *Deleted

## 2011-12-05 MED ORDER — SERTRALINE HCL 100 MG PO TABS: ORAL_TABLET | ORAL | Status: DC

## 2012-02-06 ENCOUNTER — Telehealth: Payer: Self-pay | Admitting: Family Medicine

## 2012-02-06 DIAGNOSIS — G894 Chronic pain syndrome: Secondary | ICD-10-CM

## 2012-02-06 NOTE — Telephone Encounter (Signed)
Pt req refill of oxyCODONE-acetaminophen (PERCOCET) 10-325 MG per tablet

## 2012-02-06 NOTE — Telephone Encounter (Signed)
Last filled 11-29-11, #120 with 0 refills

## 2012-02-06 NOTE — Telephone Encounter (Signed)
Refill OK

## 2012-02-07 MED ORDER — OXYCODONE-ACETAMINOPHEN 10-325 MG PO TABS: 1.0000 | ORAL_TABLET | Freq: Four times a day (QID) | ORAL | Status: DC | PRN

## 2012-02-07 NOTE — Telephone Encounter (Signed)
Pt informed ready to pick up

## 2012-02-26 ENCOUNTER — Ambulatory Visit: Payer: Medicare HMO | Admitting: Family

## 2012-02-28 ENCOUNTER — Encounter: Payer: Self-pay | Admitting: Family Medicine

## 2012-02-28 ENCOUNTER — Ambulatory Visit (INDEPENDENT_AMBULATORY_CARE_PROVIDER_SITE_OTHER): Payer: Medicare HMO | Admitting: Family Medicine

## 2012-02-28 VITALS — BP 150/78 | Temp 98.4°F | Wt 110.0 lb

## 2012-02-28 DIAGNOSIS — J209 Acute bronchitis, unspecified: Secondary | ICD-10-CM

## 2012-02-28 MED ORDER — HYDROCODONE-HOMATROPINE 5-1.5 MG/5ML PO SYRP: 5.0000 mL | ORAL_SOLUTION | Freq: Four times a day (QID) | ORAL | Status: DC | PRN

## 2012-02-28 MED ORDER — DOXYCYCLINE HYCLATE 100 MG PO TABS: 100.0000 mg | ORAL_TABLET | Freq: Two times a day (BID) | ORAL | Status: AC

## 2012-02-28 NOTE — Patient Instructions (Signed)
Consider cough medicine such as Delsym Follow up immediately for any fever.

## 2012-02-28 NOTE — Progress Notes (Signed)
  Subjective:    Patient ID: [name] Herman, female    DOB: 11/05/1935, 77 y.o.   MRN: [id number]  HPI  Acute visit. 2 week history of cough and congestion. Cough productive of yellow sputum especially early in the morning. No wheezing. No history of smoking. Occasional postnasal drip symptoms. Tried Robitussin-DM without much relief. Denies any fever or chills. No pleuritic pain. No hemoptysis. Denies nausea or vomiting.   Review of Systems As per history of present illness    Objective:   Physical Exam  Constitutional: She appears well-developed and well-nourished.  HENT:  Right Ear: External ear normal.  Left Ear: External ear normal.  Mouth/Throat: Oropharynx is clear and moist.  Neck: Neck supple.  Cardiovascular: Normal rate and regular rhythm.   Pulmonary/Chest: Effort normal and breath sounds normal. No respiratory distress. She has no wheezes. She has no rales.  Lymphadenopathy:    She has no cervical adenopathy.          Assessment & Plan:  Acute bronchitis. Given her age and 2 week history of productive cough start doxycycline 100 mg twice a day for 10 days. Hycodan cough syrup for nighttime use as needed. Follow up promptly for fever or persistent symptoms

## 2012-03-12 ENCOUNTER — Ambulatory Visit (INDEPENDENT_AMBULATORY_CARE_PROVIDER_SITE_OTHER): Payer: Medicare HMO | Admitting: Family Medicine

## 2012-03-12 ENCOUNTER — Encounter: Payer: Self-pay | Admitting: Family Medicine

## 2012-03-12 VITALS — BP 150/80 | HR 100 | Temp 100.1°F | Wt 105.0 lb

## 2012-03-12 DIAGNOSIS — J45909 Unspecified asthma, uncomplicated: Secondary | ICD-10-CM

## 2012-03-12 DIAGNOSIS — R062 Wheezing: Secondary | ICD-10-CM

## 2012-03-12 DIAGNOSIS — R05 Cough: Secondary | ICD-10-CM

## 2012-03-12 MED ORDER — LEVOFLOXACIN 500 MG PO TABS: 500.0000 mg | ORAL_TABLET | Freq: Every day | ORAL | Status: AC

## 2012-03-12 MED ORDER — ALBUTEROL SULFATE HFA 108 (90 BASE) MCG/ACT IN AERS: 2.0000 | INHALATION_SPRAY | RESPIRATORY_TRACT | Status: DC | PRN

## 2012-03-12 MED ORDER — METHYLPREDNISOLONE ACETATE 80 MG/ML IJ SUSP
80.0000 mg | Freq: Once | INTRAMUSCULAR | Status: AC
  Administered 2012-03-12: 80 mg via INTRAMUSCULAR

## 2012-03-12 NOTE — Progress Notes (Signed)
  Subjective:    Patient ID: [name] Herman, female    DOB: 1935-03-29, 77 y.o.   MRN: [id number]  HPI  Patient presents with persistent coughing. She had onset several weeks ago with URI type symptoms. Was seen couple weeks ago treated with doxycycline and Hycodan cough syrup. She now has persistent cough productive early morning then dry rest of the day. Chills for 2 days but no fever confirmed. Denies any nausea, vomiting, sore throat, earache, or any headache.  Patient is a nonsmoker. Past history of reported asthma. She is aware of some wheezing off and on.  No pleuritic pain and no hemoptysis.  Past Medical History  Diagnosis Date  . ANEMIA 09/25/2010  . DEPRESSION 12/02/2009  . ESSENTIAL HYPERTENSION 02/10/2010  . ASTHMA 12/02/2009  . COPD 10/03/2010  . PEPTIC ULCER DISEASE 10/23/2010  . NEPHROLITHIASIS 12/02/2009  . OSTEOARTHRITIS 01/27/2010  . Pyloric stenosis   . Peptic ulcer, unspecified site, unspecified as acute or chronic, without mention of hemorrhage, perforation, or obstruction   . Gastritis    Past Surgical History  Procedure Date  . Abdominal hysterectomy 1970  . Back surgery 1984  . Kidney surgery 1970    reports that she has never smoked. She has never used smokeless tobacco. She reports that she does not drink alcohol or use illicit drugs. family history includes Prostate cancer in her father.  There is no history of Colon cancer. Allergies  Allergen Reactions  . Meloxicam     GI upset  . Meperidine Hcl     GI upset     Review of Systems  Constitutional: Positive for chills and fatigue. Negative for fever.  HENT: Negative for sore throat.   Respiratory: Positive for cough, shortness of breath and wheezing.   Cardiovascular: Negative for chest pain, palpitations and leg swelling.  Gastrointestinal: Negative for abdominal pain.  Neurological: Negative for headaches.       Objective:   Physical Exam  Constitutional: She appears well-developed and  well-nourished. No distress.  HENT:  Right Ear: External ear normal.  Left Ear: External ear normal.  Mouth/Throat: Oropharynx is clear and moist.  Neck: Neck supple.  Cardiovascular: Normal rate and regular rhythm.   Pulmonary/Chest: Effort normal.  Lymphadenopathy:    She has no cervical adenopathy.          Assessment & Plan:  Persistent cough with likely reactive airway component. Nebulizer given. Cough improved afterwards. Levaquin 500 mg daily for 7 days. Depo-Medrol 80 mg IM given. Chest x-ray further evaluation if not promptly improving over the next couple days. Pulse oximetry today 97% and no evidence for respiratory distress  Patient cough improved following nebulizer.  prescription for Proventil inhaler 2 puffs every 4 hours when necessary

## 2012-03-12 NOTE — Patient Instructions (Signed)
Drink plenty of fluids.  Follow up promptly for any fever or worsening symptoms.

## 2012-03-24 ENCOUNTER — Other Ambulatory Visit: Payer: Self-pay | Admitting: *Deleted

## 2012-03-24 DIAGNOSIS — R05 Cough: Secondary | ICD-10-CM

## 2012-03-24 MED ORDER — HYDROCODONE-HOMATROPINE 5-1.5 MG/5ML PO SYRP: 5.0000 mL | ORAL_SOLUTION | Freq: Four times a day (QID) | ORAL | Status: AC | PRN

## 2012-03-24 NOTE — Telephone Encounter (Signed)
Pt husband informed, rx called in 

## 2012-03-24 NOTE — Telephone Encounter (Signed)
May refill Hycodan cough syrup 120 ml but I would also go ahead with CXR.  I will order but would like for her to go over the next couple of days.

## 2012-03-24 NOTE — Telephone Encounter (Signed)
Spoke with pt husband, he reports "she is still coughing her head off, requesting refill of cough med"

## 2012-03-25 ENCOUNTER — Ambulatory Visit (INDEPENDENT_AMBULATORY_CARE_PROVIDER_SITE_OTHER)
Admission: RE | Admit: 2012-03-25 | Discharge: 2012-03-25 | Disposition: A | Discharge status: Home / Self Care | Payer: Medicare HMO | Source: Ambulatory Visit | Attending: Family Medicine | Admitting: Family Medicine

## 2012-03-25 DIAGNOSIS — R05 Cough: Secondary | ICD-10-CM

## 2012-03-25 DIAGNOSIS — R059 Cough, unspecified: Secondary | ICD-10-CM

## 2012-03-25 NOTE — Progress Notes (Signed)
Quick Note:  Spoke with pt - informed cxr stable - no further questions ______

## 2012-04-07 ENCOUNTER — Other Ambulatory Visit: Payer: Self-pay | Admitting: Family Medicine

## 2012-04-15 ENCOUNTER — Other Ambulatory Visit: Payer: Self-pay | Admitting: Gastroenterology

## 2012-05-07 ENCOUNTER — Telehealth: Payer: Self-pay | Admitting: Family Medicine

## 2012-05-07 ENCOUNTER — Other Ambulatory Visit: Payer: Self-pay | Admitting: Gastroenterology

## 2012-05-07 NOTE — Telephone Encounter (Signed)
Pantoprazole is a generic for Protonix and not Nexium.  Should be OK to change if this will help her with cost.   Protonix (pantoprazole) 40 mg one po daily #90 with 3 refills

## 2012-05-07 NOTE — Telephone Encounter (Signed)
Pt called and is req to change to the generic for Nexium (Pantoprazole). [facility]s [facility] to [facility] [address].

## 2012-05-08 MED ORDER — PANTOPRAZOLE SODIUM 40 MG PO TBEC: 40.0000 mg | DELAYED_RELEASE_TABLET | Freq: Every day | ORAL | Status: DC

## 2012-05-12 ENCOUNTER — Other Ambulatory Visit: Payer: Self-pay | Admitting: Family Medicine

## 2012-05-12 DIAGNOSIS — G894 Chronic pain syndrome: Secondary | ICD-10-CM

## 2012-05-12 MED ORDER — OXYCODONE-ACETAMINOPHEN 10-325 MG PO TABS: 1.0000 | ORAL_TABLET | Freq: Four times a day (QID) | ORAL | Status: DC | PRN

## 2012-05-12 NOTE — Telephone Encounter (Signed)
Oxycodone last filled 02-07-12, #120 with 0 refills

## 2012-05-12 NOTE — Telephone Encounter (Signed)
OK to refill

## 2012-05-12 NOTE — Telephone Encounter (Signed)
Husband informed ready to pick-up

## 2012-05-12 NOTE — Telephone Encounter (Signed)
Pt needs new rx oxycodone °

## 2012-08-18 ENCOUNTER — Telehealth: Payer: Self-pay | Admitting: Family Medicine

## 2012-08-18 DIAGNOSIS — G894 Chronic pain syndrome: Secondary | ICD-10-CM

## 2012-08-18 MED ORDER — OXYCODONE-ACETAMINOPHEN 10-325 MG PO TABS: 1.0000 | ORAL_TABLET | Freq: Four times a day (QID) | ORAL | Status: DC | PRN

## 2012-08-18 NOTE — Telephone Encounter (Signed)
Refill OK

## 2012-08-18 NOTE — Telephone Encounter (Signed)
Patient called stating that she need a refill of her oxycodone. Please assist.  °

## 2012-08-18 NOTE — Telephone Encounter (Signed)
Last filled 05-12-12, #120 with 0 refills

## 2012-08-18 NOTE — Addendum Note (Signed)
Addended by: Melchor Amour on: 08/18/2012 05:42 PM   Modules accepted: Orders

## 2012-12-01 ENCOUNTER — Other Ambulatory Visit: Payer: Self-pay | Admitting: Family Medicine

## 2012-12-01 DIAGNOSIS — G894 Chronic pain syndrome: Secondary | ICD-10-CM

## 2012-12-01 NOTE — Telephone Encounter (Signed)
Pt would like refill of oxyCODONE-acetaminophen (PERCOCET) 10-325 MG per tablet. Thank you!

## 2012-12-02 NOTE — Telephone Encounter (Signed)
Refill OK

## 2012-12-02 NOTE — Telephone Encounter (Signed)
Oxycodone 10-325 last filled 08-18-12, #120 with 0 refills

## 2012-12-03 MED ORDER — OXYCODONE-ACETAMINOPHEN 10-325 MG PO TABS: 1.0000 | ORAL_TABLET | Freq: Four times a day (QID) | ORAL | Status: DC | PRN

## 2012-12-03 NOTE — Telephone Encounter (Signed)
Pt aware.

## 2012-12-04 ENCOUNTER — Ambulatory Visit (INDEPENDENT_AMBULATORY_CARE_PROVIDER_SITE_OTHER): Payer: Medicare HMO | Admitting: Family Medicine

## 2012-12-04 DIAGNOSIS — Z23 Encounter for immunization: Secondary | ICD-10-CM

## 2013-01-10 ENCOUNTER — Other Ambulatory Visit: Payer: Self-pay | Admitting: Family Medicine

## 2013-01-28 ENCOUNTER — Encounter: Payer: Self-pay | Admitting: Family Medicine

## 2013-01-28 ENCOUNTER — Ambulatory Visit (INDEPENDENT_AMBULATORY_CARE_PROVIDER_SITE_OTHER): Payer: Medicare HMO | Admitting: Family Medicine

## 2013-01-28 VITALS — BP 130/78 | Temp 98.0°F

## 2013-01-28 DIAGNOSIS — J069 Acute upper respiratory infection, unspecified: Secondary | ICD-10-CM

## 2013-01-28 MED ORDER — HYDROCOD POLST-CHLORPHEN POLST 10-8 MG/5ML PO LQCR: 5.0000 mL | Freq: Two times a day (BID) | ORAL | Status: DC | PRN

## 2013-01-28 NOTE — Patient Instructions (Addendum)
DONT take Tussionex with Percocet. Follow up promptly for any fever or shortness of breath.

## 2013-01-28 NOTE — Progress Notes (Signed)
  Subjective:    Patient ID: [name] Herman, female    DOB: 08/16/1935, 78 y.o.   MRN: [id number]  HPI  Sunday onset of sore throat, cough, nasal congestion. No myalgias.  Coughing keeping awake. Mostly nonproductive. No fever. Some chills. No nausea, vomiting, or diarrhea. ?mild wheezing.    Past Medical History  Diagnosis Date  . ANEMIA 09/25/2010  . DEPRESSION 12/02/2009  . ESSENTIAL HYPERTENSION 02/10/2010  . ASTHMA 12/02/2009  . COPD 10/03/2010  . PEPTIC ULCER DISEASE 10/23/2010  . NEPHROLITHIASIS 12/02/2009  . OSTEOARTHRITIS 01/27/2010  . Pyloric stenosis   . Peptic ulcer, unspecified site, unspecified as acute or chronic, without mention of hemorrhage, perforation, or obstruction   . Gastritis    Past Surgical History  Procedure Laterality Date  . Abdominal hysterectomy  1970  . Back surgery  1984  . Kidney surgery  1970    reports that she has never smoked. She has never used smokeless tobacco. She reports that she does not drink alcohol or use illicit drugs. family history includes Prostate cancer in her father.  There is no history of Colon cancer. Allergies  Allergen Reactions  . Meloxicam     GI upset  . Meperidine Hcl     GI upset     Review of Systems  Constitutional: Negative for fever and chills.  HENT: Positive for congestion and sore throat.   Respiratory: Positive for cough. Negative for shortness of breath and wheezing.        Objective:   Physical Exam  Constitutional: She appears well-developed and well-nourished.  Frequently coughing during exam but in no respiratory distress  HENT:  Right Ear: External ear normal.  Left Ear: External ear normal.  Mouth/Throat: Oropharynx is clear and moist.  Neck: Neck supple.  Cardiovascular: Normal rate and regular rhythm.   Pulmonary/Chest: Effort normal and breath sounds normal. No respiratory distress. She has no wheezes. She has no rales.  Lymphadenopathy:    She has no cervical adenopathy.          Assessment & Plan:  Cough. Suspect secondary to viral URI. Tussionex 1 teaspoon each bedtime as needed with caution for potential side effects. Do not take any other sedatives or Percocet concomitant with this medication and patient aware

## 2013-03-10 ENCOUNTER — Telehealth: Payer: Self-pay | Admitting: Family Medicine

## 2013-03-10 DIAGNOSIS — G894 Chronic pain syndrome: Secondary | ICD-10-CM

## 2013-03-10 NOTE — Telephone Encounter (Signed)
Refill OK

## 2013-03-10 NOTE — Telephone Encounter (Signed)
Patient called stating that she need a refill of her oxycodone 10-325 mg 1po 4 times qd prn for pain. Please assist.

## 2013-03-10 NOTE — Telephone Encounter (Signed)
Oxycodone every 6 hours prn, last filled 12-03-12, #120 with 0 refills

## 2013-03-11 MED ORDER — OXYCODONE-ACETAMINOPHEN 10-325 MG PO TABS: 1.0000 | ORAL_TABLET | Freq: Four times a day (QID) | ORAL | Status: DC | PRN

## 2013-03-11 NOTE — Telephone Encounter (Signed)
Husband informed Rx ready to pick-up

## 2013-05-19 ENCOUNTER — Other Ambulatory Visit: Payer: Self-pay | Admitting: Family Medicine

## 2013-05-20 NOTE — Telephone Encounter (Signed)
Rx request to pharmacy/[facility]  

## 2013-06-15 ENCOUNTER — Telehealth: Payer: Self-pay | Admitting: Family Medicine

## 2013-06-15 DIAGNOSIS — G894 Chronic pain syndrome: Secondary | ICD-10-CM

## 2013-06-15 NOTE — Telephone Encounter (Signed)
Pt needs refill on oxyCODONE-acetaminophen (PERCOCET) 10-325 MG per tablet. 1/every 6 hrs prn

## 2013-06-15 NOTE — Telephone Encounter (Signed)
Last refill 03/11/13 #120 no refills 01/28/13 Last seen

## 2013-06-15 NOTE — Telephone Encounter (Signed)
Refill once OK.  She does not take this regularly.

## 2013-06-16 MED ORDER — OXYCODONE-ACETAMINOPHEN 10-325 MG PO TABS: 1.0000 | ORAL_TABLET | Freq: Four times a day (QID) | ORAL | Status: DC | PRN

## 2013-06-16 NOTE — Telephone Encounter (Signed)
Patient notified that rx is ready for pick up

## 2013-07-07 ENCOUNTER — Telehealth: Payer: Self-pay | Admitting: Family Medicine

## 2013-07-07 MED ORDER — PREDNISONE (PAK) 10 MG PO TABS: 10.0000 mg | ORAL_TABLET | Freq: Every day | ORAL | Status: DC

## 2013-07-07 NOTE — Telephone Encounter (Signed)
Sent rx to pharmacy

## 2013-07-07 NOTE — Telephone Encounter (Signed)
Pt has had poison oak several days. Husband iis in [facility]. Pt request a med for the poison oak. pls advise [facility] [address]

## 2013-07-07 NOTE — Telephone Encounter (Signed)
Ideally should be seen but I know circumstances are difficult.  Prednisone 10 mg taper: 4-4-4-4-3-3-3-2-2-1-1 disp #31

## 2013-08-25 ENCOUNTER — Ambulatory Visit (INDEPENDENT_AMBULATORY_CARE_PROVIDER_SITE_OTHER): Payer: Medicare HMO | Admitting: Family Medicine

## 2013-08-25 ENCOUNTER — Encounter: Payer: Self-pay | Admitting: Family Medicine

## 2013-08-25 VITALS — BP 130/70 | HR 85 | Temp 98.3°F | Wt 113.0 lb

## 2013-08-25 DIAGNOSIS — I10 Essential (primary) hypertension: Secondary | ICD-10-CM

## 2013-08-25 DIAGNOSIS — F329 Major depressive disorder, single episode, unspecified: Secondary | ICD-10-CM

## 2013-08-25 DIAGNOSIS — J45909 Unspecified asthma, uncomplicated: Secondary | ICD-10-CM

## 2013-08-25 DIAGNOSIS — R05 Cough: Secondary | ICD-10-CM

## 2013-08-25 DIAGNOSIS — Z23 Encounter for immunization: Secondary | ICD-10-CM

## 2013-08-25 MED ORDER — MOMETASONE FUROATE 220 MCG/INH IN AEPB: 2.0000 | INHALATION_SPRAY | Freq: Every day | RESPIRATORY_TRACT | Status: DC

## 2013-08-25 MED ORDER — ALBUTEROL SULFATE HFA 108 (90 BASE) MCG/ACT IN AERS: 2.0000 | INHALATION_SPRAY | RESPIRATORY_TRACT | Status: DC | PRN

## 2013-08-25 NOTE — Patient Instructions (Signed)
Please call [name] Herman in 2-3 weeks if cough not resolving.

## 2013-08-25 NOTE — Progress Notes (Signed)
  Subjective:    Patient ID: [name] Herman, female    DOB: June 10, 1935, 78 y.o.   MRN: [id number]  HPI Patient for medical followup. She just lost her husband to cardiac disease little over a month ago. She has very supportive family and friends and overall is doing well.  She has history of depression which is currently stable on sertraline. Other medical problems include history of asthma, COPD, hypertension, osteoarthritis. Medications reviewed. She recently ran out of Asmanex has had increased coughing over the past several weeks. She is requesting refills as well as albuterol. She needs flu vaccine. Pneumovax is up-to-date. Cough has been dry and nonproductive. No hemoptysis. She's noticed some wheezing off and on. Denies postnasal drip. No active GERD symptoms.  Hypertension treated with triamterene hydrochlorothiazide. Blood pressures have been stable.  Past Medical History  Diagnosis Date  . ANEMIA 09/25/2010  . DEPRESSION 12/02/2009  . ESSENTIAL HYPERTENSION 02/10/2010  . ASTHMA 12/02/2009  . COPD 10/03/2010  . PEPTIC ULCER DISEASE 10/23/2010  . NEPHROLITHIASIS 12/02/2009  . OSTEOARTHRITIS 01/27/2010  . Pyloric stenosis   . Peptic ulcer, unspecified site, unspecified as acute or chronic, without mention of hemorrhage, perforation, or obstruction   . Gastritis    Past Surgical History  Procedure Laterality Date  . Abdominal hysterectomy  1970  . Back surgery  1984  . Kidney surgery  1970    reports that she has never smoked. She has never used smokeless tobacco. She reports that she does not drink alcohol or use illicit drugs. family history includes Prostate cancer in her father. There is no history of Colon cancer. Allergies  Allergen Reactions  . Meloxicam     GI upset  . Meperidine Hcl     GI upset      Review of Systems  Constitutional: Negative for appetite change, fatigue and unexpected weight change.  Eyes: Negative for visual disturbance.  Respiratory: Positive for  cough, shortness of breath and wheezing. Negative for chest tightness.   Cardiovascular: Negative for chest pain, palpitations and leg swelling.  Endocrine: Negative for polydipsia and polyuria.  Genitourinary: Negative for dysuria.  Neurological: Negative for dizziness, seizures, syncope, weakness, light-headedness and headaches.       Objective:   Physical Exam  Constitutional: She appears well-developed and well-nourished.  HENT:  Mouth/Throat: Oropharynx is clear and moist.  Neck: Neck supple. No thyromegaly present.  Cardiovascular: Normal rate and regular rhythm.   Pulmonary/Chest: Effort normal and breath sounds normal. No respiratory distress. She has no wheezes. She has no rales.  Musculoskeletal: She exhibits no edema.          Assessment & Plan:  #1 asthma currently not on controller medication. Start back Asmanex 2 puffs daily. Refill albuterol for as needed use. Flu vaccine given. #2 hypertension. Stable and at goal. Continue Dyazide #3 depression. Reactive with recent loss of husband. Overall coping well with sertraline. #4 osteoarthritis. She infrequently uses oxycodone.

## 2013-10-27 ENCOUNTER — Telehealth: Payer: Self-pay | Admitting: Family Medicine

## 2013-10-27 DIAGNOSIS — G894 Chronic pain syndrome: Secondary | ICD-10-CM

## 2013-10-27 NOTE — Telephone Encounter (Signed)
Refill OK

## 2013-10-27 NOTE — Telephone Encounter (Signed)
Last visit 08/25/13 Last refill 06/16/13 #120 0 refill

## 2013-10-27 NOTE — Telephone Encounter (Signed)
Requesting refill of oxyCODONE-acetaminophen (PERCOCET) 10-325 MG per tablet ° °

## 2013-10-28 MED ORDER — OXYCODONE-ACETAMINOPHEN 10-325 MG PO TABS: 1.0000 | ORAL_TABLET | Freq: Four times a day (QID) | ORAL | Status: DC | PRN

## 2013-10-28 NOTE — Telephone Encounter (Signed)
Left message VM that RX is ready for pick up

## 2013-11-11 ENCOUNTER — Other Ambulatory Visit: Payer: Self-pay | Admitting: Family Medicine

## 2013-12-21 ENCOUNTER — Ambulatory Visit (INDEPENDENT_AMBULATORY_CARE_PROVIDER_SITE_OTHER): Payer: Medicare HMO | Admitting: Family Medicine

## 2013-12-21 ENCOUNTER — Encounter: Payer: Self-pay | Admitting: Family Medicine

## 2013-12-21 VITALS — BP 132/76 | HR 86 | Temp 98.0°F | Wt 115.0 lb

## 2013-12-21 DIAGNOSIS — R059 Cough, unspecified: Secondary | ICD-10-CM

## 2013-12-21 DIAGNOSIS — R05 Cough: Secondary | ICD-10-CM

## 2013-12-21 DIAGNOSIS — J040 Acute laryngitis: Secondary | ICD-10-CM

## 2013-12-21 MED ORDER — HYDROCOD POLST-CHLORPHEN POLST 10-8 MG/5ML PO LQCR: 5.0000 mL | Freq: Two times a day (BID) | ORAL | Status: DC | PRN

## 2013-12-21 MED ORDER — AZITHROMYCIN 250 MG PO TABS: ORAL_TABLET | ORAL | Status: AC

## 2013-12-21 NOTE — Progress Notes (Signed)
Pre visit review using our clinic review tool, if applicable. No additional management support is needed unless otherwise documented below in the visit note. 

## 2013-12-21 NOTE — Progress Notes (Signed)
   Subjective:    Patient ID: [name] Herman, female    DOB: 03/01/1935, 79 y.o.   MRN: [id number]  HPI  Patient seen with fairly severe cough for over one week duration. She then developed some hoarseness of the past few days. Her cough has been productive of yellow-green sputum. She has not had any fever but feels she may have some mild wheezing off and on. She has albuterol inhaler that she uses occasionally. Denies any headaches. No syncope. Nonsmoker. No pleuritic pain. No hemoptysis. No recent appetite changes.  Past Medical History  Diagnosis Date  . ANEMIA 09/25/2010  . DEPRESSION 12/02/2009  . ESSENTIAL HYPERTENSION 02/10/2010  . ASTHMA 12/02/2009  . COPD 10/03/2010  . PEPTIC ULCER DISEASE 10/23/2010  . NEPHROLITHIASIS 12/02/2009  . OSTEOARTHRITIS 01/27/2010  . Pyloric stenosis   . Peptic ulcer, unspecified site, unspecified as acute or chronic, without mention of hemorrhage, perforation, or obstruction   . Gastritis    Past Surgical History  Procedure Laterality Date  . Abdominal hysterectomy  1970  . Back surgery  1984  . Kidney surgery  1970    reports that she has never smoked. She has never used smokeless tobacco. She reports that she does not drink alcohol or use illicit drugs. family history includes Prostate cancer in her father. There is no history of Colon cancer. Allergies  Allergen Reactions  . Meloxicam     GI upset  . Meperidine Hcl     GI upset   And   Review of Systems  Constitutional: Positive for fatigue.  HENT: Positive for congestion and voice change. Negative for sore throat and trouble swallowing.   Respiratory: Positive for cough.   Cardiovascular: Negative for chest pain.       Objective:   Physical Exam  Constitutional: She appears well-developed and well-nourished.  HENT:  Mouth/Throat: Oropharynx is clear and moist.  Neck: Neck supple.  Cardiovascular: Normal rate.   Pulmonary/Chest: Effort normal and breath sounds normal. No  respiratory distress. She has no wheezes. She has no rales.  Lymphadenopathy:    She has no cervical adenopathy.          Assessment & Plan:  Cough with laryngitis. We explained these are usually viral but given her age and worsening symptoms start Zithromax. Limited Tussionex 1 teaspoon each bedtime for severe cough

## 2013-12-21 NOTE — Patient Instructions (Signed)
Acute Bronchitis Bronchitis is inflammation of the airways that extend from the windpipe into the lungs (bronchi). The inflammation often causes mucus to develop. This leads to a cough, which is the most common symptom of bronchitis.  In acute bronchitis, the condition usually develops suddenly and goes away over time, usually in a couple weeks. Smoking, allergies, and asthma can make bronchitis worse. Repeated episodes of bronchitis may cause further lung problems.  CAUSES Acute bronchitis is most often caused by the same virus that causes a cold. The virus can spread from person to person (contagious).  SIGNS AND SYMPTOMS   Cough.   Fever.   Coughing up mucus.   Body aches.   Chest congestion.   Chills.   Shortness of breath.   Sore throat.  DIAGNOSIS  Acute bronchitis is usually diagnosed through a physical exam. Tests, such as chest X-rays, are sometimes done to rule out other conditions.  TREATMENT  Acute bronchitis usually goes away in a couple weeks. Often times, no medical treatment is necessary. Medicines are sometimes given for relief of fever or cough. Antibiotics are usually not needed but may be prescribed in certain situations. In some cases, an inhaler may be recommended to help reduce shortness of breath and control the cough. A cool mist vaporizer may also be used to help thin bronchial secretions and make it easier to clear the chest.  HOME CARE INSTRUCTIONS  Get plenty of rest.   Drink enough fluids to keep your urine clear or pale yellow (unless you have a medical condition that requires fluid restriction). Increasing fluids may help thin your secretions and will prevent dehydration.   Only take over-the-counter or prescription medicines as directed by your health care provider.   Avoid smoking and secondhand smoke. Exposure to cigarette smoke or irritating chemicals will make bronchitis worse. If you are a smoker, consider using nicotine gum or skin  patches to help control withdrawal symptoms. Quitting smoking will help your lungs heal faster.   Reduce the chances of another bout of acute bronchitis by washing your hands frequently, avoiding people with cold symptoms, and trying not to touch your hands to your mouth, nose, or eyes.   Follow up with your health care provider as directed.  SEEK MEDICAL CARE IF: Your symptoms do not improve after 1 week of treatment.  SEEK IMMEDIATE MEDICAL CARE IF:  You develop an increased fever or chills.   You have chest pain.   You have severe shortness of breath.  You have bloody sputum.   You develop dehydration.  You develop fainting.  You develop repeated vomiting.  You develop a severe headache. MAKE SURE YOU:   Understand these instructions.  Will watch your condition.  Will get help right away if you are not doing well or get worse. Document Released: 12/20/2004 Document Revised: 07/15/2013 Document Reviewed: 05/05/2013 [facility] CampusExit[facility] Patient Information 2014 [place], [place].  Do not take the Oxycodone while you are on the Tussionex.

## 2013-12-30 ENCOUNTER — Ambulatory Visit (INDEPENDENT_AMBULATORY_CARE_PROVIDER_SITE_OTHER)
Admission: RE | Admit: 2013-12-30 | Discharge: 2013-12-30 | Disposition: A | Discharge status: Home / Self Care | Payer: Medicare HMO | Source: Ambulatory Visit | Attending: Family Medicine | Admitting: Family Medicine

## 2013-12-30 ENCOUNTER — Ambulatory Visit (INDEPENDENT_AMBULATORY_CARE_PROVIDER_SITE_OTHER): Payer: Medicare HMO | Admitting: Family Medicine

## 2013-12-30 ENCOUNTER — Encounter: Payer: Self-pay | Admitting: Family Medicine

## 2013-12-30 VITALS — BP 130/76 | HR 89 | Temp 97.7°F | Wt 113.0 lb

## 2013-12-30 DIAGNOSIS — R059 Cough, unspecified: Secondary | ICD-10-CM

## 2013-12-30 DIAGNOSIS — R05 Cough: Secondary | ICD-10-CM

## 2013-12-30 MED ORDER — TRAMADOL HCL 50 MG PO TABS: 50.0000 mg | ORAL_TABLET | Freq: Four times a day (QID) | ORAL | Status: DC | PRN

## 2013-12-30 NOTE — Progress Notes (Signed)
Pre visit review using our clinic review tool, if applicable. No additional management support is needed unless otherwise documented below in the visit note. 

## 2013-12-30 NOTE — Progress Notes (Signed)
   Subjective:    Patient ID: [name] Herman, female    DOB: 05/22/1935, 79 y.o.   MRN: [id number]  Cough Pertinent negatives include no chest pain, chills or shortness of breath.   Persistent cough Duration about 2 weeks. Refer to recent note. Treated with Zithromax and Tussionex at night. Cough remains mostly nonproductive occasionally productive of yellow sputum. No fever. Possible mild dyspnea. No clear wheezing. Using albuterol without much improvement. Patient never smoked She's also had some persistent laryngitis. No GERD. No significant postnasal drip  Past Medical History  Diagnosis Date  . ANEMIA 09/25/2010  . DEPRESSION 12/02/2009  . ESSENTIAL HYPERTENSION 02/10/2010  . ASTHMA 12/02/2009  . COPD 10/03/2010  . PEPTIC ULCER DISEASE 10/23/2010  . NEPHROLITHIASIS 12/02/2009  . OSTEOARTHRITIS 01/27/2010  . Pyloric stenosis   . Peptic ulcer, unspecified site, unspecified as acute or chronic, without mention of hemorrhage, perforation, or obstruction   . Gastritis    Past Surgical History  Procedure Laterality Date  . Abdominal hysterectomy  1970  . Back surgery  1984  . Kidney surgery  1970    reports that she has never smoked. She has never used smokeless tobacco. She reports that she does not drink alcohol or use illicit drugs. family history includes Prostate cancer in her father. There is no history of Colon cancer. Allergies  Allergen Reactions  . Meloxicam     GI upset  . Meperidine Hcl     GI upset      Review of Systems  Constitutional: Negative for chills.  HENT: Positive for voice change. Negative for sinus pressure.   Respiratory: Positive for cough. Negative for shortness of breath.   Cardiovascular: Negative for chest pain.       Objective:   Physical Exam  Constitutional: She appears well-developed and well-nourished.  HENT:  Right Ear: External ear normal.  Left Ear: External ear normal.  Mouth/Throat: Oropharynx is clear and moist.  Neck: Neck  supple.  Cardiovascular: Normal rate.   Pulmonary/Chest: Effort normal and breath sounds normal. No respiratory distress. She has no wheezes. She has no rales.          Assessment & Plan:  Persistent cough. Nonfocal exam. Suspect this is probably viral but with her age will check chest x-ray. She's not any recent fever. Consider trial of tramadol 50 mg every 6 hours as needed for cough.

## 2014-01-25 ENCOUNTER — Other Ambulatory Visit: Payer: Self-pay | Admitting: Family Medicine

## 2014-03-11 ENCOUNTER — Telehealth: Payer: Self-pay | Admitting: Family Medicine

## 2014-03-11 DIAGNOSIS — G894 Chronic pain syndrome: Secondary | ICD-10-CM

## 2014-03-11 NOTE — Telephone Encounter (Signed)
Pt req rx on oxyCODONE-acetaminophen (PERCOCET) 10-325 MG per tablet ° °

## 2014-03-11 NOTE — Telephone Encounter (Signed)
Refill OK

## 2014-03-11 NOTE — Telephone Encounter (Signed)
Last visit 12/30/13 Last refill 10/28/13 #120 0 refill

## 2014-03-12 MED ORDER — OXYCODONE-ACETAMINOPHEN 10-325 MG PO TABS: 1.0000 | ORAL_TABLET | Freq: Four times a day (QID) | ORAL | Status: DC | PRN

## 2014-03-12 NOTE — Telephone Encounter (Signed)
Pt informed RX is ready for pick up.

## 2014-03-29 ENCOUNTER — Other Ambulatory Visit: Payer: Self-pay | Admitting: Oncology

## 2014-05-10 ENCOUNTER — Other Ambulatory Visit: Payer: Self-pay | Admitting: Family Medicine

## 2014-06-22 ENCOUNTER — Encounter: Payer: Self-pay | Admitting: Family Medicine

## 2014-06-22 ENCOUNTER — Ambulatory Visit (INDEPENDENT_AMBULATORY_CARE_PROVIDER_SITE_OTHER): Payer: Medicare HMO | Admitting: Family Medicine

## 2014-06-22 VITALS — BP 130/80 | HR 83 | Temp 98.6°F | Wt 111.0 lb

## 2014-06-22 DIAGNOSIS — J209 Acute bronchitis, unspecified: Secondary | ICD-10-CM

## 2014-06-22 DIAGNOSIS — H109 Unspecified conjunctivitis: Secondary | ICD-10-CM

## 2014-06-22 DIAGNOSIS — H1089 Other conjunctivitis: Secondary | ICD-10-CM

## 2014-06-22 DIAGNOSIS — G894 Chronic pain syndrome: Secondary | ICD-10-CM

## 2014-06-22 DIAGNOSIS — B9689 Other specified bacterial agents as the cause of diseases classified elsewhere: Secondary | ICD-10-CM

## 2014-06-22 DIAGNOSIS — A499 Bacterial infection, unspecified: Secondary | ICD-10-CM

## 2014-06-22 MED ORDER — POLYMYXIN B-TRIMETHOPRIM 10000-0.1 UNIT/ML-% OP SOLN: 2.0000 [drp] | OPHTHALMIC | Status: DC

## 2014-06-22 MED ORDER — AZITHROMYCIN 250 MG PO TABS: ORAL_TABLET | ORAL | Status: AC

## 2014-06-22 MED ORDER — OXYCODONE-ACETAMINOPHEN 10-325 MG PO TABS: 1.0000 | ORAL_TABLET | Freq: Four times a day (QID) | ORAL | Status: DC | PRN

## 2014-06-22 NOTE — Patient Instructions (Signed)
Acute Bronchitis Bronchitis is inflammation of the airways that extend from the windpipe into the lungs (bronchi). The inflammation often causes mucus to develop. This leads to a cough, which is the most common symptom of bronchitis.  In acute bronchitis, the condition usually develops suddenly and goes away over time, usually in a couple weeks. Smoking, allergies, and asthma can make bronchitis worse. Repeated episodes of bronchitis may cause further lung problems.  CAUSES Acute bronchitis is most often caused by the same virus that causes a cold. The virus can spread from person to person (contagious) through coughing, sneezing, and touching contaminated objects. SIGNS AND SYMPTOMS   Cough.   Fever.   Coughing up mucus.   Body aches.   Chest congestion.   Chills.   Shortness of breath.   Sore throat.  DIAGNOSIS  Acute bronchitis is usually diagnosed through a physical exam. Your health care provider will also ask you questions about your medical history. Tests, such as chest X-rays, are sometimes done to rule out other conditions.  TREATMENT  Acute bronchitis usually goes away in a couple weeks. Oftentimes, no medical treatment is necessary. Medicines are sometimes given for relief of fever or cough. Antibiotic medicines are usually not needed but may be prescribed in certain situations. In some cases, an inhaler may be recommended to help reduce shortness of breath and control the cough. A cool mist vaporizer may also be used to help thin bronchial secretions and make it easier to clear the chest.  HOME CARE INSTRUCTIONS  Get plenty of rest.   Drink enough fluids to keep your urine clear or pale yellow (unless you have a medical condition that requires fluid restriction). Increasing fluids may help thin your respiratory secretions (sputum) and reduce chest congestion, and it will prevent dehydration.   Take medicines only as directed by your health care provider.  If  you were prescribed an antibiotic medicine, finish it all even if you start to feel better.  Avoid smoking and secondhand smoke. Exposure to cigarette smoke or irritating chemicals will make bronchitis worse. If you are a smoker, consider using nicotine gum or skin patches to help control withdrawal symptoms. Quitting smoking will help your lungs heal faster.   Reduce the chances of another bout of acute bronchitis by washing your hands frequently, avoiding people with cold symptoms, and trying not to touch your hands to your mouth, nose, or eyes.   Keep all follow-up visits as directed by your health care provider.  SEEK MEDICAL CARE IF: Your symptoms do not improve after 1 week of treatment.  SEEK IMMEDIATE MEDICAL CARE IF:  You develop an increased fever or chills.   You have chest pain.   You have severe shortness of breath.  You have bloody sputum.   You develop dehydration.  You faint or repeatedly feel like you are going to pass out.  You develop repeated vomiting.  You develop a severe headache. MAKE SURE YOU:   Understand these instructions.  Will watch your condition.  Will get help right away if you are not doing well or get worse. Document Released: 12/20/2004 Document Revised: 03/29/2014 Document Reviewed: 05/05/2013 Exit[employer] Patient Information 2015 [employer]. This information is not intended to replace advice given to you by your health care provider. Make sure you discuss any questions you have with your health care provider. Conjunctivitis Conjunctivitis is commonly called "pink eye." Conjunctivitis can be caused by bacterial or viral infection, allergies, or injuries. There is usually redness of   the lining of the eye, itching, discomfort, and sometimes discharge. There may be deposits of matter along the eyelids. A viral infection usually causes a watery discharge, while a bacterial infection causes a yellowish, thick discharge. Pink eye is very  contagious and spreads by direct contact. You may be given antibiotic eyedrops as part of your treatment. Before using your eye medicine, remove all drainage from the eye by washing gently with warm water and cotton balls. Continue to use the medication until you have awakened 2 mornings in a row without discharge from the eye. Do not rub your eye. This increases the irritation and helps spread infection. Use separate towels from other household members. Wash your hands with soap and water before and after touching your eyes. Use cold compresses to reduce pain and sunglasses to relieve irritation from light. Do not wear contact lenses or wear eye makeup until the infection is gone. SEEK MEDICAL CARE IF:   Your symptoms are not better after 3 days of treatment.  You have increased pain or trouble seeing.  The outer eyelids become very red or swollen. Document Released: 12/20/2004 Document Revised: 02/04/2012 Document Reviewed: 11/12/2005 [employer] Patient Information 2015 [employer]. This information is not intended to replace advice given to you by your health care provider. Make sure you discuss any questions you have with your health care provider.  

## 2014-06-22 NOTE — Progress Notes (Signed)
   Subjective:    Patient ID: [name] Herman, female    DOB: 06/22/1935, 79 y.o.   MRN: [id number]  Cough Associated symptoms include eye redness. Pertinent negatives include no chest pain, chills, fever or wheezing.   Seen for the following  Productive cough over the past week. Early-morning production of green sputum. No fever. She's had increased hoarseness. Increased fatigue. No dyspnea. No obvious wheezing.  Left eye redness and matting for the past 2 days. No eye injury. No pain. No blurred vision. No contact use. Using warm compresses.  Requesting refill oxycodone. She does not take this regularly. She has some chronic arthritic pains and takes this only as needed for severe pain. No history of misuse. No major constipation issues.  She has tried over the counter medications without good relief.  Past Medical History  Diagnosis Date  . ANEMIA 09/25/2010  . DEPRESSION 12/02/2009  . ESSENTIAL HYPERTENSION 02/10/2010  . ASTHMA 12/02/2009  . COPD 10/03/2010  . PEPTIC ULCER DISEASE 10/23/2010  . NEPHROLITHIASIS 12/02/2009  . OSTEOARTHRITIS 01/27/2010  . Pyloric stenosis   . Peptic ulcer, unspecified site, unspecified as acute or chronic, without mention of hemorrhage, perforation, or obstruction   . Gastritis    Past Surgical History  Procedure Laterality Date  . Abdominal hysterectomy  1970  . Back surgery  1984  . Kidney surgery  1970    reports that she has never smoked. She has never used smokeless tobacco. She reports that she does not drink alcohol or use illicit drugs. family history includes Prostate cancer in her father. There is no history of Colon cancer. Allergies  Allergen Reactions  . Meloxicam     GI upset  . Meperidine Hcl     GI upset      Review of Systems  Constitutional: Positive for fatigue. Negative for fever and chills.  HENT: Positive for congestion.   Eyes: Positive for discharge and redness. Negative for photophobia, pain and visual disturbance.    Respiratory: Positive for cough. Negative for wheezing.   Cardiovascular: Negative for chest pain.       Objective:   Physical Exam  Constitutional: She appears well-developed and well-nourished.  HENT:  Mouth/Throat: Oropharynx is clear and moist.  Eyes:  Left conjunctiva erythematous No visible purulent drainage at this time.  Cardiovascular: Normal rate and regular rhythm.   Pulmonary/Chest: Effort normal and breath sounds normal. No respiratory distress. She has no wheezes. She has no rales.          Assessment & Plan:  #1 productive cough. Given duration of symptoms and worsening symptoms start Zithromax. Followup promptly for any fever #2 conjunctivitis, probably bacterial left eye. Polytrim ophthalmic drops and continue warm compresses #3 osteoarthritis involving multiple joints. Refill Percocet for as needed use.

## 2014-07-17 ENCOUNTER — Encounter: Payer: Self-pay | Admitting: Gastroenterology

## 2014-08-19 ENCOUNTER — Other Ambulatory Visit: Payer: Self-pay | Admitting: Family Medicine

## 2014-11-01 ENCOUNTER — Encounter: Payer: Self-pay | Admitting: Family Medicine

## 2014-11-01 ENCOUNTER — Ambulatory Visit (INDEPENDENT_AMBULATORY_CARE_PROVIDER_SITE_OTHER): Payer: Medicare HMO

## 2014-11-01 ENCOUNTER — Ambulatory Visit (INDEPENDENT_AMBULATORY_CARE_PROVIDER_SITE_OTHER): Payer: Medicare HMO | Admitting: Family Medicine

## 2014-11-01 VITALS — BP 130/78 | HR 88 | Temp 98.1°F | Wt 113.0 lb

## 2014-11-01 DIAGNOSIS — Z23 Encounter for immunization: Secondary | ICD-10-CM

## 2014-11-01 DIAGNOSIS — G894 Chronic pain syndrome: Secondary | ICD-10-CM

## 2014-11-01 DIAGNOSIS — K279 Peptic ulcer, site unspecified, unspecified as acute or chronic, without hemorrhage or perforation: Secondary | ICD-10-CM

## 2014-11-01 DIAGNOSIS — F4321 Adjustment disorder with depressed mood: Secondary | ICD-10-CM

## 2014-11-01 DIAGNOSIS — I10 Essential (primary) hypertension: Secondary | ICD-10-CM

## 2014-11-01 LAB — BASIC METABOLIC PANEL
BUN: 16 mg/dL (ref 6–23)
CO2: 26 meq/L (ref 19–32)
Calcium: 9.2 mg/dL (ref 8.4–10.5)
Chloride: 105 mEq/L (ref 96–112)
Creatinine, Ser: 0.9 mg/dL (ref 0.4–1.2)
GFR: 62.01 mL/min (ref >60.00)
GLUCOSE: 89 mg/dL (ref 70–99)
POTASSIUM: 4.3 meq/L (ref 3.5–5.1)
Sodium: 139 mEq/L (ref 135–145)

## 2014-11-01 MED ORDER — OXYCODONE-ACETAMINOPHEN 10-325 MG PO TABS: 1.0000 | ORAL_TABLET | Freq: Four times a day (QID) | ORAL | Status: DC | PRN

## 2014-11-01 NOTE — Progress Notes (Signed)
   Subjective:    Patient ID: [name] Herman, female    DOB: 07/16/1935, 79 y.o.   MRN: [id number]  HPI Patient here for medical follow-up. Her husband died a year ago of sudden cardiac death. She has very supportive family and overall scoping fairly well. She does have history of depression but has been stable on Zoloft.  She has hypertension, remote history of peptic ulcer disease, history of kidney stones, asthma.  Medications reviewed and compliant with all. She remains on Dyazide for hypertension. Blood pressure controlled. No dizziness. No chest pains. No dyspnea. She does have some occasional cough and has some allergic postnasal drip symptoms often . She remains on Asmanex. No history of Prevnar 13 vaccination. Appetite and weight have been stable. No recent falls. Overall, her mood is stable  Past Medical History  Diagnosis Date  . ANEMIA 09/25/2010  . DEPRESSION 12/02/2009  . ESSENTIAL HYPERTENSION 02/10/2010  . ASTHMA 12/02/2009  . COPD 10/03/2010  . PEPTIC ULCER DISEASE 10/23/2010  . NEPHROLITHIASIS 12/02/2009  . OSTEOARTHRITIS 01/27/2010  . Pyloric stenosis   . Peptic ulcer, unspecified site, unspecified as acute or chronic, without mention of hemorrhage, perforation, or obstruction   . Gastritis    Past Surgical History  Procedure Laterality Date  . Abdominal hysterectomy  1970  . Back surgery  1984  . Kidney surgery  1970    reports that she has never smoked. She has never used smokeless tobacco. She reports that she does not drink alcohol or use illicit drugs. family history includes Prostate cancer in her father. There is no history of Colon cancer. Allergies  Allergen Reactions  . Meloxicam     GI upset  . Meperidine Hcl     GI upset      Review of Systems  Constitutional: Negative for fatigue.  Eyes: Negative for visual disturbance.  Respiratory: Negative for cough, chest tightness, shortness of breath and wheezing.   Cardiovascular: Negative for chest pain,  palpitations and leg swelling.  Endocrine: Negative for polydipsia and polyuria.  Neurological: Negative for dizziness, seizures, syncope, weakness, light-headedness and headaches.  Psychiatric/Behavioral: Negative for dysphoric mood.       Objective:   Physical Exam  Constitutional: She is oriented to person, place, and time. She appears well-developed and well-nourished.  Neck: Neck supple. No thyromegaly present.  Cardiovascular: Normal rate and regular rhythm.   Pulmonary/Chest: Effort normal and breath sounds normal. No respiratory distress. She has no wheezes. She has no rales.  Musculoskeletal: She exhibits no edema.  Neurological: She is alert and oriented to person, place, and time.  Psychiatric: She has a normal mood and affect. Her behavior is normal. Judgment and thought content normal.          Assessment & Plan:  #1 hypertension. Stable. Check basic metabolic panel with chronic thiazide use. #2 remote history of peptic ulcer disease. Symptomatically stable. #3 history of adjustment disorder with depressed mood. Clinically stable. We discussed possible tapering of sertraline but at this point she is reluctant- especially going into the holidays. #4 health maintenance. Prevnar 13 given. She has already had flu vaccine. #5 osteoarthritis.  Pain poorly controlled with OTC meds  For years she has taken just one Oxycodone at night and that helps her get through the night.  No constipation issues.

## 2014-11-01 NOTE — Progress Notes (Signed)
Pre visit review using our clinic review tool, if applicable. No additional management support is needed unless otherwise documented below in the visit note. 

## 2014-11-02 ENCOUNTER — Telehealth: Payer: Self-pay | Admitting: Family Medicine

## 2014-11-02 NOTE — Telephone Encounter (Signed)
emmi mailed  °

## 2015-01-05 ENCOUNTER — Ambulatory Visit (INDEPENDENT_AMBULATORY_CARE_PROVIDER_SITE_OTHER): Payer: Commercial Managed Care - HMO | Admitting: Family Medicine

## 2015-01-05 ENCOUNTER — Encounter: Payer: Self-pay | Admitting: Family Medicine

## 2015-01-05 VITALS — BP 130/70 | HR 78 | Temp 97.4°F | Wt 109.0 lb

## 2015-01-05 DIAGNOSIS — R05 Cough: Secondary | ICD-10-CM | POA: Diagnosis not present

## 2015-01-05 DIAGNOSIS — R059 Cough, unspecified: Secondary | ICD-10-CM

## 2015-01-05 MED ORDER — HYDROCODONE-HOMATROPINE 5-1.5 MG/5ML PO SYRP: 5.0000 mL | ORAL_SOLUTION | Freq: Four times a day (QID) | ORAL | Status: AC | PRN

## 2015-01-05 MED ORDER — MOMETASONE FUROATE 220 MCG/INH IN AEPB: 2.0000 | INHALATION_SPRAY | Freq: Every day | RESPIRATORY_TRACT | Status: AC

## 2015-01-05 NOTE — Patient Instructions (Signed)
Do not take the Oxycodone at the same time as the Hycodan cough syrup (not within 6 hours) Follow up for any fever or increased shortness of breath.

## 2015-01-05 NOTE — Progress Notes (Signed)
   Subjective:    Patient ID: [name] Herman, female    DOB: 02/24/1935, 80 y.o.   MRN: [id number]  HPI Patient seen with 2 history of mostly dry cough. She was down in [place] visiting family and fell on January 25. She struck her right rib cage. She's had some mild soreness since then. No hemoptysis. No fevers or chills. She has not had any dyspnea. Her cough is been severe at times. Not relieved with over-the-counter medication. She is on Asmanex regularly. She is not aware of any obvious wheezing  Past Medical History  Diagnosis Date  . ANEMIA 09/25/2010  . DEPRESSION 12/02/2009  . ESSENTIAL HYPERTENSION 02/10/2010  . ASTHMA 12/02/2009  . COPD 10/03/2010  . PEPTIC ULCER DISEASE 10/23/2010  . NEPHROLITHIASIS 12/02/2009  . OSTEOARTHRITIS 01/27/2010  . Pyloric stenosis   . Peptic ulcer, unspecified site, unspecified as acute or chronic, without mention of hemorrhage, perforation, or obstruction   . Gastritis    Past Surgical History  Procedure Laterality Date  . Abdominal hysterectomy  1970  . Back surgery  1984  . Kidney surgery  1970    reports that she has never smoked. She has never used smokeless tobacco. She reports that she does not drink alcohol or use illicit drugs. family history includes Prostate cancer in her father. There is no history of Colon cancer. Allergies  Allergen Reactions  . Meloxicam     GI upset  . Meperidine Hcl     GI upset     Review of Systems  Constitutional: Negative for fever, chills and unexpected weight change.  HENT: Positive for congestion.   Respiratory: Positive for cough. Negative for shortness of breath and wheezing.   Cardiovascular: Negative for chest pain.       Objective:   Physical Exam  Constitutional: She appears well-developed and well-nourished.  HENT:  Right Ear: External ear normal.  Left Ear: External ear normal.  Mouth/Throat: Oropharynx is clear and moist.  Neck: Neck supple. No JVD present.  Cardiovascular: Normal  rate and regular rhythm.   Pulmonary/Chest:  Symmetric breath sounds with no wheezes noted this time. No rales.  Musculoskeletal: She exhibits no edema.          Assessment & Plan:  Cough probably secondary to viral URI. Hycodan cough syrup 1 teaspoon daily at bedtime for severe coughing. Follow-up promptly for fever or increased shortness of breath

## 2015-01-05 NOTE — Progress Notes (Signed)
Pre visit review using our clinic review tool, if applicable. No additional management support is needed unless otherwise documented below in the visit note. 

## 2015-01-28 ENCOUNTER — Other Ambulatory Visit: Payer: Self-pay | Admitting: Family Medicine

## 2015-01-31 ENCOUNTER — Telehealth: Payer: Self-pay | Admitting: Family Medicine

## 2015-01-31 DIAGNOSIS — G894 Chronic pain syndrome: Secondary | ICD-10-CM

## 2015-01-31 NOTE — Telephone Encounter (Signed)
Last visit 01/05/15 Last refill 11/01/14 #120 0 refill

## 2015-01-31 NOTE — Telephone Encounter (Signed)
Refill OK

## 2015-01-31 NOTE — Telephone Encounter (Signed)
Pt request refill of the following  oxyCODONE-acetaminophen (PERCOCET) 10-325 MG per tablet ° ° °Phamacy: ° °

## 2015-02-01 MED ORDER — OXYCODONE-ACETAMINOPHEN 10-325 MG PO TABS: 1.0000 | ORAL_TABLET | Freq: Four times a day (QID) | ORAL | Status: DC | PRN

## 2015-02-01 NOTE — Telephone Encounter (Signed)
Left message on [facility]. Rx is ready for pick up

## 2015-02-02 ENCOUNTER — Other Ambulatory Visit: Payer: Self-pay | Admitting: Family Medicine

## 2015-02-09 ENCOUNTER — Encounter: Payer: Self-pay | Admitting: Family Medicine

## 2015-02-09 ENCOUNTER — Ambulatory Visit (INDEPENDENT_AMBULATORY_CARE_PROVIDER_SITE_OTHER)
Admission: RE | Admit: 2015-02-09 | Discharge: 2015-02-09 | Disposition: A | Discharge status: Home / Self Care | Payer: Commercial Managed Care - HMO | Source: Ambulatory Visit | Attending: Family Medicine | Admitting: Family Medicine

## 2015-02-09 ENCOUNTER — Ambulatory Visit (INDEPENDENT_AMBULATORY_CARE_PROVIDER_SITE_OTHER): Payer: Commercial Managed Care - HMO | Admitting: Family Medicine

## 2015-02-09 VITALS — BP 132/70 | HR 87 | Temp 98.6°F | Wt 107.0 lb

## 2015-02-09 DIAGNOSIS — R05 Cough: Secondary | ICD-10-CM

## 2015-02-09 DIAGNOSIS — R059 Cough, unspecified: Secondary | ICD-10-CM

## 2015-02-09 MED ORDER — LEVOFLOXACIN 500 MG PO TABS: 500.0000 mg | ORAL_TABLET | Freq: Every day | ORAL | Status: DC

## 2015-02-09 MED ORDER — METHYLPREDNISOLONE ACETATE 80 MG/ML IJ SUSP
80.0000 mg | Freq: Once | INTRAMUSCULAR | Status: AC
  Administered 2015-02-09: 80 mg via INTRAMUSCULAR

## 2015-02-09 NOTE — Progress Notes (Signed)
Pre visit review using our clinic review tool, if applicable. No additional management support is needed unless otherwise documented below in the visit note. 

## 2015-02-09 NOTE — Progress Notes (Signed)
   Subjective:    Patient ID: [name] Herman, female    DOB: 02/08/1935, 80 y.o.   MRN: [id number]  HPI Patient seen with persistent cough now over 6 weeks duration. Started when she was in [place] visiting with friends and relatives. Cough is productive mostly of clear sputum. No hemoptysis. No appetite changes. 2 pound weight loss since last visit. She has increased fatigue. She is coughing frequently at night. Hycodan cough syrup helped briefly. No fevers or chills. No GERD symptoms. No postnasal drip symptoms. She has history of asthma and uses Asmanex regularly. Albuterol inhaler as needed. She thinks she may be having some mild wheezing off and on  Past Medical History  Diagnosis Date  . ANEMIA 09/25/2010  . DEPRESSION 12/02/2009  . ESSENTIAL HYPERTENSION 02/10/2010  . ASTHMA 12/02/2009  . COPD 10/03/2010  . PEPTIC ULCER DISEASE 10/23/2010  . NEPHROLITHIASIS 12/02/2009  . OSTEOARTHRITIS 01/27/2010  . Pyloric stenosis   . Peptic ulcer, unspecified site, unspecified as acute or chronic, without mention of hemorrhage, perforation, or obstruction   . Gastritis    Past Surgical History  Procedure Laterality Date  . Abdominal hysterectomy  1970  . Back surgery  1984  . Kidney surgery  1970    reports that she has never smoked. She has never used smokeless tobacco. She reports that she does not drink alcohol or use illicit drugs. family history includes Prostate cancer in her father. There is no history of Colon cancer. Allergies  Allergen Reactions  . Meloxicam     GI upset  . Meperidine Hcl     GI upset      Review of Systems  Constitutional: Negative for fever, chills, appetite change and unexpected weight change.  HENT: Negative for congestion and postnasal drip.   Respiratory: Positive for cough. Negative for shortness of breath.   Cardiovascular: Negative for chest pain.  Gastrointestinal: Negative for abdominal distention.  Neurological: Negative for dizziness and headaches.         Objective:   Physical Exam  Constitutional: She appears well-developed and well-nourished.  She is coughing frequently during exam but in no respiratory distress.  HENT:  Mouth/Throat: Oropharynx is clear and moist.  Neck: Neck supple.  Cardiovascular: Normal rate and regular rhythm.  Exam reveals no gallop.   Pulmonary/Chest: Effort normal and breath sounds normal. No respiratory distress. She has no wheezes. She has no rales.  Musculoskeletal: She exhibits no edema.  Lymphadenopathy:    She has no cervical adenopathy.          Assessment & Plan:  Persistent cough. She's not had any fevers or other indicators of active infection. Obtain chest x-ray given duration of symptoms. Consider Levaquin 500 milligrams once daily for 10 days. Depo-Medrol 80 mg IM given. Continue Asmanex and as needed albuterol. Consider pulmonary referral if cough not resolving over the next couple of weeks.

## 2015-02-16 ENCOUNTER — Telehealth: Payer: Self-pay | Admitting: Family Medicine

## 2015-02-16 NOTE — Telephone Encounter (Signed)
Pt informed

## 2015-02-16 NOTE — Telephone Encounter (Signed)
Pt called to say that since she has been taking the following med   levofloxacin (LEVAQUIN) 500 MG tablet she has experience joint pain. She is asking if she should continue taking this med or if she can stop taking the medication

## 2015-02-16 NOTE — Telephone Encounter (Signed)
Since she has taken for a full week should be OK to stop.

## 2015-02-21 ENCOUNTER — Telehealth: Payer: Self-pay | Admitting: Family Medicine

## 2015-02-21 NOTE — Telephone Encounter (Signed)
Agree with reassess in office.

## 2015-02-21 NOTE — Telephone Encounter (Signed)
Pt states she was to inform dr if not better. Pt  States she is not better. Did not finish levofloxacin (LEVAQUIN) 500 MG tablet . Instructed to stop med per dr.. Pt states her joints are tight and hard to lift her arm. Even hard to walk sometimes.  She read where the side effects of this med. Pt had no issues w/ her joints until this med.  Also notes state possible pulmonary referral. appt made for first available on wed.

## 2015-02-23 ENCOUNTER — Ambulatory Visit (INDEPENDENT_AMBULATORY_CARE_PROVIDER_SITE_OTHER): Payer: Commercial Managed Care - HMO | Admitting: Family Medicine

## 2015-02-23 ENCOUNTER — Encounter: Payer: Self-pay | Admitting: Family Medicine

## 2015-02-23 VITALS — BP 126/74 | HR 86 | Temp 98.1°F | Wt 103.0 lb

## 2015-02-23 DIAGNOSIS — G4700 Insomnia, unspecified: Secondary | ICD-10-CM

## 2015-02-23 DIAGNOSIS — M609 Myositis, unspecified: Secondary | ICD-10-CM

## 2015-02-23 DIAGNOSIS — M791 Myalgia: Secondary | ICD-10-CM | POA: Diagnosis not present

## 2015-02-23 DIAGNOSIS — R05 Cough: Secondary | ICD-10-CM | POA: Diagnosis not present

## 2015-02-23 DIAGNOSIS — R634 Abnormal weight loss: Secondary | ICD-10-CM

## 2015-02-23 DIAGNOSIS — IMO0001 Reserved for inherently not codable concepts without codable children: Secondary | ICD-10-CM

## 2015-02-23 DIAGNOSIS — R059 Cough, unspecified: Secondary | ICD-10-CM

## 2015-02-23 LAB — SEDIMENTATION RATE: Sed Rate: 106 mm/hr — ABNORMAL HIGH (ref 0–22)

## 2015-02-23 MED ORDER — MIRTAZAPINE 15 MG PO TBDP: 15.0000 mg | ORAL_TABLET | Freq: Every day | ORAL | Status: DC

## 2015-02-23 MED ORDER — PREDNISONE 10 MG PO TABS: ORAL_TABLET | ORAL | Status: DC

## 2015-02-23 NOTE — Progress Notes (Signed)
   Subjective:    Patient ID: [name] Herman, female    DOB: 11/01/1935, 80 y.o.   MRN: [id number]  HPI  patient seen for follow-up regarding recent chronic cough. Refer to prior notes. Chest x-ray revealed no evidence for pneumonia and no acute abnormalities. She was treated with Levaquin. Her cough is improved though not totally resolved.   She is concerned that she may be having some muscle aches related to Levaquin. She does not describe any tendinopathy type symptoms. She is describing some pain especially in her upper back and left neck area as well as bilateral hips. She's tried heat and topical rubs with minimal relief. She denies any signs of joint inflammation such as warmth or erythema. She has some stiffness involving both hips and upper body.  Generally has had poor appetite for several months. Depression is stable on sertraline. She has chronic sleep difficulties. She has lost about 8 pounds to 10 pounds over the past year  Past Medical History  Diagnosis Date  . ANEMIA 09/25/2010  . DEPRESSION 12/02/2009  . ESSENTIAL HYPERTENSION 02/10/2010  . ASTHMA 12/02/2009  . COPD 10/03/2010  . PEPTIC ULCER DISEASE 10/23/2010  . NEPHROLITHIASIS 12/02/2009  . OSTEOARTHRITIS 01/27/2010  . Pyloric stenosis   . Peptic ulcer, unspecified site, unspecified as acute or chronic, without mention of hemorrhage, perforation, or obstruction   . Gastritis    Past Surgical History  Procedure Laterality Date  . Abdominal hysterectomy  1970  . Back surgery  1984  . Kidney surgery  1970    reports that she has never smoked. She has never used smokeless tobacco. She reports that she does not drink alcohol or use illicit drugs. family history includes Prostate cancer in her father. There is no history of Colon cancer. Allergies  Allergen Reactions  . Meloxicam     GI upset  . Meperidine Hcl     GI upset      Review of Systems  Constitutional: Positive for appetite change. Negative for fever and  chills.  Eyes: Negative for visual disturbance.  Respiratory: Negative for cough.   Cardiovascular: Negative for chest pain.  Gastrointestinal: Negative for abdominal pain.  Genitourinary: Negative for dysuria.  Musculoskeletal: Positive for myalgias, back pain, neck pain and neck stiffness.  Neurological: Negative for headaches.  Psychiatric/Behavioral: Positive for sleep disturbance.       Objective:   Physical Exam  Constitutional: She appears well-developed and well-nourished. No distress.  Neck: Neck supple. No thyromegaly present.  Cardiovascular: Normal rate and regular rhythm.   Pulmonary/Chest: Effort normal and breath sounds normal. No respiratory distress. She has no wheezes. She has no rales.  Musculoskeletal: She exhibits no edema.  She has some left trapezius tenderness. Somewhat limited range of motion at with flexion and extension.  Neurological:  Symmetric reflexes upper extremities. Full strength upper extremities  Psychiatric: She has a normal mood and affect. Her behavior is normal.          Assessment & Plan:  #1 cough. Improved. Observe for now #2 myalgias. Doubt related to Levaquin. She is not describing any tendinitis type symptoms. Consider etiologies such as polymyalgia. Check sedimentation rate. Brief prednisone taper-and if elevated sedimentation rate and dramatic response may need a long course of low-dose prednisone #3 patient has history of loss of appetite, chronic insomnia, and depression. Discontinue sertraline and start Remeron 15 mg daily at bedtime. Reassess one month

## 2015-02-23 NOTE — Patient Instructions (Signed)
Stop the Sertraline and start the Remeron one at night.

## 2015-02-23 NOTE — Telephone Encounter (Signed)
Pt aware to keep appt 

## 2015-02-23 NOTE — Progress Notes (Signed)
Pre visit review using our clinic review tool, if applicable. No additional management support is needed unless otherwise documented below in the visit note. 

## 2015-02-28 ENCOUNTER — Encounter: Payer: Self-pay | Admitting: Family Medicine

## 2015-02-28 ENCOUNTER — Ambulatory Visit (INDEPENDENT_AMBULATORY_CARE_PROVIDER_SITE_OTHER): Payer: Commercial Managed Care - HMO | Admitting: Family Medicine

## 2015-02-28 VITALS — BP 128/76 | HR 80 | Temp 98.0°F | Wt 103.0 lb

## 2015-02-28 DIAGNOSIS — IMO0001 Reserved for inherently not codable concepts without codable children: Secondary | ICD-10-CM

## 2015-02-28 DIAGNOSIS — M791 Myalgia: Secondary | ICD-10-CM | POA: Diagnosis not present

## 2015-02-28 DIAGNOSIS — M609 Myositis, unspecified: Secondary | ICD-10-CM

## 2015-02-28 LAB — SEDIMENTATION RATE: SED RATE: 20 mm/h (ref 0–22)

## 2015-02-28 NOTE — Progress Notes (Signed)
Pre visit review using our clinic review tool, if applicable. No additional management support is needed unless otherwise documented below in the visit note. 

## 2015-02-28 NOTE — Progress Notes (Signed)
   Subjective:    Patient ID: [name] FlamingMartha H Polio, female    DOB: 05/24/1935, [age] y.o.   MRN: [id number]  HPI  Patient seen for follow-up regarding myalgias and elevated sedimentation rate 106. Refer to prior note. She recently began having some neck and upper back pains as well as bilateral hip pains. We started prednisone taper and she's had fairly dramatic improvement. She is not 100% improved but she states at least 80% improved. She is on current prednisone taper.  She's not any recent visual changes. No chest pains. No fevers or chills. We've prescribed Remeron as she was having not only some depression issues but insomnia issues and poor appetite. She never got this filled because of cost issues. Her appetite has improved somewhat with the prednisone.  Past Medical History  Diagnosis Date  . ANEMIA 09/25/2010  . DEPRESSION 12/02/2009  . ESSENTIAL HYPERTENSION 02/10/2010  . ASTHMA 12/02/2009  . COPD 10/03/2010  . PEPTIC ULCER DISEASE 10/23/2010  . NEPHROLITHIASIS 12/02/2009  . OSTEOARTHRITIS 01/27/2010  . Pyloric stenosis   . Peptic ulcer, unspecified site, unspecified as acute or chronic, without mention of hemorrhage, perforation, or obstruction   . Gastritis    Past Surgical History  Procedure Laterality Date  . Abdominal hysterectomy  1970  . Back surgery  1984  . Kidney surgery  1970    reports that she has never smoked. She has never used smokeless tobacco. She reports that she does not drink alcohol or use illicit drugs. family history includes Prostate cancer in her father. There is no history of Colon cancer. Allergies  Allergen Reactions  . Meloxicam     GI upset  . Meperidine Hcl     GI upset     Review of Systems  Constitutional: Positive for fatigue. Negative for fever and chills.  Eyes: Negative for visual disturbance.  Respiratory: Negative for cough and shortness of breath.   Cardiovascular: Negative for chest pain.  Gastrointestinal: Negative for abdominal pain.    Neurological: Negative for dizziness and headaches.       Objective:   Physical Exam  Constitutional: She appears well-developed and well-nourished. No distress.  Cardiovascular: Normal rate and regular rhythm.   Pulmonary/Chest: Effort normal and breath sounds normal. No respiratory distress. She has no wheezes. She has no rales.  Musculoskeletal: She exhibits no edema.          Assessment & Plan:  Myalgias. Predominately neck and upper back. With recent sedimentation rate 106 strongly suspect polymyalgia. Repeat sedimentation rate today. If she has recurrent myalgias after tapering down prednisone will start back prednisone 15 mg a day and taper gradually to 10 mg and keep this dosage for several months with eventual plans of tapering

## 2015-02-28 NOTE — Patient Instructions (Signed)
Finish out the prednisone Touch base with if you have any recurrent muscle aches after stopping the prednisone

## 2015-03-03 ENCOUNTER — Telehealth: Payer: Self-pay | Admitting: Family Medicine

## 2015-03-03 NOTE — Telephone Encounter (Signed)
Pt was seen on 4/4 and still having pain. Please advse

## 2015-03-04 MED ORDER — PREDNISONE 10 MG PO TABS
ORAL_TABLET | ORAL | Status: DC
Start: 2015-03-04 — End: 2015-03-25

## 2015-03-04 NOTE — Telephone Encounter (Signed)
Recurrence of pain as she has tapered prednisone highly suggests PMR.  Start back prednisone 15 mg daily (prednisone 10- take one and one half daily) until follow up .  I want to see her in one month and will repeat sed rate than and try to start slow taper back at that time.  Make sure she is taking daily calcium and Vit d supplement.

## 2015-03-04 NOTE — Telephone Encounter (Signed)
Pt informed. Rx sent to pharmacy  

## 2015-03-07 ENCOUNTER — Telehealth: Payer: Self-pay | Admitting: Family Medicine

## 2015-03-07 NOTE — Telephone Encounter (Signed)
Pt said she has some questions about a supplement that she need to take. Would like a call back

## 2015-03-08 NOTE — Telephone Encounter (Signed)
Spoke with patient.

## 2015-03-08 NOTE — Telephone Encounter (Signed)
Left message for patient to return call.

## 2015-03-25 ENCOUNTER — Ambulatory Visit (INDEPENDENT_AMBULATORY_CARE_PROVIDER_SITE_OTHER): Payer: Commercial Managed Care - HMO | Admitting: Family Medicine

## 2015-03-25 ENCOUNTER — Encounter: Payer: Self-pay | Admitting: Family Medicine

## 2015-03-25 VITALS — BP 130/70 | HR 86 | Temp 98.2°F | Wt 106.0 lb

## 2015-03-25 DIAGNOSIS — Z7952 Long term (current) use of systemic steroids: Secondary | ICD-10-CM

## 2015-03-25 DIAGNOSIS — M353 Polymyalgia rheumatica: Secondary | ICD-10-CM

## 2015-03-25 MED ORDER — PREDNISONE 10 MG PO TABS: ORAL_TABLET | ORAL | Status: DC

## 2015-03-25 NOTE — Progress Notes (Signed)
   Subjective:    Patient ID: [name] Herman, female    DOB: 03/04/1935, 80 y.o.   MRN: [id number]  HPI Patient seen for follow-up regarding myalgias. Refer to recent notes. She had had respiratory illness and was placed on Levaquin and she thought she may be having tendinopathy related to Levaquin. However, after further evaluation we discovered should have a lot of back and trunk pain and she had sedimentation rate 106. We placed her on low-dose prednisone and her symptoms almost 100% immediately resolved. After she came off prednisone her symptoms returned after going back on prednisone her symptoms again resolved. We feel fairly certain she has polymyalgia rheumatica. She feels a little bit shaky with prednisone but no other side effects. Her appetite is improved and her weight is up 3 pounds. She did not remain on Remeron as she felt too sleepy with that. Her myalgias and neck pain are dramatically improved. Denies any fever or chills.  We do not have record of any prior bone density studies. She is not currently taking regular calcium or vitamin D. No recent falls.  Past Medical History  Diagnosis Date  . ANEMIA 09/25/2010  . DEPRESSION 12/02/2009  . ESSENTIAL HYPERTENSION 02/10/2010  . ASTHMA 12/02/2009  . COPD 10/03/2010  . PEPTIC ULCER DISEASE 10/23/2010  . NEPHROLITHIASIS 12/02/2009  . OSTEOARTHRITIS 01/27/2010  . Pyloric stenosis   . Peptic ulcer, unspecified site, unspecified as acute or chronic, without mention of hemorrhage, perforation, or obstruction   . Gastritis    Past Surgical History  Procedure Laterality Date  . Abdominal hysterectomy  1970  . Back surgery  1984  . Kidney surgery  1970    reports that she has never smoked. She has never used smokeless tobacco. She reports that she does not drink alcohol or use illicit drugs. family history includes Prostate cancer in her father. There is no history of Colon cancer. Allergies  Allergen Reactions  . Meloxicam     GI upset    . Meperidine Hcl     GI upset      Review of Systems  Constitutional: Negative for fever, chills, appetite change and unexpected weight change.  Respiratory: Negative for cough and shortness of breath.   Cardiovascular: Negative for chest pain.  Gastrointestinal: Negative for nausea, vomiting and abdominal pain.  Endocrine: Negative for polydipsia and polyuria.       Objective:   Physical Exam  Constitutional: She appears well-developed and well-nourished.  Neck: Neck supple. No thyromegaly present.  Cardiovascular: Normal rate and regular rhythm.   Pulmonary/Chest: Effort normal and breath sounds normal. No respiratory distress. She has no wheezes. She has no rales.  Musculoskeletal: She exhibits no edema.          Assessment & Plan:  Polymyalgia rheumatica. Continue prednisone 15 mg daily with refill given. Will plan in one month to follow-up and repeat sedimentation rate and if stable at that point try to taper back to 12.5 mg. We will try over the next couple months to get her taper down to 10 mg and then after 9-12 months of therapy consider further slow taper at 1 mg increments from there  Postmenopausal on prednisone therapy. We've recommended daily calcium 1200 mg and vitamin D 1000 to 2000 international units. Set up bone density scan.

## 2015-03-25 NOTE — Patient Instructions (Signed)
Polymyalgia Rheumatica Polymyalgia rheumatica (also called PMR or polymyalgia) is a rheumatologic (arthritic) condition that causes pain and morning stiffness in your neck, shoulders, and hips. It is an inflammatory condition. In some people, inflammation of certain structures in the shoulder, hips, or other joints can be seen on special testing. It does not cause joint destruction, as occurs in other arthritic conditions. It usually occurs after 78 years of age, and is more common as you age. It can be confused with several other diseases, but it is usually easily treated. People with PMR often have, or can develop, a more severe rheumatologic condition called giant cell arteritis (also called CGA or temporal arteritis).  CAUSES  The exact cause of PMR is not known.   There are genetic factors involved.  Viruses have been suspected in the cause of PMR. This has not been proven. SYMPTOMS   Aching, pain, and morning stiffness your neck, both shoulders, or both hips.  Symptoms usually start slowly and build gradually.  Morning stiffness usually lasts at least 30 minutes.  Swelling and tenderness in other joints of the arms, hands, legs, and feet may occur.  Swelling and inflammation in the wrists can cause nerve inflammation at the wrist (carpal tunnel syndrome).  You may also have low grade fever, fatigue, weakness, decreased appetite and weight loss. DIAGNOSIS   Your caregiver may suspect that you have PMR based on your description of your symptoms and on your exam.  Your caregiver will examine you to be sure you do not have diseases that can be confused with PMR. These diseases include rheumatoid arthritis, fibromyalgia, or thyroid disease.  Your caregiver should check for signs of giant cell arteritis. This can cause serious complications such as blindness.  Lab tests can help confirm that you have PMR and not other diseases, but are sometimes inconclusive.  X-rays cannot show PMR.  However, it can identify other diseases like rheumatoid arthritis. Your caregiver may have you see a specialist in arthritis and inflammatory diseases (rheumatologist). TREATMENT  The goal of treatment is relief of symptoms. Treatment does not shorten the course of the illness or prevent complications. With proper treatment, you usually feel better almost right away.   The initial treatment of PMR is usually a cortisone (steroid) medication. Your caregiver will help determine a starting dose. The dose is gradually reduced every few weeks to months. Treatment usually lasts one to three years.  Other stronger medications are rarely needed. They will only be prescribed if your symptoms do not get better on cortisone medication alone, or if they recur as the dose is reduced.  Cortisone medication can have different side effects. With the doses of cortisone needed for PMR, the side effects can affect bones and joints, blood sugar control in diabetes, and mood changes. Discuss this with your caregiver.  Your caregiver will evaluate you regularly during your treatment. They will do this in order to assess progress and to check for complications of the illness or treatment.  Physical therapy is sometimes useful. This is especially true if your joints are still stiff after other symptoms have improved. HOME CARE INSTRUCTIONS   Follow your caregiver's instructions. Do not change your dose of cortisone medication on your own.  Keep your appointments for follow-up lab tests and caregiver visits. Your lab tests need to be monitored. You must get checked periodically for giant cell arteritis.  Follow your caregiver's guidance regarding physical activity (usually no restrictions are needed) or physical therapy.  Your caregiver  may have instructions to prevent or check for side effects from cortisone medication (including bone density testing or treatment). Follow their instructions carefully. SEEK MEDICAL  CARE IF:   You develop any side effects from treatment. Side effects can include:  Elevated blood pressure.  High blood sugar (or worsening of diabetes, if you are diabetic).  Difficulty fighting off infections.  Weight gain.  Weakness of the bones (osteoporosis).  Your aches, pains, morning stiffness, or other symptoms get worse with time. This is especially true after your dose of cortisone is reduced.  You develop new joint symptoms (pain, swelling, etc.) SEEK IMMEDIATE MEDICAL CARE IF:   You develop a severe headache.  You start vomiting.  You have problems with your vision.  You have an oral temperature above 102 F (38.9 C), not controlled by medicine. Document Released: 12/20/2004 Document Revised: 10/29/2012 Document Reviewed: 04/04/2009 [facility] Patient Information 2015 [place], [place]. This information is not intended to replace advice given to you by your health care provider. Make sure you discuss any questions you have with your health care provider.  Take DAILY calcium 1200 mg and Vit D 1000 IU daily.

## 2015-03-25 NOTE — Progress Notes (Signed)
Pre visit review using our clinic review tool, if applicable. No additional management support is needed unless otherwise documented below in the visit note. 

## 2015-03-27 DIAGNOSIS — M353 Polymyalgia rheumatica: Secondary | ICD-10-CM | POA: Insufficient documentation

## 2015-03-29 ENCOUNTER — Ambulatory Visit (INDEPENDENT_AMBULATORY_CARE_PROVIDER_SITE_OTHER)
Admission: RE | Admit: 2015-03-29 | Discharge: 2015-03-29 | Disposition: A | Discharge status: Home / Self Care | Payer: Commercial Managed Care - HMO | Source: Ambulatory Visit | Attending: Family Medicine | Admitting: Family Medicine

## 2015-03-29 DIAGNOSIS — Z7952 Long term (current) use of systemic steroids: Secondary | ICD-10-CM

## 2015-04-01 ENCOUNTER — Ambulatory Visit (INDEPENDENT_AMBULATORY_CARE_PROVIDER_SITE_OTHER): Payer: Commercial Managed Care - HMO | Admitting: Family Medicine

## 2015-04-01 ENCOUNTER — Encounter: Payer: Self-pay | Admitting: Family Medicine

## 2015-04-01 VITALS — BP 130/80 | HR 90 | Temp 97.8°F | Wt 108.0 lb

## 2015-04-01 DIAGNOSIS — R059 Cough, unspecified: Secondary | ICD-10-CM

## 2015-04-01 DIAGNOSIS — R05 Cough: Secondary | ICD-10-CM | POA: Diagnosis not present

## 2015-04-01 MED ORDER — AZITHROMYCIN 250 MG PO TABS: ORAL_TABLET | ORAL | Status: AC

## 2015-04-01 MED ORDER — HYDROCODONE-HOMATROPINE 5-1.5 MG/5ML PO SYRP: 5.0000 mL | ORAL_SOLUTION | Freq: Four times a day (QID) | ORAL | Status: AC | PRN

## 2015-04-01 NOTE — Progress Notes (Signed)
Pre visit review using our clinic review tool, if applicable. No additional management support is needed unless otherwise documented below in the visit note. 

## 2015-04-01 NOTE — Progress Notes (Signed)
   Subjective:    Patient ID: [name] Herman, female    DOB: 06/30/1935, 80 y.o.   MRN: [id number]  HPI Patient seen with cough which started earlier this week. She's had cough productive of green sputum. She has polymyalgia rheumatica on low-dose prednisone. She is concerned about developing pneumonia. She's had some bilateral sinus pressure and some nasal congestion. No dyspnea at rest. Cough severe at times. Not relieved with over-the-counter medications.  Recent DEXA scan osteoporosis. She's had prior history of ulcer disease and reflux so bisphosphonates orally are not a good option.  Past Medical History  Diagnosis Date  . ANEMIA 09/25/2010  . DEPRESSION 12/02/2009  . ESSENTIAL HYPERTENSION 02/10/2010  . ASTHMA 12/02/2009  . COPD 10/03/2010  . PEPTIC ULCER DISEASE 10/23/2010  . NEPHROLITHIASIS 12/02/2009  . OSTEOARTHRITIS 01/27/2010  . Pyloric stenosis   . Peptic ulcer, unspecified site, unspecified as acute or chronic, without mention of hemorrhage, perforation, or obstruction   . Gastritis    Past Surgical History  Procedure Laterality Date  . Abdominal hysterectomy  1970  . Back surgery  1984  . Kidney surgery  1970    reports that she has never smoked. She has never used smokeless tobacco. She reports that she does not drink alcohol or use illicit drugs. family history includes Prostate cancer in her father. There is no history of Colon cancer. Allergies  Allergen Reactions  . Meloxicam     GI upset  . Meperidine Hcl     GI upset       Review of Systems  Constitutional: Positive for fatigue. Negative for fever and chills.  HENT: Positive for congestion.   Respiratory: Positive for cough.        Objective:   Physical Exam  Constitutional: She appears well-developed and well-nourished.  HENT:  Right Ear: External ear normal.  Left Ear: External ear normal.  Mouth/Throat: Oropharynx is clear and moist.  Neck: Neck supple.  Cardiovascular: Normal rate and regular  rhythm.   Pulmonary/Chest: Effort normal and breath sounds normal. No respiratory distress. She has no wheezes. She has no rales.  Lymphadenopathy:    She has no cervical adenopathy.          Assessment & Plan:  Cough. Suspect acute bronchitis. Given her chronic prednisone use we'll go ahead and cover with Zithromax. Hycodan cough syrup 1 teaspoon daily at bedtime when necessary for severe cough.

## 2015-04-01 NOTE — Patient Instructions (Signed)
Follow up for any fever or increased shortness of breath. 

## 2015-04-22 ENCOUNTER — Ambulatory Visit (INDEPENDENT_AMBULATORY_CARE_PROVIDER_SITE_OTHER): Payer: Commercial Managed Care - HMO | Admitting: Family Medicine

## 2015-04-22 ENCOUNTER — Encounter: Payer: Self-pay | Admitting: Family Medicine

## 2015-04-22 VITALS — BP 130/80 | HR 84 | Temp 98.4°F | Wt 109.0 lb

## 2015-04-22 DIAGNOSIS — G894 Chronic pain syndrome: Secondary | ICD-10-CM

## 2015-04-22 DIAGNOSIS — R251 Tremor, unspecified: Secondary | ICD-10-CM

## 2015-04-22 DIAGNOSIS — M810 Age-related osteoporosis without current pathological fracture: Secondary | ICD-10-CM

## 2015-04-22 DIAGNOSIS — M353 Polymyalgia rheumatica: Secondary | ICD-10-CM

## 2015-04-22 LAB — SEDIMENTATION RATE: Sed Rate: 23 mm/hr — ABNORMAL HIGH (ref 0–22)

## 2015-04-22 MED ORDER — PREDNISONE 5 MG PO TABS: ORAL_TABLET | ORAL | Status: DC

## 2015-04-22 MED ORDER — OXYCODONE-ACETAMINOPHEN 10-325 MG PO TABS: 1.0000 | ORAL_TABLET | Freq: Four times a day (QID) | ORAL | Status: DC | PRN

## 2015-04-22 NOTE — Patient Instructions (Signed)
Go ahead and reduce prednisone to 12.5 mg once daily In one month, if symptoms stable reduce prednisone further to 10 mg once daily.

## 2015-04-22 NOTE — Progress Notes (Signed)
Pre visit review using our clinic review tool, if applicable. No additional management support is needed unless otherwise documented below in the visit note. 

## 2015-04-22 NOTE — Progress Notes (Signed)
   Subjective:    Patient ID: [name] Herman, female    DOB: 01/07/1935, 80 y.o.   MRN: [id number]  HPI    Follow-up polymyalgia rheumatica. Symptoms stable. She has some chronic hip pain which is different from her PMR. She is currently on 15 mg prednisone daily. Her sedimentation rate went from 106 to 20. Her neck and upper back pain has resolved. She does have some tremors of upper extremities which could be related to the prednisone. She does drink a fair amount of caffeine. Otherwise tolerating well. Recent DEXA scan osteoporosis with T score -3.2.   She has significant GI problems including history of peptic ulcer disease and reported gastroparesis and is not a great candidate for oral bisphosphonates for that reason.  Past Medical History  Diagnosis Date  . ANEMIA 09/25/2010  . DEPRESSION 12/02/2009  . ESSENTIAL HYPERTENSION 02/10/2010  . ASTHMA 12/02/2009  . COPD 10/03/2010  . PEPTIC ULCER DISEASE 10/23/2010  . NEPHROLITHIASIS 12/02/2009  . OSTEOARTHRITIS 01/27/2010  . Pyloric stenosis   . Peptic ulcer, unspecified site, unspecified as acute or chronic, without mention of hemorrhage, perforation, or obstruction   . Gastritis    Past Surgical History  Procedure Laterality Date  . Abdominal hysterectomy  1970  . Back surgery  1984  . Kidney surgery  1970    reports that she has never smoked. She has never used smokeless tobacco. She reports that she does not drink alcohol or use illicit drugs. family history includes Prostate cancer in her father. There is no history of Colon cancer. Allergies  Allergen Reactions  . Meloxicam     GI upset  . Meperidine Hcl     GI upset      Review of Systems  Constitutional: Negative for appetite change and unexpected weight change.  Respiratory: Negative for cough.   Cardiovascular: Negative for chest pain.  Genitourinary: Negative for dysuria.  Musculoskeletal: Positive for arthralgias.  Neurological: Positive for tremors. Negative for  dizziness and weakness.  Psychiatric/Behavioral: Negative for confusion.       Objective:   Physical Exam  Constitutional: She is oriented to person, place, and time. She appears well-developed and well-nourished.  Cardiovascular: Normal rate and regular rhythm.   Pulmonary/Chest: Effort normal and breath sounds normal. No respiratory distress. She has no wheezes. She has no rales.  Neurological: She is alert and oriented to person, place, and time.  Upper extremity tremor at rest. Does not extinguish with movement.          Assessment & Plan:  #1 polymyalgia rheumatica. Repeat sedimentation rate. Try tapering prednisone to 12.5 mg daily and we'll plan in one month to taper further to 10 mg. #2 osteoporosis. Continue regular calcium and vitamin D. We considered other options for osteoporosis including Reclast and Prolia and she is reluctant at this point. We'll discuss further at follow-up.  #3 tremor.  Probably related to Prednisone.  Hopefully, will improve with lowering dose.  Avoid caffeine.

## 2015-04-24 DIAGNOSIS — M810 Age-related osteoporosis without current pathological fracture: Secondary | ICD-10-CM | POA: Insufficient documentation

## 2015-05-29 ENCOUNTER — Other Ambulatory Visit: Payer: Self-pay | Admitting: Family Medicine

## 2015-06-07 ENCOUNTER — Other Ambulatory Visit: Payer: Self-pay | Admitting: Family Medicine

## 2015-06-22 ENCOUNTER — Ambulatory Visit (INDEPENDENT_AMBULATORY_CARE_PROVIDER_SITE_OTHER): Payer: Commercial Managed Care - HMO | Admitting: Family Medicine

## 2015-06-22 ENCOUNTER — Encounter: Payer: Self-pay | Admitting: Family Medicine

## 2015-06-22 VITALS — BP 130/80 | HR 80 | Temp 98.0°F | Wt 112.0 lb

## 2015-06-22 DIAGNOSIS — M353 Polymyalgia rheumatica: Secondary | ICD-10-CM | POA: Diagnosis not present

## 2015-06-22 LAB — SEDIMENTATION RATE: Sed Rate: 24 mm/hr — ABNORMAL HIGH (ref 0–22)

## 2015-06-22 MED ORDER — ALBUTEROL SULFATE HFA 108 (90 BASE) MCG/ACT IN AERS: 2.0000 | INHALATION_SPRAY | RESPIRATORY_TRACT | Status: AC | PRN

## 2015-06-22 NOTE — Progress Notes (Signed)
   Subjective:    Patient ID: [name] Herman, female    DOB: 03-22-1935, 80 y.o.   MRN: [id number]  HPI   Polymyalgia rheumatica. Initial sedimentation rate 106. She had dramatic improvement with prednisone. We've been able to taper her down to 10 mg with no breakthrough symptoms. Tolerating without major side effects. She does have osteoporosis and has declined specific medications to treat that. She remains on calcium and vitamin D. She denies any significant upper back or neck pains  Past Medical History  Diagnosis Date  . ANEMIA 09/25/2010  . DEPRESSION 12/02/2009  . ESSENTIAL HYPERTENSION 02/10/2010  . ASTHMA 12/02/2009  . COPD 10/03/2010  . PEPTIC ULCER DISEASE 10/23/2010  . NEPHROLITHIASIS 12/02/2009  . OSTEOARTHRITIS 01/27/2010  . Pyloric stenosis   . Peptic ulcer, unspecified site, unspecified as acute or chronic, without mention of hemorrhage, perforation, or obstruction   . Gastritis    Past Surgical History  Procedure Laterality Date  . Abdominal hysterectomy  1970  . Back surgery  1984  . Kidney surgery  1970    reports that she has never smoked. She has never used smokeless tobacco. She reports that she does not drink alcohol or use illicit drugs. family history includes Prostate cancer in her father. There is no history of Colon cancer. Allergies  Allergen Reactions  . Meloxicam     GI upset  . Meperidine Hcl     GI upset      Review of Systems  Constitutional: Negative for fever and chills.  Respiratory: Positive for cough. Negative for shortness of breath and wheezing.   Musculoskeletal: Negative for myalgias and neck pain.       Objective:   Physical Exam  Constitutional: She appears well-developed and well-nourished.  Neck: Neck supple. No thyromegaly present.  Cardiovascular: Normal rate and regular rhythm.   Pulmonary/Chest: Effort normal and breath sounds normal. No respiratory distress. She has no wheezes. She has no rales.  Musculoskeletal: She  exhibits no edema.          Assessment & Plan:  Polymyalgia rheumatica. Stable currently on prednisone 10 mg daily. Repeat sedimentation rate today. We will taper her slowly at 1 mg increments but probably not start for another few months as she was just diagnosed back in March.

## 2015-06-22 NOTE — Progress Notes (Signed)
Pre visit review using our clinic review tool, if applicable. No additional management support is needed unless otherwise documented below in the visit note. 

## 2015-06-27 ENCOUNTER — Ambulatory Visit (INDEPENDENT_AMBULATORY_CARE_PROVIDER_SITE_OTHER): Payer: Commercial Managed Care - HMO | Admitting: Family Medicine

## 2015-06-27 ENCOUNTER — Encounter: Payer: Self-pay | Admitting: Family Medicine

## 2015-06-27 VITALS — BP 126/76 | HR 94 | Temp 98.0°F | Wt 112.0 lb

## 2015-06-27 DIAGNOSIS — R230 Cyanosis: Secondary | ICD-10-CM | POA: Diagnosis not present

## 2015-06-27 DIAGNOSIS — M79674 Pain in right toe(s): Secondary | ICD-10-CM | POA: Diagnosis not present

## 2015-06-27 NOTE — Progress Notes (Signed)
Pre visit review using our clinic review tool, if applicable. No additional management support is needed unless otherwise documented below in the visit note. 

## 2015-06-27 NOTE — Progress Notes (Signed)
   Subjective:    Patient ID: [name] Herman, female    DOB: 08-07-1935, 80 y.o.   MRN: [id number]  HPI Patient seen with right fourth toe bluish discoloration first noted little over week ago. She's had some mild pain and denies any injury. She does not describe any claudication type pains. She's never had any history of venous or arterial embolization. She has never smoked. Patient thinks her symptoms actually are somewhat improved compared to last week with less darkness involving the toe. She denies any recent chest pains or dyspnea. No dizziness. She has polymyalgia rheumatica and we recently been tapering back her prednisone currently at 10 mg daily.  No exacerbating or alleviating factors.  Past Medical History  Diagnosis Date  . ANEMIA 09/25/2010  . DEPRESSION 12/02/2009  . ESSENTIAL HYPERTENSION 02/10/2010  . ASTHMA 12/02/2009  . COPD 10/03/2010  . PEPTIC ULCER DISEASE 10/23/2010  . NEPHROLITHIASIS 12/02/2009  . OSTEOARTHRITIS 01/27/2010  . Pyloric stenosis   . Peptic ulcer, unspecified site, unspecified as acute or chronic, without mention of hemorrhage, perforation, or obstruction   . Gastritis    Past Surgical History  Procedure Laterality Date  . Abdominal hysterectomy  1970  . Back surgery  1984  . Kidney surgery  1970    reports that she has never smoked. She has never used smokeless tobacco. She reports that she does not drink alcohol or use illicit drugs. family history includes Prostate cancer in her father. There is no history of Colon cancer. Allergies  Allergen Reactions  . Meloxicam     GI upset  . Meperidine Hcl     GI upset      Review of Systems  Constitutional: Negative for fever, chills and fatigue.  Eyes: Negative for visual disturbance.  Respiratory: Negative for cough, chest tightness, shortness of breath and wheezing.   Cardiovascular: Negative for chest pain, palpitations and leg swelling.  Neurological: Negative for dizziness, seizures, syncope,  weakness, light-headedness and headaches.       Objective:   Physical Exam  Constitutional: She appears well-developed and well-nourished. No distress.  Neck: No JVD present.  Cardiovascular: Normal rate and regular rhythm.   Pulmonary/Chest: Effort normal and breath sounds normal. No respiratory distress. She has no wheezes. She has no rales.  Musculoskeletal: She exhibits no edema.  Right fourth toe somewhat purplish/bluish in color and this blanches with pressure. No warmth. Nontender. No involvement of the foot. Both feet are warm to touch. She has faintly palpable dorsalis pedis pulses and posterior tibial pulses bilaterally. No ulcerations. No necrosis          Assessment & Plan:  Purplish and bluish discoloration right fourth toe. 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 She is not describing claudication symptoms. Set up arterial Doppler to further assess though suspect this will be normal. Follow-up immediately for increased pain, temperature changes or other concerns

## 2015-06-27 NOTE — Patient Instructions (Signed)
We will call you regarding arterial dopplers  Follow up sooner for any increased pain, "white" color changes, or progression of purplish discoloration.

## 2015-06-30 ENCOUNTER — Telehealth: Payer: Self-pay | Admitting: Family Medicine

## 2015-06-30 ENCOUNTER — Other Ambulatory Visit: Payer: Self-pay | Admitting: Family Medicine

## 2015-06-30 DIAGNOSIS — R230 Cyanosis: Secondary | ICD-10-CM

## 2015-06-30 NOTE — Telephone Encounter (Signed)
Spoke with Gavin Pound about the order. The ultrasound is not showing up in the orders. Reordered the unltrasound informed Gavin Pound. Per Gavin Pound she is going to fax form over the [employer]. Pt is aware.

## 2015-06-30 NOTE — Telephone Encounter (Signed)
Pt call to say that Dr Caryl Never was suppose to schedule her a vascular appt. Pt would like a call back concerning this.   [phone]

## 2015-07-04 NOTE — Telephone Encounter (Signed)
Patient calling to check status of appt for ultrasound.  Please call her back ASAP at [phone]

## 2015-07-07 ENCOUNTER — Ambulatory Visit
Admission: RE | Admit: 2015-07-07 | Discharge: 2015-07-07 | Disposition: A | Discharge status: Home / Self Care | Payer: Commercial Managed Care - HMO | Source: Ambulatory Visit | Attending: Family Medicine | Admitting: Family Medicine

## 2015-07-07 ENCOUNTER — Other Ambulatory Visit: Payer: Self-pay | Admitting: Family Medicine

## 2015-07-07 DIAGNOSIS — L819 Disorder of pigmentation, unspecified: Secondary | ICD-10-CM

## 2015-07-07 DIAGNOSIS — R230 Cyanosis: Secondary | ICD-10-CM

## 2015-07-08 ENCOUNTER — Other Ambulatory Visit: Payer: Self-pay | Admitting: Family Medicine

## 2015-07-08 ENCOUNTER — Ambulatory Visit: Payer: Commercial Managed Care - HMO | Admitting: Family Medicine

## 2015-07-08 DIAGNOSIS — I999 Unspecified disorder of circulatory system: Secondary | ICD-10-CM

## 2015-07-12 ENCOUNTER — Other Ambulatory Visit: Payer: Self-pay | Admitting: Family Medicine

## 2015-07-12 DIAGNOSIS — I999 Unspecified disorder of circulatory system: Secondary | ICD-10-CM

## 2015-07-14 ENCOUNTER — Other Ambulatory Visit: Payer: Self-pay | Admitting: *Deleted

## 2015-07-14 MED ORDER — PANTOPRAZOLE SODIUM 40 MG PO TBEC: DELAYED_RELEASE_TABLET | ORAL | Status: AC

## 2015-07-14 MED ORDER — PREDNISONE 5 MG PO TABS: ORAL_TABLET | ORAL | Status: DC

## 2015-07-14 MED ORDER — SERTRALINE HCL 100 MG PO TABS: 50.0000 mg | ORAL_TABLET | Freq: Every day | ORAL | Status: AC

## 2015-07-28 ENCOUNTER — Encounter: Payer: Self-pay | Admitting: Family Medicine

## 2015-07-28 ENCOUNTER — Ambulatory Visit (INDEPENDENT_AMBULATORY_CARE_PROVIDER_SITE_OTHER): Payer: Commercial Managed Care - HMO | Admitting: Family Medicine

## 2015-07-28 VITALS — BP 150/80 | HR 90 | Temp 98.4°F | Wt 109.0 lb

## 2015-07-28 DIAGNOSIS — I70209 Unspecified atherosclerosis of native arteries of extremities, unspecified extremity: Secondary | ICD-10-CM | POA: Insufficient documentation

## 2015-07-28 DIAGNOSIS — G894 Chronic pain syndrome: Secondary | ICD-10-CM

## 2015-07-28 MED ORDER — OXYCODONE-ACETAMINOPHEN 10-325 MG PO TABS: 1.0000 | ORAL_TABLET | Freq: Four times a day (QID) | ORAL | Status: AC | PRN

## 2015-07-28 NOTE — Progress Notes (Signed)
   Subjective:    Patient ID: [name] Herman, female    DOB: 01-07-1935, 80 y.o.   MRN: [id number]  HPI    Review of Systems     Objective:   Physical Exam        Assessment & Plan:

## 2015-07-28 NOTE — Patient Instructions (Signed)
Follow up immediately for any cold foot, color changes such as increased white, or increasing pain.

## 2015-07-28 NOTE — Progress Notes (Signed)
Pre visit review using our clinic review tool, if applicable. No additional management support is needed unless otherwise documented below in the visit note. 

## 2015-07-28 NOTE — Progress Notes (Signed)
   Subjective:    Patient ID: [name] Herman, female    DOB: 24-Apr-1935, 80 y.o.   MRN: [id number]  HPI Patient seen today with right second third and fourth toes "turning dark ". She was seen recently with some cyanosis involving the right fourth toe. 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 She was sent for arterial Dopplers and these were significant for right ABI 0.68 and left ABI 1.19. She does not describe any claudication symptoms. She has had some mild burning in involving toes over the past couple of days. She is not noted any hematuria or changes in the feet nor any color changes comparing the right and left foot. She has appointment set up to see interventional radiology next week. She is a nonsmoker. She cannot take aspirin secondary to prior history of gastric and peptic ulcers. No history of diabetes. She has hypertension.  Past Medical History  Diagnosis Date  . ANEMIA 09/25/2010  . DEPRESSION 12/02/2009  . ESSENTIAL HYPERTENSION 02/10/2010  . ASTHMA 12/02/2009  . COPD 10/03/2010  . PEPTIC ULCER DISEASE 10/23/2010  . NEPHROLITHIASIS 12/02/2009  . OSTEOARTHRITIS 01/27/2010  . Pyloric stenosis   . Peptic ulcer, unspecified site, unspecified as acute or chronic, without mention of hemorrhage, perforation, or obstruction   . Gastritis    Past Surgical History  Procedure Laterality Date  . Abdominal hysterectomy  1970  . Back surgery  1984  . Kidney surgery  1970    reports that she has never smoked. She has never used smokeless tobacco. She reports that she does not drink alcohol or use illicit drugs. family history includes Prostate cancer in her father. There is no history of Colon cancer. Allergies  Allergen Reactions  . Meloxicam     GI upset  . Meperidine Hcl     GI upset      Review of Systems  Constitutional: Negative for fatigue.  Eyes: Negative for visual disturbance.  Respiratory: Negative for cough, chest tightness, shortness of  breath and wheezing.   Cardiovascular: Negative for chest pain, palpitations and leg swelling.  Neurological: Negative for dizziness, seizures, syncope, weakness, light-headedness and headaches.       Objective:   Physical Exam  Constitutional: She appears well-developed and well-nourished. No distress.  Cardiovascular: Normal rate and regular rhythm.   Pulmonary/Chest: Effort normal and breath sounds normal. No respiratory distress. She has no wheezes. She has no rales.  Musculoskeletal: She exhibits no edema.  Both feet are relatively warm to touch. She has 2+ dorsalis pedis pulse left foot and faintly palpable right foot. She has some cyanotic discoloration right second third and fourth toes but good capillary refill.  Neurological: She is alert.          Assessment & Plan:  Peripheral vascular disease. Recent ABI 0.68 on the right and 1.19 on the left. She has some cyanotic changes involving the right second, third, and fourth digits. She has good capillary refill in both feet are relatively warm to touch. She does not have any clinical evidence to suggest any acute limb threatening ischemia. She does not describe any active claudication-type symptoms with walking. She has scheduled follow-up with interventional radiology next week. We have asked that she follow-up immediately for any temperature changes in the foot, increased pain, or any color changes in the foot

## 2015-08-02 ENCOUNTER — Ambulatory Visit
Admission: RE | Admit: 2015-08-02 | Discharge: 2015-08-02 | Disposition: A | Discharge status: Home / Self Care | Payer: Commercial Managed Care - HMO | Source: Ambulatory Visit | Attending: Family Medicine | Admitting: Family Medicine

## 2015-08-02 ENCOUNTER — Other Ambulatory Visit (HOSPITAL_COMMUNITY): Payer: Self-pay | Admitting: Interventional Radiology

## 2015-08-02 ENCOUNTER — Other Ambulatory Visit: Payer: Self-pay | Admitting: Radiology

## 2015-08-02 DIAGNOSIS — I999 Unspecified disorder of circulatory system: Secondary | ICD-10-CM

## 2015-08-02 DIAGNOSIS — I7090 Unspecified atherosclerosis: Secondary | ICD-10-CM

## 2015-08-02 LAB — BASIC METABOLIC PANEL
BUN: 17 mg/dL (ref 7–25)
CHLORIDE: 97 mmol/L — AB (ref 98–110)
CO2: 32 mmol/L — ABNORMAL HIGH (ref 20–31)
Calcium: 9.7 mg/dL (ref 8.6–10.4)
Creat: 1.07 mg/dL — ABNORMAL HIGH (ref 0.60–0.93)
GLUCOSE: 94 mg/dL (ref 65–99)
POTASSIUM: 3.6 mmol/L (ref 3.5–5.3)
Sodium: 142 mmol/L (ref 135–146)

## 2015-08-02 NOTE — Consult Note (Signed)
Chief Complaint: Patient was seen in consultation today for  Chief Complaint  Patient presents with  . Advice Only    Consult for Evaluation of Right Fourth Toe     at the request of Burchette,Bruce W  Referring Physician(s): Burchette,Bruce W  History of Present Illness: [name] Herman is a 80 y.o. female with a recent history of the development of a bluish discoloration in her right second, third, and fourth toes. This is also occurring, to a lesser degree, in her fifth toe. The right great toe is unaffected. She describes stinging pain in the affected toes which she finds uncomfortable. She denies any skin breakdown. She also denies any previous history of claudication. She denies a prior history of stroke or angina. She has no prior history in her medical records regarding these disorders. She has not on antiplatelets therapy or even aspirin. She does have a history of bleeding peptic ulcer disease. She denies a prior history of atrial fibrillation or arrhythmia. She has never smoked. She has no recollection of hypercholesterolemia. She was recently diagnosed with polymyalgia rheumatica. Her hands and left foot are unaffected at this time. Her only symptom was related to headaches. She has had a noninvasive arterial vascular study which demonstrated markedly diminished ABI on the right at 0.68.  Past Medical History  Diagnosis Date  . ANEMIA 09/25/2010  . DEPRESSION 12/02/2009  . ESSENTIAL HYPERTENSION 02/10/2010  . ASTHMA 12/02/2009  . COPD 10/03/2010  . PEPTIC ULCER DISEASE 10/23/2010  . NEPHROLITHIASIS 12/02/2009  . OSTEOARTHRITIS 01/27/2010  . Pyloric stenosis   . Peptic ulcer, unspecified site, unspecified as acute or chronic, without mention of hemorrhage, perforation, or obstruction   . Gastritis     Past Surgical History  Procedure Laterality Date  . Abdominal hysterectomy  1970  . Back surgery  1984  . Kidney surgery  1970    Allergies: Meloxicam and Meperidine  hcl  Medications: Prior to Admission medications   Medication Sig Start Date End Date Taking? Authorizing Provider  albuterol (PROVENTIL HFA;VENTOLIN HFA) 108 (90 BASE) MCG/ACT inhaler Inhale 2 puffs into the lungs every 4 (four) hours as needed for wheezing. 06/22/15 04/18/17 Yes Kristian Covey, MD  Biotin 1000 MCG tablet Take 1,000 mcg by mouth daily.   Yes Historical Provider, MD  Calcium Carb-Cholecalciferol (CALCIUM 1000 + D PO) Take by mouth.   Yes Historical Provider, MD  latanoprost (XALATAN) 0.005 % ophthalmic solution Place 1 drop into the right eye at bedtime.     Yes Historical Provider, MD  mometasone [facility]) 220 MCG/INH inhaler Inhale 2 puffs into the lungs daily. 01/05/15  Yes Kristian Covey, MD  oxyCODONE-acetaminophen (PERCOCET) 10-325 MG per tablet Take 1 tablet by mouth every 6 (six) hours as needed for pain. 07/28/15 07/27/16 Yes Kristian Covey, MD  pantoprazole (PROTONIX) 40 MG tablet TAKE 1 TABLET (40 MG TOTAL) BY MOUTH DAILY. 07/14/15  Yes Kristian Covey, MD  predniSONE (DELTASONE) 5 MG tablet Take two and one half tablets once daily 07/14/15  Yes Kristian Covey, MD  sertraline (ZOLOFT) 100 MG tablet Take 0.5 tablets (50 mg total) by mouth daily. 07/14/15  Yes Kristian Covey, MD  triamterene-hydrochlorothiazide (DYAZIDE) 37.5-25 MG per capsule TAKE 1 EACH (1 CAPSULE TOTAL) BY MOUTH EVERY MORNING. 08/19/14  Yes Kristian Covey, MD  Ascorbic Acid (VITAMIN C) 1000 MG tablet Take 1,000 mg by mouth daily.    Historical Provider, MD  cyanocobalamin 500 MCG tablet Take  500 mcg by mouth daily.    Historical Provider, MD  Multiple Vitamins-Minerals (OCUVITE PO) Take by mouth.    Historical Provider, MD     Family History  Problem Relation Age of Onset  . Prostate cancer Father     prostate  . Colon cancer Neg Hx     Social History   Social History  . Marital Status: Married    Spouse Name: N/A  . Number of Children: 3  . Years of Education: N/A    Occupational History  . retired    Social History Main Topics  . Smoking status: Never Smoker   . Smokeless tobacco: Never Used  . Alcohol Use: No  . Drug Use: No  . Sexual Activity: Not on file   Other Topics Concern  . Not on file   Social History Narrative    Review of Systems: A 12 point ROS discussed and pertinent positives are indicated in the HPI above.  All other systems are negative.  Review of Systems  Vital Signs: BP 154/85 mmHg  Pulse 101  Temp(Src) 98.4 F (36.9 C) (Oral)  Resp 14  Ht 5' (1.524 m)  Wt 107 lb (48.535 kg)  BMI 20.90 kg/m2  SpO2 96%  Physical Exam  Constitutional: She is oriented to person, place, and time. She appears well-developed and well-nourished.  HENT:  Head: Normocephalic and atraumatic.  Neck: Normal range of motion.  Cardiovascular: Normal rate, regular rhythm and normal heart sounds.   Pulmonary/Chest: Effort normal and breath sounds normal.  Musculoskeletal: She exhibits tenderness. She exhibits no edema.  RLE-Fem pulse 2+. DP/PT pulses dopplerable but nonpalpable. No ulceration. No mottling. Capillary refill is normal. 2nd-4th toes are bluish. Fifth toe is erythematous.  LLE- DP/PT pulses 2+. Normal appearing.  Neurological: She is alert and oriented to person, place, and time.  Skin: Skin is warm and dry.    Imaging: US Arterial Seg Multiple  07/07/2015   CLINICAL DATA:  80 year old female with a history of possible blue toes.  Cardiovascular risk factors include hypertension.  History of black spot on the toe.  EXAM: NONINVASIVE PHYSIOLOGIC VASCULAR STUDY OF BILATERAL LOWER EXTREMITIES  TECHNIQUE: Evaluation of both lower extremities was performed at rest, including calculation of ankle-brachial indices, multiple segmental pressure evaluation, segmental Doppler and segmental pulse volume recording.  COMPARISON:  None.  FINDINGS: Right:  Resting ankle brachial index:  0.68  Segmental blood pressure: Segmental blood pressure  demonstrate symmetric brachial pressures from left to right. Decreased thigh pressure compared to the right brachial pressure. Significant drop in the low thigh pressure from the high thigh.  Doppler: Segmental Doppler of the right lower extremity demonstrates monophasic waveform from the common femoral to the ankle.  Pulse volume recording: Segmental pulse volume recording demonstrates abnormal waveform of the high thigh with loss of augmentation at the below knee. Further deterioration at the ankle and first great toe.  Digital waveforms are maintained in the first and second digit, with near flat line of the third digit, flat line in the fourth digit, and abnormal waveform in the fifth digit.  Left:  Resting ankle brachial index: 1.19  Segmental blood pressure: Segmental blood pressure demonstrate symmetric brachial pressure from left to right. Appropriate increased thigh pressure on the left. No significant drop from segment a segment  Doppler: Segmental Doppler of the left lower extremity demonstrates triphasic waveform of the common femoral artery, popliteal artery, and the ankle arteries.  Pulse volume recording: Segmental pulse volume recording of  the left lower extremity demonstrates waveforms maintained in the thigh, with augmentation maintained from high thigh to below knee. Ankle waveforms maintained.  Segmental waveforms of the digits maintained.  Additional:  IMPRESSION: Right:  Resting ankle-brachial index compatible with moderate arterial occlusive disease. This value would likely decrease after exercise.  Remainder of the study demonstrates evidence of inflow, femoral popliteal, and tibial disease.  Left:  Resting ankle-brachial index suggests no evidence of significant arterial occlusive disease.  Signed,  Yvone Neu. Loreta Ave, DO  [facility]  [facility]   Electronically Signed   By: Gilmer Mor D.O.   On: 07/07/2015 17:04    Labs:  CBC: No  results for input(s): WBC, HGB, HCT, PLT in the last 8760 hours.  COAGS: No results for input(s): INR, APTT in the last 8760 hours.  BMP:  Recent Labs  11/01/14 1147  NA 139  K 4.3  CL 105  CO2 26  GLUCOSE 89  BUN 16  CALCIUM 9.2  CREATININE 0.9    Assessment and Plan:  Ms. Dressel has had the recent onset of painful and bluish toes in her right foot. There is no other evidence of embolic disease elsewhere in her body. She also has no history of cardiac disease or stroke. A noninvasive vascular study indicates significant arterial occlusive disease in the right lower extremity and therefore these findings are likely related to arterial insufficiency. This is potentially treatable either with surgical bypass or endovascular therapy. CT angiogram of the aorta and bilateral lower extremities will be performed to further evaluate her anatomy. If her arterial system is minimally disease, these findings may be related to embolic phenomenon either due to aortic plaque or possibly emboli from the heart. Further CT imaging and echocardiography can be performed at a later time. Lastly, vasculitis related to polymyalgia rheumatica is also a possibility but her symptoms are unifocal, and therefore this is less likely. It would be preferable to place her on antiplatelets therapy such as aspirin. This would need to be measured against her history of peptic ulcer disease and severity of arterial occlusive disease.  Thank you for this interesting consult.  I greatly enjoyed meeting [name] Herman and look forward to participating in their care.  A copy of this report was sent to the requesting provider on this date.  Signed: Abhijay Morriss, ART A 08/02/2015, 1:56 PM   I spent a total of 40 Minutes   in face to face in clinical consultation, greater than 50% of which was counseling/coordinating care for blue toes.

## 2015-08-11 ENCOUNTER — Ambulatory Visit (HOSPITAL_COMMUNITY): Admission: RE | Admit: 2015-08-11 | Payer: Commercial Managed Care - HMO | Source: Ambulatory Visit

## 2015-08-11 ENCOUNTER — Encounter (HOSPITAL_COMMUNITY): Payer: Self-pay

## 2015-08-11 ENCOUNTER — Ambulatory Visit (HOSPITAL_COMMUNITY)
Admission: RE | Admit: 2015-08-11 | Discharge: 2015-08-11 | Disposition: A | Discharge status: Home / Self Care | Payer: Commercial Managed Care - HMO | Source: Ambulatory Visit | Attending: Interventional Radiology | Admitting: Interventional Radiology

## 2015-08-11 DIAGNOSIS — Z96641 Presence of right artificial hip joint: Secondary | ICD-10-CM | POA: Insufficient documentation

## 2015-08-11 DIAGNOSIS — I772 Rupture of artery: Secondary | ICD-10-CM | POA: Insufficient documentation

## 2015-08-11 DIAGNOSIS — K769 Liver disease, unspecified: Secondary | ICD-10-CM | POA: Diagnosis not present

## 2015-08-11 DIAGNOSIS — K5730 Diverticulosis of large intestine without perforation or abscess without bleeding: Secondary | ICD-10-CM | POA: Insufficient documentation

## 2015-08-11 DIAGNOSIS — I999 Unspecified disorder of circulatory system: Secondary | ICD-10-CM | POA: Diagnosis present

## 2015-08-11 DIAGNOSIS — I771 Stricture of artery: Secondary | ICD-10-CM | POA: Diagnosis not present

## 2015-08-11 DIAGNOSIS — I700 Atherosclerosis of aorta: Secondary | ICD-10-CM | POA: Insufficient documentation

## 2015-08-11 MED ORDER — IOHEXOL 350 MG/ML SOLN
100.0000 mL | Freq: Once | INTRAVENOUS | Status: AC | PRN
  Administered 2015-08-11: 100 mL via INTRAVENOUS

## 2015-08-12 ENCOUNTER — Other Ambulatory Visit (HOSPITAL_COMMUNITY): Payer: Self-pay | Admitting: Interventional Radiology

## 2015-08-12 DIAGNOSIS — I999 Unspecified disorder of circulatory system: Secondary | ICD-10-CM

## 2015-08-18 ENCOUNTER — Encounter (INDEPENDENT_AMBULATORY_CARE_PROVIDER_SITE_OTHER): Payer: Self-pay

## 2015-08-18 ENCOUNTER — Other Ambulatory Visit: Payer: Commercial Managed Care - HMO

## 2015-08-18 ENCOUNTER — Ambulatory Visit
Admission: RE | Admit: 2015-08-18 | Discharge: 2015-08-18 | Disposition: A | Discharge status: Home / Self Care | Payer: Commercial Managed Care - HMO | Source: Ambulatory Visit | Attending: Interventional Radiology | Admitting: Interventional Radiology

## 2015-08-18 DIAGNOSIS — I999 Unspecified disorder of circulatory system: Secondary | ICD-10-CM

## 2015-08-18 NOTE — Progress Notes (Signed)
Chief Complaint: "I have blue toes."   Referring Physician(s): Dr. Caryl Never, Dr. Bonnielee Haff  History of Present Illness: [name] Herman is a 80 y.o. female presenting as a scheduled follow up to the clinic today after a recent CTA of her abdomen/pelvis and bilateral lower extremity run-off.  She has been referred by Dr. 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    She states that she went to her primary doctor, Dr. Caryl Never, to be evaluated for new-onset discoloration of her right 4th toe, and subsequent she has had involvement of her right 2nd and 3rd toes with similar appearance.  At first, she reports that the bottom of the 4th toe had a small open sore, but has since developed a scab.    She denies any associated symptoms of her left leg, upper extremities, and denies any stroke symptoms or vision loss.  She reports a "stinging" pain of the involved toes.  She denies any short distance claudication, rest pain symptoms, and symptoms of any prior ulcers.  She is able to successfully and reliably complete all of her tasks day-to-day.    She is a non-smoker, and is not treated for diabetes.  She is treated for HTN.  Thus, her cardiovascular risk factors are age and HTN.      Past Medical History  Diagnosis Date  . ANEMIA 09/25/2010  . DEPRESSION 12/02/2009  . ESSENTIAL HYPERTENSION 02/10/2010  . ASTHMA 12/02/2009  . COPD 10/03/2010  . PEPTIC ULCER DISEASE 10/23/2010  . NEPHROLITHIASIS 12/02/2009  . OSTEOARTHRITIS 01/27/2010  . Pyloric stenosis   . Peptic ulcer, unspecified site, unspecified as acute or chronic, without mention of hemorrhage, perforation, or obstruction   . Gastritis     Past Surgical History  Procedure Laterality Date  . Abdominal hysterectomy  1970  . Back surgery  1984  . Kidney surgery  1970    Allergies: Meloxicam and Meperidine hcl  Medications: Prior to Admission medications   Medication Sig Start Date End Date Taking? Authorizing  Surya Schroeter  aspirin 325 MG EC tablet Take 325 mg by mouth daily.   Yes Historical Egor Fullilove, MD  albuterol (PROVENTIL HFA;VENTOLIN HFA) 108 (90 BASE) MCG/ACT inhaler Inhale 2 puffs into the lungs every 4 (four) hours as needed for wheezing. 06/22/15 04/18/17  Kristian Covey, MD  Ascorbic Acid (VITAMIN C) 1000 MG tablet Take 1,000 mg by mouth daily.    Historical Matthewjames Petrasek, MD  Biotin 1000 MCG tablet Take 1,000 mcg by mouth daily.    Historical Shatonia Hoots, MD  Calcium Carb-Cholecalciferol (CALCIUM 1000 + D PO) Take by mouth.    Historical Favor Kreh, MD  cyanocobalamin 500 MCG tablet Take 500 mcg by mouth daily.    Historical Shamar Engelmann, MD  latanoprost (XALATAN) 0.005 % ophthalmic solution Place 1 drop into the right eye at bedtime.      Historical Serge Main, MD  mometasone [facility]) 220 MCG/INH inhaler Inhale 2 puffs into the lungs daily. 01/05/15   Kristian Covey, MD  Multiple Vitamins-Minerals (OCUVITE PO) Take by mouth.    Historical Lakima Dona, MD  oxyCODONE-acetaminophen (PERCOCET) 10-325 MG per tablet Take 1 tablet by mouth every 6 (six) hours as needed for pain. 07/28/15 07/27/16  Kristian Covey, MD  pantoprazole (PROTONIX) 40 MG tablet TAKE 1 TABLET (40 MG TOTAL) BY MOUTH DAILY. 07/14/15   Kristian Covey, MD  predniSONE (DELTASONE) 5 MG tablet Take two and one half tablets once daily 07/14/15   Kristian Covey, MD  sertraline (ZOLOFT)  100 MG tablet Take 0.5 tablets (50 mg total) by mouth daily. 07/14/15   Kristian Covey, MD  triamterene-hydrochlorothiazide (DYAZIDE) 37.5-25 MG per capsule TAKE 1 EACH (1 CAPSULE TOTAL) BY MOUTH EVERY MORNING. 08/19/14   Kristian Covey, MD     Family History  Problem Relation Age of Onset  . Prostate cancer Father     prostate  . Colon cancer Neg Hx     Social History   Social History  . Marital Status: Married    Spouse Name: N/A  . Number of Children: 3  . Years of Education: N/A   Occupational History  . retired    Social History Main Topics    . Smoking status: Never Smoker   . Smokeless tobacco: Never Used  . Alcohol Use: No  . Drug Use: No  . Sexual Activity: Not on file   Other Topics Concern  . Not on file   Social History Narrative    Review of Systems: A 12 point ROS discussed and pertinent positives are indicated in the HPI above.  All other systems are negative.  Review of Systems  Vital Signs: BP 153/81 mmHg  Pulse 83  Temp(Src) 98.4 F (36.9 C) (Oral)  Resp 14  Ht 5' (1.524 m)  Wt 107 lb (48.535 kg)  BMI 20.90 kg/m2  SpO2 97%  Physical Exam   Targeted physical exam shows bluish discoloration of the right 2nd, 3rd, 4th toes.  There is a small scab on the plantar aspect of the 4th toe, and there is an intact blister on the plantar aspect of the 2nd toe.    She has intact PT and DP pulses of the bilateral lower extremity.  No other ulcers.     Imaging: Ct Angio Ao+bifem W/cm &/or Wo/cm  08/11/2015   CLINICAL DATA:  Right blue toes  EXAM: CT ANGIOGRAPHY OF ABDOMINAL AORTA WITH ILIOFEMORAL RUNOFF  TECHNIQUE: Multidetector CT imaging of the abdomen, pelvis and lower extremities was performed using the standard protocol during bolus administration of intravenous contrast. Multiplanar CT image reconstructions and MIPs were obtained to evaluate the vascular anatomy.  CONTRAST:  OMNIPAQUE IOHEXOL 350 MG/ML SOLN  COMPARISON:  09/29/2010  FINDINGS: The aorta is non aneurysmal and patent. There are diffuse atherosclerotic calcifications with minimal predominately smooth plaque. There is a small penetrating ulcer to the right of midline on image 73 of series 4 below the renal artery takeoff.  Celiac is patent.  Branch vessels are patent.  SMA is patent.  Branch vessels are patent.  IMA is diminutive and patent.  Branch vessels are grossly patent.  Single renal arteries are patent.  There is severe long segment disease involving the right common iliac artery. Narrowing approach is 80-90%. This is secondary to  markedly irregular plaque. This could certainly be a source of emboli. The origin of the common iliac artery is patent. The origin of the internal iliac artery is occluded. The external iliac artery is patent and diminutive.  Left common, internal, and external iliac arteries are patent.  Right lower extremity runoff demonstrates patency of the right common femoral, superficial femoral, and profunda femoral arteries. The popliteal artery is patent. There is grossly 3 vessel runoff to the ankle.  The left common femoral, superficial femoral, and profunda femoral arteries are patent. The popliteal artery is patent. There is grossly 3 vessel runoff to the left ankle.  Low-density lesions in the liver are stable. This supports benign etiology such as cysts.  Spleen, pancreas, kidneys, adrenal glands, and gallbladder are within normal limits  Normal appendix. Bladder is decompressed. Uterus is not clearly visualized and may be resected. Sigmoid diverticulosis without acute diverticulitis.  No free-fluid.  No abnormal retroperitoneal adenopathy.  Multilevel degenerative disc disease without compression deformity. Healed right pubic bone fracture with deformity is noted. Right total hip arthroplasty is in place. No acute fracture.  Review of the MIP images confirms the above findings.  IMPRESSION: There is long segment irregular severe narrowing of the right common iliac artery likely the cause of arterial insufficiency and a source of embolic phenomenon. Stenting and anti-platelet therapy are recommended. This will be discussed with her primary care physician.   Electronically Signed   By: Jolaine Click M.D.   On: 08/11/2015 13:54    Labs:  CBC: No results for input(s): WBC, HGB, HCT, PLT in the last 8760 hours.  COAGS: No results for input(s): INR, APTT in the last 8760 hours.  BMP:  Recent Labs  11/01/14 1147 08/02/15 1427  NA 139 142  K 4.3 3.6  CL 105 97*  CO2 26 32*  GLUCOSE 89 94  BUN 16 17   CALCIUM 9.2 9.7  CREATININE 0.9 1.07*    LIVER FUNCTION TESTS: No results for input(s): BILITOT, AST, ALT, ALKPHOS, PROT, ALBUMIN in the last 8760 hours.  TUMOR MARKERS: No results for input(s): AFPTM, CEA, CA199, CHROMGRNA in the last 8760 hours.  Assessment and Plan:  Ms Hunzeker is a 80 year old female with clinical history of right LE athero-embolic disease ("blue-toe syndrome"), and both non-invasive vascular imaging and CTA demonstrating atherosclerotic changes of the aorta, iliac systems, and bilateral lower extremity.  Right ABI demonstrates moderate-range occlusive disease at rest, although she does not have symptoms of claudication or critical limb ischemia.  Although her CTA shows a stenosis of the right common iliac artery, the embolic disease could have originated from the heart through the right leg.    Her first presenting symptoms are now her symptoms of athero-embolic disease, as she has no prior history symptomatic PAD.  Her cardiovascular risk factors include her age (<65) and HTN.    I had a discussion regarding the pathology/pathophysiology, natural history, risk factors, and management strategies of symptomatic PAD with Ms Wachob and her daughter, including athero-embolic disease.  I discussed medical strategies for minimizing risk factors and stabilizing the disease process, which are to address her risk factor of HTN.  In addition most guidelines for the treatment of symptomatic PAD, including the TASC II guidelines and those endorsed by the American Heart Association would endorse both adding anti-platelet therapy as well as aggressive anti-lipid therapy.  I would prefer her not only on high-dose HMG-Co reductase inhibitors, but also on dual anti-platelet therapy, including  ASA daily and  plavix daily.  I understand she has a history of GI bleeding in 2011, but she now is continued on protonix, and she has a follow up EGD with normalization of prior ulcer.    I  discussed her findings from the arterial non-invasive imaging and CTA, and that a possible target for treatment would be the right iliac artery.  However, treating the narrowing would not necessarily be treating the origin of the embolic disease, and she has no other symptoms of PAD.  I discussed a conservative approach to her therapy at this time, with short term follow up to make sure that her toe changes are not worsening.  She and her daughter would like to  continue this approach with maximal medical therapy, as she does not like the idea of any surgery in the absence of any other symptoms.    I discussed our visit today, imaging findings, and recommendation with her physician, Dr. Caryl Never.   My recommendations are: 1) continue current  ASA therapy.  My recs would be switch to  ASA and also add plavix , if her GI bleeding risk is low.  2) maximize anti-lipid therapy 3) Wear good-fitting shoes, not sandals, to protect her toes.  4) Inspect her toes daily to make sure no worsening occurs, including for signs of infection. 5) We will see her in early October to re-check and make sure that her toes are healing.  6) I suggested that she may benefit from podiatric care given her diagnoses. She may consider this or discuss further with Dr. Caryl Never. 7) We will start a non-invasive arterial imaging strategy for PAD surveillance, with next follow up ABI in 6 months, with the intention of annual if things do not change.   8) She knows that she can call our office at any time to be seen if things change or do not improve.    SignedGilmer Mor 08/18/2015, 1:59 PM   I spent a total of    25 Minutes in face to face in clinical consultation, greater than 50% of which was counseling/coordinating care for symptomatic PAD, athero-embolic disease, medical management.

## 2015-08-22 ENCOUNTER — Telehealth: Payer: Self-pay | Admitting: Family Medicine

## 2015-08-22 DIAGNOSIS — I7090 Unspecified atherosclerosis: Secondary | ICD-10-CM

## 2015-08-22 MED ORDER — CLOPIDOGREL BISULFATE 75 MG PO TABS: 75.0000 mg | ORAL_TABLET | Freq: Every day | ORAL | Status: AC

## 2015-08-22 NOTE — Telephone Encounter (Signed)
Spoke with patient and interventional radiologist. 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 Schedule fasting lipid and hepatic panel for this week. We'll likely start high-dose statin therapy. She will start baby aspirin 81 mg daily and Plavix 75 mg daily. She did have gastric ulcer back in 2011 but no problems since then and she remains on daily Protonix. She is aware of potential risks.

## 2015-08-24 ENCOUNTER — Other Ambulatory Visit (INDEPENDENT_AMBULATORY_CARE_PROVIDER_SITE_OTHER): Payer: Commercial Managed Care - HMO

## 2015-08-24 DIAGNOSIS — E785 Hyperlipidemia, unspecified: Secondary | ICD-10-CM

## 2015-08-24 DIAGNOSIS — I10 Essential (primary) hypertension: Secondary | ICD-10-CM

## 2015-08-24 DIAGNOSIS — I7090 Unspecified atherosclerosis: Secondary | ICD-10-CM

## 2015-08-24 LAB — HEPATIC FUNCTION PANEL
ALBUMIN: 3.7 g/dL (ref 3.5–5.2)
ALK PHOS: 61 U/L (ref 39–117)
ALT: 12 U/L (ref 0–35)
AST: 16 U/L (ref 0–37)
BILIRUBIN DIRECT: 0.1 mg/dL (ref 0.0–0.3)
TOTAL PROTEIN: 6.6 g/dL (ref 6.0–8.3)
Total Bilirubin: 0.3 mg/dL (ref 0.2–1.2)

## 2015-08-24 LAB — LIPID PANEL
CHOL/HDL RATIO: 3
Cholesterol: 225 mg/dL — ABNORMAL HIGH (ref 0–200)
HDL: 84.2 mg/dL (ref >39.00)
LDL Cholesterol: 115 mg/dL — ABNORMAL HIGH (ref 0–99)
NonHDL: 140.78
TRIGLYCERIDES: 128 mg/dL (ref 0.0–149.0)
VLDL: 25.6 mg/dL (ref 0.0–40.0)

## 2015-08-25 MED ORDER — ATORVASTATIN CALCIUM 10 MG PO TABS: 10.0000 mg | ORAL_TABLET | Freq: Every day | ORAL | Status: AC

## 2015-08-28 ENCOUNTER — Other Ambulatory Visit: Payer: Self-pay | Admitting: Family Medicine

## 2015-09-08 ENCOUNTER — Other Ambulatory Visit: Payer: Commercial Managed Care - HMO

## 2015-09-08 ENCOUNTER — Ambulatory Visit
Admission: RE | Admit: 2015-09-08 | Discharge: 2015-09-08 | Disposition: A | Discharge status: Home / Self Care | Payer: Commercial Managed Care - HMO | Source: Ambulatory Visit | Attending: Interventional Radiology | Admitting: Interventional Radiology

## 2015-09-08 ENCOUNTER — Other Ambulatory Visit (HOSPITAL_COMMUNITY): Payer: Self-pay | Admitting: Interventional Radiology

## 2015-09-08 DIAGNOSIS — I999 Unspecified disorder of circulatory system: Secondary | ICD-10-CM

## 2015-09-08 NOTE — Progress Notes (Signed)
Patient ID: [name] Herman, female   DOB: March 04, 1935, 80 y.o.   MRN: [id number]    Chief Complaint: Patient was seen in consultation today for  Chief Complaint  Patient presents with  . Follow-up    RLE    History of Present Illness: [name] Herman is a 80 y.o. female with peripheral vascular disease. She presented with painful blue toes on her right foot. She underwent noninvasive imaging and a CT angiogram demonstrating a right common iliac artery lesion. Her ABI on the right was approximately 0.7. She was initially treated conservatively with aspirin and Plavix. Since then, her pain has continued and interferes with her sleep.  Past Medical History  Diagnosis Date  . ANEMIA 09/25/2010  . DEPRESSION 12/02/2009  . ESSENTIAL HYPERTENSION 02/10/2010  . ASTHMA 12/02/2009  . COPD 10/03/2010  . PEPTIC ULCER DISEASE 10/23/2010  . NEPHROLITHIASIS 12/02/2009  . OSTEOARTHRITIS 01/27/2010  . Pyloric stenosis   . Peptic ulcer, unspecified site, unspecified as acute or chronic, without mention of hemorrhage, perforation, or obstruction   . Gastritis     Past Surgical History  Procedure Laterality Date  . Abdominal hysterectomy  1970  . Back surgery  1984  . Kidney surgery  1970    Allergies: Meloxicam and Meperidine hcl  Medications: Prior to Admission medications   Medication Sig Start Date End Date Taking? Authorizing Provider  albuterol (PROVENTIL HFA;VENTOLIN HFA) 108 (90 BASE) MCG/ACT inhaler Inhale 2 puffs into the lungs every 4 (four) hours as needed for wheezing. 06/22/15 04/18/17 Yes Kristian Covey, MD  Ascorbic Acid (VITAMIN C) 1000 MG tablet Take 1,000 mg by mouth daily.   Yes Historical Provider, MD  aspirin 81 MG tablet Take 81 mg by mouth daily.   Yes Historical Provider, MD  atorvastatin (LIPITOR) 10 MG tablet Take 1 tablet (10 mg total) by mouth daily. 08/25/15  Yes Kristian Covey, MD  Biotin 1000 MCG tablet Take 1,000 mcg by mouth daily.   Yes Historical Provider, MD    Calcium Carb-Cholecalciferol (CALCIUM 1000 + D PO) Take by mouth.   Yes Historical Provider, MD  clopidogrel (PLAVIX) 75 MG tablet Take 1 tablet (75 mg total) by mouth daily. 08/22/15  Yes Kristian Covey, MD  cyanocobalamin 500 MCG tablet Take 500 mcg by mouth daily.   Yes Historical Provider, MD  latanoprost (XALATAN) 0.005 % ophthalmic solution Place 1 drop into the right eye at bedtime.     Yes Historical Provider, MD  mometasone [facility]) 220 MCG/INH inhaler Inhale 2 puffs into the lungs daily. 01/05/15  Yes Kristian Covey, MD  Multiple Vitamins-Minerals (OCUVITE PO) Take by mouth.   Yes Historical Provider, MD  oxyCODONE-acetaminophen (PERCOCET) 10-325 MG per tablet Take 1 tablet by mouth every 6 (six) hours as needed for pain. 07/28/15 07/27/16 Yes Kristian Covey, MD  pantoprazole (PROTONIX) 40 MG tablet TAKE 1 TABLET (40 MG TOTAL) BY MOUTH DAILY. 07/14/15  Yes Kristian Covey, MD  predniSONE (DELTASONE) 5 MG tablet Take two and one half tablets once daily 07/14/15  Yes Kristian Covey, MD  sertraline (ZOLOFT) 100 MG tablet Take 0.5 tablets (50 mg total) by mouth daily. 07/14/15  Yes Kristian Covey, MD  triamterene-hydrochlorothiazide (DYAZIDE) 37.5-25 MG capsule TAKE ONE CAPSULE BY MOUTH EVERY MORNING 08/29/15  Yes Kristian Covey, MD  aspirin 325 MG EC tablet Take 325 mg by mouth daily.    Historical Provider, MD     Family History  Problem Relation Age  of Onset  . Prostate cancer Father     prostate  . Colon cancer Neg Hx     Social History   Social History  . Marital Status: Married    Spouse Name: N/A  . Number of Children: 3  . Years of Education: N/A   Occupational History  . retired    Social History Main Topics  . Smoking status: Never Smoker   . Smokeless tobacco: Never Used  . Alcohol Use: No  . Drug Use: No  . Sexual Activity: Not on file   Other Topics Concern  . Not on file   Social History Narrative     Review of Systems: A 12 point ROS  discussed and pertinent positives are indicated in the HPI above.  All other systems are negative.  Review of Systems  Vital Signs: BP 178/83 mmHg  Pulse 100  Temp(Src) 98.3 F (36.8 C) (Oral)  Resp 14  SpO2 99%  Physical Exam  Musculoskeletal:  Examination of her right foot demonstrates a continued bluish hue to her right second third and fourth toes. Capillary refill is intact. On the plantar surface of her second toe, there is a large hard dark eschar. There is also a wound under the fourth toe which is dry but worrisome for developing dry gangrene. The remainder of the foot is warm and pink.    Mallampati Score:     Imaging: Ct Angio Ao+bifem W/cm &/or Wo/cm  08/11/2015  CLINICAL DATA:  Right blue toes EXAM: CT ANGIOGRAPHY OF ABDOMINAL AORTA WITH ILIOFEMORAL RUNOFF TECHNIQUE: Multidetector CT imaging of the abdomen, pelvis and lower extremities was performed using the standard protocol during bolus administration of intravenous contrast. Multiplanar CT image reconstructions and MIPs were obtained to evaluate the vascular anatomy. CONTRAST:  100mL OMNIPAQUE IOHEXOL 350 MG/ML SOLN COMPARISON:  09/29/2010 FINDINGS: The aorta is non aneurysmal and patent. There are diffuse atherosclerotic calcifications with minimal predominately smooth plaque. There is a small penetrating ulcer to the right of midline on image 73 of series 4 below the renal artery takeoff. Celiac is patent.  Branch vessels are patent. SMA is patent.  Branch vessels are patent. IMA is diminutive and patent.  Branch vessels are grossly patent. Single renal arteries are patent. There is severe long segment disease involving the right common iliac artery. Narrowing approach is 80-90%. This is secondary to markedly irregular plaque. This could certainly be a source of emboli. The origin of the common iliac artery is patent. The origin of the internal iliac artery is occluded. The external iliac artery is patent and diminutive.  Left common, internal, and external iliac arteries are patent. Right lower extremity runoff demonstrates patency of the right common femoral, superficial femoral, and profunda femoral arteries. The popliteal artery is patent. There is grossly 3 vessel runoff to the ankle. The left common femoral, superficial femoral, and profunda femoral arteries are patent. The popliteal artery is patent. There is grossly 3 vessel runoff to the left ankle. Low-density lesions in the liver are stable. This supports benign etiology such as cysts. Spleen, pancreas, kidneys, adrenal glands, and gallbladder are within normal limits Normal appendix. Bladder is decompressed. Uterus is not clearly visualized and may be resected. Sigmoid diverticulosis without acute diverticulitis. No free-fluid.  No abnormal retroperitoneal adenopathy. Multilevel degenerative disc disease without compression deformity. Healed right pubic bone fracture with deformity is noted. Right total hip arthroplasty is in place. No acute fracture. Review of the MIP images confirms the above findings. IMPRESSION: There  is long segment irregular severe narrowing of the right common iliac artery likely the cause of arterial insufficiency and a source of embolic phenomenon. Stenting and anti-platelet therapy are recommended. This will be discussed with her primary care physician. Electronically Signed   By: Jolaine Click M.D.   On: 08/11/2015 13:54    Labs:  CBC: No results for input(s): WBC, HGB, HCT, PLT in the last 8760 hours.  COAGS: No results for input(s): INR, APTT in the last 8760 hours.  BMP:  Recent Labs  11/01/14 1147 08/02/15 1427  NA 139 142  K 4.3 3.6  CL 105 97*  CO2 26 32*  GLUCOSE 89 94  BUN 16 17  CALCIUM 9.2 9.7  CREATININE 0.9 1.07*    LIVER FUNCTION TESTS:  Recent Labs  08/24/15 0818  BILITOT 0.3  AST 16  ALT 12  ALKPHOS 61  PROT 6.6  ALBUMIN 3.7    TUMOR MARKERS: No results for input(s): AFPTM, CEA, CA199,  CHROMGRNA in the last 8760 hours.  Assessment and Plan:  Overall, the condition of [name] Herman toes has worsened. Developing dry gangrene is a concern under fourth toe. We will provide a referral to a wound care center and set her up for a right common iliac artery stent. I described the procedure, risks, and benefits. Her questions were answered and we will proceed.   Signed: Monterius Rolf, ART A 09/08/2015, 2:58 PM   I spent a total of   15 Minutes in face to face in clinical consultation, greater than 50% of which was counseling/coordinating care for peripheral vascular disease.

## 2015-09-13 ENCOUNTER — Other Ambulatory Visit: Payer: Self-pay | Admitting: Family Medicine

## 2015-09-13 ENCOUNTER — Other Ambulatory Visit (HOSPITAL_COMMUNITY): Payer: Self-pay | Admitting: Interventional Radiology

## 2015-09-13 DIAGNOSIS — I739 Peripheral vascular disease, unspecified: Secondary | ICD-10-CM

## 2015-09-22 ENCOUNTER — Ambulatory Visit (INDEPENDENT_AMBULATORY_CARE_PROVIDER_SITE_OTHER): Payer: Commercial Managed Care - HMO | Admitting: Family Medicine

## 2015-09-22 ENCOUNTER — Encounter: Payer: Self-pay | Admitting: Family Medicine

## 2015-09-22 VITALS — BP 140/90 | HR 108 | Temp 98.4°F | Wt 119.9 lb

## 2015-09-22 DIAGNOSIS — Z23 Encounter for immunization: Secondary | ICD-10-CM | POA: Diagnosis not present

## 2015-09-22 DIAGNOSIS — M353 Polymyalgia rheumatica: Secondary | ICD-10-CM | POA: Diagnosis not present

## 2015-09-22 DIAGNOSIS — I70209 Unspecified atherosclerosis of native arteries of extremities, unspecified extremity: Secondary | ICD-10-CM

## 2015-09-22 DIAGNOSIS — E785 Hyperlipidemia, unspecified: Secondary | ICD-10-CM

## 2015-09-22 DIAGNOSIS — I10 Essential (primary) hypertension: Secondary | ICD-10-CM | POA: Diagnosis not present

## 2015-09-22 NOTE — Progress Notes (Signed)
Pre visit review using our clinic review tool, if applicable. No additional management support is needed unless otherwise documented below in the visit note. 

## 2015-09-22 NOTE — Addendum Note (Signed)
Addended by: Griselda MinerJIMENEZ, Sanaai Doane E on: 09/22/2015 02:17 PM   Modules accepted: Orders

## 2015-09-22 NOTE — Progress Notes (Signed)
   Subjective:    Patient ID: [name] Herman, female    DOB: 10/18/1935, 80 y.o.   MRN: [id number]  HPI Follow-up multiple issues  PMR. Currently on prednisone 10 mg daily. Started treatment back in March. Last sedimentation rate 24. Currently neck and upper back and shoulder myalgias are improved and stable.  No diabetes history  Peripheral vascular disease. Impaired circulation right lower extremity. She has some dry gangrene right second toe and minimally fourth toe. Planned interventional [place] procedure November 1. She is also been referred to wound care center on November 4. She is actually had some improvement in some of her toes over the past couple weeks. She is taking Plavix and aspirin as well as Lipitor. She does have some pain in the second and fourth toes but ambulating without difficulty  Hyperlipidemia with recent LDL 115. Tolerating Lipitor without side effects.  Still needs flu vaccine  Past Medical History  Diagnosis Date  . ANEMIA 09/25/2010  . DEPRESSION 12/02/2009  . ESSENTIAL HYPERTENSION 02/10/2010  . ASTHMA 12/02/2009  . COPD 10/03/2010  . PEPTIC ULCER DISEASE 10/23/2010  . NEPHROLITHIASIS 12/02/2009  . OSTEOARTHRITIS 01/27/2010  . Pyloric stenosis   . Peptic ulcer, unspecified site, unspecified as acute or chronic, without mention of hemorrhage, perforation, or obstruction   . Gastritis    Past Surgical History  Procedure Laterality Date  . Abdominal hysterectomy  1970  . Back surgery  1984  . Kidney surgery  1970    reports that she has never smoked. She has never used smokeless tobacco. She reports that she does not drink alcohol or use illicit drugs. family history includes Prostate cancer in her father. There is no history of Colon cancer. Allergies  Allergen Reactions  . Meloxicam     GI upset  . Meperidine Hcl     GI upset        Review of Systems  Constitutional: Negative for fatigue.  Eyes: Negative for visual disturbance.  Respiratory:  Negative for cough, chest tightness, shortness of breath and wheezing.   Cardiovascular: Negative for chest pain, palpitations and leg swelling.  Gastrointestinal: Negative for abdominal pain.  Endocrine: Negative for polydipsia and polyuria.  Neurological: Negative for dizziness, seizures, syncope, weakness, light-headedness and headaches.       Objective:   Physical Exam  Constitutional: She appears well-developed and well-nourished.  Cardiovascular: Normal rate and regular rhythm.   Pulmonary/Chest: Effort normal and breath sounds normal. No respiratory distress. She has no wheezes. She has no rales.  Musculoskeletal: She exhibits no edema.  Skin:  She has some dry gangrene involving the distal aspect of the right second toe and very minimally involving the distal right fourth toe. She has cyanotic changes of the third toe Foot is fairly warm to touch          Assessment & Plan:  #1 PMR. Just started on prednisone in March and currently stable on 10 mg daily. We've elected no further tapering until she gets through procedural interventions above. Reassess in January and if sedimentation rate stable that time start 1 mg incremental decrease slowly #2 hyperlipidemia. Recently initiated Lipitor. Check lipid and hepatic panel in 1 month #3 peripheral vascular disease with impaired flow right lower extremity. Continue Plavix and aspirin. Interventional procedures above. Pending consult with wound care regarding lesions right second and fourth toes #4 health maintenance. Flu vaccine given

## 2015-09-22 NOTE — Patient Instructions (Signed)
Get your lipid and hepatic panel by end of November Continue with the Plavix and the aspirin Keep same dose of prednisone until follow up in January.

## 2015-09-26 ENCOUNTER — Other Ambulatory Visit: Payer: Self-pay | Admitting: Radiology

## 2015-09-27 ENCOUNTER — Inpatient Hospital Stay (HOSPITAL_COMMUNITY): Payer: Commercial Managed Care - HMO | Admitting: Anesthesiology

## 2015-09-27 ENCOUNTER — Inpatient Hospital Stay (HOSPITAL_COMMUNITY): Payer: Commercial Managed Care - HMO | Admitting: Certified Registered Nurse Anesthetist

## 2015-09-27 ENCOUNTER — Inpatient Hospital Stay (HOSPITAL_COMMUNITY)
Admission: RE | Admit: 2015-09-27 | Discharge: 2015-10-27 | DRG: 252 | Disposition: E | Payer: Commercial Managed Care - HMO | Source: Ambulatory Visit | Attending: Pulmonary Disease | Admitting: Pulmonary Disease

## 2015-09-27 ENCOUNTER — Inpatient Hospital Stay (HOSPITAL_COMMUNITY): Payer: Commercial Managed Care - HMO

## 2015-09-27 ENCOUNTER — Ambulatory Visit (HOSPITAL_COMMUNITY)
Admission: RE | Admit: 2015-09-27 | Discharge: 2015-09-27 | Disposition: A | Discharge status: Home / Self Care | Payer: Commercial Managed Care - HMO | Source: Ambulatory Visit | Attending: Interventional Radiology | Admitting: Interventional Radiology

## 2015-09-27 ENCOUNTER — Other Ambulatory Visit (HOSPITAL_COMMUNITY): Payer: Self-pay | Admitting: Interventional Radiology

## 2015-09-27 ENCOUNTER — Encounter (HOSPITAL_COMMUNITY): Admission: RE | Disposition: E | Payer: Self-pay | Source: Ambulatory Visit | Attending: Interventional Radiology

## 2015-09-27 DIAGNOSIS — Z4659 Encounter for fitting and adjustment of other gastrointestinal appliance and device: Secondary | ICD-10-CM

## 2015-09-27 DIAGNOSIS — J9600 Acute respiratory failure, unspecified whether with hypoxia or hypercapnia: Secondary | ICD-10-CM

## 2015-09-27 DIAGNOSIS — J449 Chronic obstructive pulmonary disease, unspecified: Secondary | ICD-10-CM | POA: Diagnosis present

## 2015-09-27 DIAGNOSIS — R402234 Coma scale, best verbal response, inappropriate words, 24 hours or more after hospital admission: Secondary | ICD-10-CM | POA: Diagnosis not present

## 2015-09-27 DIAGNOSIS — D62 Acute posthemorrhagic anemia: Secondary | ICD-10-CM | POA: Diagnosis not present

## 2015-09-27 DIAGNOSIS — S35511A Injury of right iliac artery, initial encounter: Secondary | ICD-10-CM | POA: Diagnosis not present

## 2015-09-27 DIAGNOSIS — E870 Hyperosmolality and hypernatremia: Secondary | ICD-10-CM | POA: Diagnosis present

## 2015-09-27 DIAGNOSIS — R402134 Coma scale, eyes open, to sound, 24 hours or more after hospital admission: Secondary | ICD-10-CM | POA: Diagnosis not present

## 2015-09-27 DIAGNOSIS — D65 Disseminated intravascular coagulation [defibrination syndrome]: Secondary | ICD-10-CM | POA: Diagnosis not present

## 2015-09-27 DIAGNOSIS — N189 Chronic kidney disease, unspecified: Secondary | ICD-10-CM | POA: Diagnosis present

## 2015-09-27 DIAGNOSIS — Z515 Encounter for palliative care: Secondary | ICD-10-CM | POA: Diagnosis present

## 2015-09-27 DIAGNOSIS — J9690 Respiratory failure, unspecified, unspecified whether with hypoxia or hypercapnia: Secondary | ICD-10-CM

## 2015-09-27 DIAGNOSIS — J939 Pneumothorax, unspecified: Secondary | ICD-10-CM | POA: Diagnosis not present

## 2015-09-27 DIAGNOSIS — Z66 Do not resuscitate: Secondary | ICD-10-CM | POA: Diagnosis present

## 2015-09-27 DIAGNOSIS — I248 Other forms of acute ischemic heart disease: Secondary | ICD-10-CM | POA: Diagnosis present

## 2015-09-27 DIAGNOSIS — S35513A Injury of unspecified iliac artery, initial encounter: Secondary | ICD-10-CM | POA: Diagnosis present

## 2015-09-27 DIAGNOSIS — I469 Cardiac arrest, cause unspecified: Secondary | ICD-10-CM | POA: Diagnosis present

## 2015-09-27 DIAGNOSIS — J45909 Unspecified asthma, uncomplicated: Secondary | ICD-10-CM | POA: Diagnosis present

## 2015-09-27 DIAGNOSIS — Z789 Other specified health status: Secondary | ICD-10-CM

## 2015-09-27 DIAGNOSIS — Z7952 Long term (current) use of systemic steroids: Secondary | ICD-10-CM

## 2015-09-27 DIAGNOSIS — R6521 Severe sepsis with septic shock: Secondary | ICD-10-CM | POA: Diagnosis not present

## 2015-09-27 DIAGNOSIS — I129 Hypertensive chronic kidney disease with stage 1 through stage 4 chronic kidney disease, or unspecified chronic kidney disease: Secondary | ICD-10-CM | POA: Diagnosis present

## 2015-09-27 DIAGNOSIS — E162 Hypoglycemia, unspecified: Secondary | ICD-10-CM | POA: Diagnosis not present

## 2015-09-27 DIAGNOSIS — M7981 Nontraumatic hematoma of soft tissue: Secondary | ICD-10-CM | POA: Diagnosis not present

## 2015-09-27 DIAGNOSIS — J811 Chronic pulmonary edema: Secondary | ICD-10-CM | POA: Diagnosis present

## 2015-09-27 DIAGNOSIS — E872 Acidosis: Secondary | ICD-10-CM | POA: Diagnosis present

## 2015-09-27 DIAGNOSIS — R402314 Coma scale, best motor response, none, 24 hours or more after hospital admission: Secondary | ICD-10-CM | POA: Diagnosis not present

## 2015-09-27 DIAGNOSIS — R571 Hypovolemic shock: Secondary | ICD-10-CM | POA: Diagnosis present

## 2015-09-27 DIAGNOSIS — K922 Gastrointestinal hemorrhage, unspecified: Secondary | ICD-10-CM | POA: Diagnosis not present

## 2015-09-27 DIAGNOSIS — N141 Nephropathy induced by other drugs, medicaments and biological substances: Secondary | ICD-10-CM | POA: Diagnosis present

## 2015-09-27 DIAGNOSIS — N170 Acute kidney failure with tubular necrosis: Secondary | ICD-10-CM | POA: Diagnosis not present

## 2015-09-27 DIAGNOSIS — E46 Unspecified protein-calorie malnutrition: Secondary | ICD-10-CM

## 2015-09-27 DIAGNOSIS — R58 Hemorrhage, not elsewhere classified: Secondary | ICD-10-CM | POA: Diagnosis not present

## 2015-09-27 DIAGNOSIS — I708 Atherosclerosis of other arteries: Secondary | ICD-10-CM | POA: Diagnosis present

## 2015-09-27 DIAGNOSIS — G9341 Metabolic encephalopathy: Secondary | ICD-10-CM | POA: Diagnosis not present

## 2015-09-27 DIAGNOSIS — I739 Peripheral vascular disease, unspecified: Secondary | ICD-10-CM | POA: Diagnosis not present

## 2015-09-27 DIAGNOSIS — Z452 Encounter for adjustment and management of vascular access device: Secondary | ICD-10-CM | POA: Insufficient documentation

## 2015-09-27 DIAGNOSIS — E876 Hypokalemia: Secondary | ICD-10-CM | POA: Diagnosis not present

## 2015-09-27 DIAGNOSIS — Z7982 Long term (current) use of aspirin: Secondary | ICD-10-CM | POA: Diagnosis not present

## 2015-09-27 DIAGNOSIS — A419 Sepsis, unspecified organism: Secondary | ICD-10-CM | POA: Diagnosis present

## 2015-09-27 DIAGNOSIS — Z992 Dependence on renal dialysis: Secondary | ICD-10-CM

## 2015-09-27 DIAGNOSIS — Z7902 Long term (current) use of antithrombotics/antiplatelets: Secondary | ICD-10-CM | POA: Diagnosis not present

## 2015-09-27 DIAGNOSIS — K661 Hemoperitoneum: Secondary | ICD-10-CM | POA: Diagnosis present

## 2015-09-27 DIAGNOSIS — I70268 Atherosclerosis of native arteries of extremities with gangrene, other extremity: Secondary | ICD-10-CM | POA: Diagnosis present

## 2015-09-27 DIAGNOSIS — T8119XA Other postprocedural shock, initial encounter: Secondary | ICD-10-CM | POA: Diagnosis present

## 2015-09-27 DIAGNOSIS — R4020 Unspecified coma: Secondary | ICD-10-CM | POA: Diagnosis not present

## 2015-09-27 DIAGNOSIS — R579 Shock, unspecified: Secondary | ICD-10-CM

## 2015-09-27 DIAGNOSIS — I959 Hypotension, unspecified: Secondary | ICD-10-CM

## 2015-09-27 DIAGNOSIS — R578 Other shock: Secondary | ICD-10-CM | POA: Diagnosis present

## 2015-09-27 DIAGNOSIS — I97418 Intraoperative hemorrhage and hematoma of a circulatory system organ or structure complicating other circulatory system procedure: Secondary | ICD-10-CM | POA: Diagnosis not present

## 2015-09-27 DIAGNOSIS — N179 Acute kidney failure, unspecified: Secondary | ICD-10-CM | POA: Diagnosis not present

## 2015-09-27 DIAGNOSIS — J9601 Acute respiratory failure with hypoxia: Secondary | ICD-10-CM | POA: Diagnosis not present

## 2015-09-27 DIAGNOSIS — T508X5A Adverse effect of diagnostic agents, initial encounter: Secondary | ICD-10-CM | POA: Diagnosis not present

## 2015-09-27 DIAGNOSIS — J9383 Other pneumothorax: Secondary | ICD-10-CM | POA: Diagnosis not present

## 2015-09-27 DIAGNOSIS — T17990A Other foreign object in respiratory tract, part unspecified in causing asphyxiation, initial encounter: Secondary | ICD-10-CM | POA: Diagnosis not present

## 2015-09-27 DIAGNOSIS — Z978 Presence of other specified devices: Secondary | ICD-10-CM

## 2015-09-27 DIAGNOSIS — M353 Polymyalgia rheumatica: Secondary | ICD-10-CM | POA: Diagnosis present

## 2015-09-27 DIAGNOSIS — Z9689 Presence of other specified functional implants: Secondary | ICD-10-CM

## 2015-09-27 DIAGNOSIS — E875 Hyperkalemia: Secondary | ICD-10-CM | POA: Diagnosis not present

## 2015-09-27 DIAGNOSIS — J189 Pneumonia, unspecified organism: Secondary | ICD-10-CM | POA: Diagnosis not present

## 2015-09-27 DIAGNOSIS — J95811 Postprocedural pneumothorax: Secondary | ICD-10-CM | POA: Diagnosis not present

## 2015-09-27 HISTORY — PX: HEMATOMA EVACUATION: SHX5118

## 2015-09-27 HISTORY — PX: REPAIR ILIAC ARTERY: SHX6216

## 2015-09-27 LAB — DIFFERENTIAL
BAND NEUTROPHILS: 0 %
BASOS PCT: 0 %
BLASTS: 0 %
Basophils Absolute: 0 10*3/uL (ref 0.0–0.1)
EOS PCT: 0 %
Eosinophils Absolute: 0 10*3/uL (ref 0.0–0.7)
LYMPHS PCT: 21 %
Lymphs Abs: 2.9 10*3/uL (ref 0.7–4.0)
MONOS PCT: 2 %
Metamyelocytes Relative: 0 %
Monocytes Absolute: 0.3 10*3/uL (ref 0.1–1.0)
Myelocytes: 0 %
NEUTROS ABS: 10.6 10*3/uL — AB (ref 1.7–7.7)
NEUTROS PCT: 77 %
NRBC: 0 /100{WBCs}
OTHER: 0 %
Promyelocytes Absolute: 0 %

## 2015-09-27 LAB — COMPREHENSIVE METABOLIC PANEL
ALBUMIN: 2.6 g/dL — AB (ref 3.5–5.0)
ALBUMIN: 1 g/dL — AB (ref 3.5–5.0)
ALK PHOS: 24 U/L — AB (ref 38–126)
ALT: 13 U/L — ABNORMAL LOW (ref 14–54)
ALT: 121 U/L — AB (ref 14–54)
ANION GAP: 13 (ref 5–15)
ANION GAP: 20 — AB (ref 5–15)
AST: 27 U/L (ref 15–41)
AST: 123 U/L — AB (ref 15–41)
Alkaline Phosphatase: 41 U/L (ref 38–126)
BILIRUBIN TOTAL: 0.7 mg/dL (ref 0.3–1.2)
BILIRUBIN TOTAL: 0.5 mg/dL (ref 0.3–1.2)
BUN: 20 mg/dL (ref 6–20)
BUN: 18 mg/dL (ref 6–20)
CALCIUM: 5.8 mg/dL — AB (ref 8.9–10.3)
CO2: 16 mmol/L — ABNORMAL LOW (ref 22–32)
CO2: 24 mmol/L (ref 22–32)
Calcium: 7.2 mg/dL — ABNORMAL LOW (ref 8.9–10.3)
Chloride: 110 mmol/L (ref 101–111)
Chloride: 101 mmol/L (ref 101–111)
Creatinine, Ser: 1.1 mg/dL — ABNORMAL HIGH (ref 0.44–1.00)
Creatinine, Ser: 1.22 mg/dL — ABNORMAL HIGH (ref 0.44–1.00)
GFR calc Af Amer: 54 mL/min — ABNORMAL LOW (ref >60)
GFR calc Af Amer: 48 mL/min — ABNORMAL LOW (ref >60)
GFR calc non Af Amer: 41 mL/min — ABNORMAL LOW (ref >60)
GFR, EST NON AFRICAN AMERICAN: 47 mL/min — AB (ref >60)
GLUCOSE: 292 mg/dL — AB (ref 65–99)
Glucose, Bld: 208 mg/dL — ABNORMAL HIGH (ref 65–99)
POTASSIUM: 3.6 mmol/L (ref 3.5–5.1)
Potassium: 4.7 mmol/L (ref 3.5–5.1)
Sodium: 139 mmol/L (ref 135–145)
Sodium: 145 mmol/L (ref 135–145)
TOTAL PROTEIN: 4.6 g/dL — AB (ref 6.5–8.1)

## 2015-09-27 LAB — CBC
HCT: 17 % — ABNORMAL LOW (ref 36.0–46.0)
HEMATOCRIT: 38.9 % (ref 36.0–46.0)
Hemoglobin: 12.5 g/dL (ref 12.0–15.0)
Hemoglobin: 5.6 g/dL — CL (ref 12.0–15.0)
MCH: 28.3 pg (ref 26.0–34.0)
MCH: 29.5 pg (ref 26.0–34.0)
MCHC: 32.1 g/dL (ref 30.0–36.0)
MCHC: 32.9 g/dL (ref 30.0–36.0)
MCV: 88 fL (ref 78.0–100.0)
MCV: 89.5 fL (ref 78.0–100.0)
PLATELETS: 253 10*3/uL (ref 150–400)
PLATELETS: 115 10*3/uL — AB (ref 150–400)
RBC: 4.42 MIL/uL (ref 3.87–5.11)
RBC: 1.9 MIL/uL — ABNORMAL LOW (ref 3.87–5.11)
RDW: 15.4 % (ref 11.5–15.5)
RDW: 15 % (ref 11.5–15.5)
WBC: 14.5 10*3/uL — AB (ref 4.0–10.5)
WBC: 13.8 10*3/uL — ABNORMAL HIGH (ref 4.0–10.5)

## 2015-09-27 LAB — BASIC METABOLIC PANEL
ANION GAP: 12 (ref 5–15)
Anion gap: 21 — ABNORMAL HIGH (ref 5–15)
BUN: 22 mg/dL — AB (ref 6–20)
BUN: 18 mg/dL (ref 6–20)
CALCIUM: 6 mg/dL — AB (ref 8.9–10.3)
CO2: 26 mmol/L (ref 22–32)
CO2: 24 mmol/L (ref 22–32)
Calcium: 9.2 mg/dL (ref 8.9–10.3)
Chloride: 101 mmol/L (ref 101–111)
Chloride: 107 mmol/L (ref 101–111)
Creatinine, Ser: 1.03 mg/dL — ABNORMAL HIGH (ref 0.44–1.00)
Creatinine, Ser: 1.16 mg/dL — ABNORMAL HIGH (ref 0.44–1.00)
GFR calc Af Amer: 51 mL/min — ABNORMAL LOW (ref >60)
GFR, EST AFRICAN AMERICAN: 59 mL/min — AB (ref >60)
GFR, EST NON AFRICAN AMERICAN: 51 mL/min — AB (ref >60)
GFR, EST NON AFRICAN AMERICAN: 44 mL/min — AB (ref >60)
GLUCOSE: 292 mg/dL — AB (ref 65–99)
Glucose, Bld: 90 mg/dL (ref 65–99)
POTASSIUM: 4.3 mmol/L (ref 3.5–5.1)
POTASSIUM: 4.9 mmol/L (ref 3.5–5.1)
SODIUM: 139 mmol/L (ref 135–145)
Sodium: 152 mmol/L — ABNORMAL HIGH (ref 135–145)

## 2015-09-27 LAB — CBC WITH DIFFERENTIAL/PLATELET
BASOS ABS: 0 10*3/uL (ref 0.0–0.1)
Basophils Relative: 0 %
EOS ABS: 0 10*3/uL (ref 0.0–0.7)
Eosinophils Relative: 0 %
HCT: 37.2 % (ref 36.0–46.0)
Hemoglobin: 12.7 g/dL (ref 12.0–15.0)
LYMPHS ABS: 3 10*3/uL (ref 0.7–4.0)
Lymphocytes Relative: 11 %
MCH: 30.2 pg (ref 26.0–34.0)
MCHC: 34.1 g/dL (ref 30.0–36.0)
MCV: 88.6 fL (ref 78.0–100.0)
MONO ABS: 2.2 10*3/uL — AB (ref 0.1–1.0)
Monocytes Relative: 8 %
Neutro Abs: 21.9 10*3/uL — ABNORMAL HIGH (ref 1.7–7.7)
Neutrophils Relative %: 81 %
Platelets: 216 10*3/uL (ref 150–400)
RBC: 4.2 MIL/uL (ref 3.87–5.11)
RDW: 14.9 % (ref 11.5–15.5)
WBC: 27.1 10*3/uL — AB (ref 4.0–10.5)

## 2015-09-27 LAB — PROTIME-INR
INR: 0.96 (ref 0.00–1.49)
INR: 2.07 — ABNORMAL HIGH (ref 0.00–1.49)
PROTHROMBIN TIME: 13 s (ref 11.6–15.2)
Prothrombin Time: 23.2 seconds — ABNORMAL HIGH (ref 11.6–15.2)

## 2015-09-27 LAB — MAGNESIUM
MAGNESIUM: 1.3 mg/dL — AB (ref 1.7–2.4)
Magnesium: 1.4 mg/dL — ABNORMAL LOW (ref 1.7–2.4)

## 2015-09-27 LAB — TROPONIN I: Troponin I: 0.13 ng/mL — ABNORMAL HIGH (ref <0.031)

## 2015-09-27 LAB — MRSA PCR SCREENING: MRSA BY PCR: NEGATIVE

## 2015-09-27 LAB — PREPARE RBC (CROSSMATCH)

## 2015-09-27 LAB — APTT
APTT: 21 s — AB (ref 24–37)
APTT: 56 s — AB (ref 24–37)

## 2015-09-27 LAB — GLUCOSE, CAPILLARY
GLUCOSE-CAPILLARY: 194 mg/dL — AB (ref 65–99)
GLUCOSE-CAPILLARY: 171 mg/dL — AB (ref 65–99)

## 2015-09-27 LAB — LACTIC ACID, PLASMA: LACTIC ACID, VENOUS: 16.5 mmol/L — AB (ref 0.5–2.0)

## 2015-09-27 LAB — HEMOGLOBIN AND HEMATOCRIT, BLOOD
HCT: 23 % — ABNORMAL LOW (ref 36.0–46.0)
HEMOGLOBIN: 7.7 g/dL — AB (ref 12.0–15.0)

## 2015-09-27 LAB — PHOSPHORUS
Phosphorus: 4 mg/dL (ref 2.5–4.6)
Phosphorus: 6.8 mg/dL — ABNORMAL HIGH (ref 2.5–4.6)

## 2015-09-27 SURGERY — EVACUATION HEMATOMA
Anesthesia: General | Laterality: Right

## 2015-09-27 MED ORDER — FENTANYL CITRATE (PF) 100 MCG/2ML IJ SOLN
INTRAMUSCULAR | Status: AC
  Filled 2015-09-27: qty 4

## 2015-09-27 MED ORDER — IPRATROPIUM-ALBUTEROL 0.5-2.5 (3) MG/3ML IN SOLN
3.0000 mL | RESPIRATORY_TRACT | Status: DC
  Administered 2015-09-28 – 2015-10-08 (×61): 3 mL via RESPIRATORY_TRACT
  Filled 2015-09-27 (×61): qty 3

## 2015-09-27 MED ORDER — IODIXANOL 320 MG/ML IV SOLN
200.0000 mL | Freq: Once | INTRAVENOUS | Status: DC | PRN
  Administered 2015-09-27: 65 mL via INTRAVENOUS
  Filled 2015-09-27: qty 200

## 2015-09-27 MED ORDER — CEFAZOLIN SODIUM-DEXTROSE 2-3 GM-% IV SOLR
INTRAVENOUS | Status: AC
  Administered 2015-09-27: 2 g via INTRAVENOUS
  Filled 2015-09-27: qty 50

## 2015-09-27 MED ORDER — CEFAZOLIN SODIUM-DEXTROSE 2-3 GM-% IV SOLR
2.0000 g | Freq: Once | INTRAVENOUS | Status: AC
  Administered 2015-09-27: 2 g via INTRAVENOUS

## 2015-09-27 MED ORDER — LIDOCAINE HCL 1 % IJ SOLN
INTRAMUSCULAR | Status: AC
  Filled 2015-09-27: qty 20

## 2015-09-27 MED ORDER — FENTANYL CITRATE (PF) 100 MCG/2ML IJ SOLN
INTRAMUSCULAR | Status: AC | PRN
  Administered 2015-09-27: 50 ug via INTRAVENOUS

## 2015-09-27 MED ORDER — SODIUM CHLORIDE 0.9 % IV SOLN
INTRAVENOUS | Status: DC | PRN
  Administered 2015-09-27: 22:00:00

## 2015-09-27 MED ORDER — DOPAMINE-DEXTROSE 3.2-5 MG/ML-% IV SOLN: 0.0000 ug/kg/min | INTRAVENOUS | Status: DC

## 2015-09-27 MED ORDER — ONDANSETRON HCL 4 MG/2ML IJ SOLN
INTRAMUSCULAR | Status: AC
  Administered 2015-09-27: 4 mg
  Filled 2015-09-27: qty 2

## 2015-09-27 MED ORDER — SODIUM CHLORIDE 0.9 % IV SOLN: 250.0000 mL | INTRAVENOUS | Status: DC | PRN

## 2015-09-27 MED ORDER — ONDANSETRON HCL 4 MG/2ML IJ SOLN
INTRAMUSCULAR | Status: AC | PRN
  Administered 2015-09-27: 4 mg via INTRAVENOUS

## 2015-09-27 MED ORDER — CALCIUM CHLORIDE 10 % IV SOLN
INTRAVENOUS | Status: DC | PRN
  Administered 2015-09-27 (×2): 1 g via INTRAVENOUS

## 2015-09-27 MED ORDER — DEXTROSE 5 % IV SOLN
2.0000 ug/min | INTRAVENOUS | Status: DC
  Administered 2015-09-27: 15 ug/min via INTRAVENOUS
  Administered 2015-09-28: 5 ug/min via INTRAVENOUS
  Filled 2015-09-27 (×2): qty 4

## 2015-09-27 MED ORDER — SODIUM BICARBONATE 8.4 % IV SOLN
INTRAVENOUS | Status: DC | PRN
  Administered 2015-09-27 (×3): 50 meq via INTRAVENOUS

## 2015-09-27 MED ORDER — HYDROCORTISONE NA SUCCINATE PF 100 MG IJ SOLR
50.0000 mg | Freq: Four times a day (QID) | INTRAMUSCULAR | Status: DC
  Administered 2015-09-27 – 2015-10-02 (×19): 50 mg via INTRAVENOUS
  Filled 2015-09-27 (×11): qty 1
  Filled 2015-09-27: qty 2
  Filled 2015-09-27 (×4): qty 1
  Filled 2015-09-27: qty 2
  Filled 2015-09-27 (×5): qty 1

## 2015-09-27 MED ORDER — FENTANYL CITRATE (PF) 250 MCG/5ML IJ SOLN
INTRAMUSCULAR | Status: DC | PRN
  Administered 2015-09-27: 100 ug via INTRAVENOUS

## 2015-09-27 MED ORDER — MIDAZOLAM HCL 2 MG/2ML IJ SOLN: 1.0000 mg | INTRAMUSCULAR | Status: DC | PRN

## 2015-09-27 MED ORDER — THROMBIN 20000 UNITS EX SOLR
CUTANEOUS | Status: AC
  Filled 2015-09-27: qty 20000

## 2015-09-27 MED ORDER — MIDAZOLAM HCL 2 MG/2ML IJ SOLN
INTRAMUSCULAR | Status: DC | PRN
  Administered 2015-09-27: 2 mg via INTRAVENOUS

## 2015-09-27 MED ORDER — 0.9 % SODIUM CHLORIDE (POUR BTL) OPTIME
TOPICAL | Status: DC | PRN
  Administered 2015-09-27: 2000 mL

## 2015-09-27 MED ORDER — SODIUM CHLORIDE 0.9 % IV SOLN: Freq: Once | INTRAVENOUS | Status: DC

## 2015-09-27 MED ORDER — FAMOTIDINE IN NACL 20-0.9 MG/50ML-% IV SOLN
20.0000 mg | INTRAVENOUS | Status: DC
  Administered 2015-09-27: 20 mg via INTRAVENOUS
  Filled 2015-09-27 (×2): qty 50

## 2015-09-27 MED ORDER — ROCURONIUM BROMIDE 100 MG/10ML IV SOLN
INTRAVENOUS | Status: DC | PRN
  Administered 2015-09-27 (×2): 50 mg via INTRAVENOUS

## 2015-09-27 MED ORDER — LACTATED RINGERS IV SOLN
INTRAVENOUS | Status: DC | PRN
  Administered 2015-09-27: 21:00:00 via INTRAVENOUS

## 2015-09-27 MED ORDER — SODIUM CHLORIDE 0.9 % IV SOLN
INTRAVENOUS | Status: DC | PRN
  Administered 2015-09-27: 21:00:00 via INTRAVENOUS

## 2015-09-27 MED ORDER — FENTANYL CITRATE (PF) 250 MCG/5ML IJ SOLN
INTRAMUSCULAR | Status: AC
  Filled 2015-09-27: qty 5

## 2015-09-27 MED ORDER — SODIUM CHLORIDE 0.9 % IV SOLN
Freq: Once | INTRAVENOUS | Status: AC
  Administered 2015-09-27: 11:00:00 via INTRAVENOUS

## 2015-09-27 MED ORDER — HEPARIN SODIUM (PORCINE) 1000 UNIT/ML IJ SOLN
INTRAMUSCULAR | Status: AC | PRN
  Administered 2015-09-27: 5000 [IU] via INTRAVENOUS

## 2015-09-27 MED ORDER — SODIUM CHLORIDE 0.9 % IV SOLN
INTRAVENOUS | Status: DC
  Administered 2015-09-27: 18:00:00 via INTRAVENOUS

## 2015-09-27 MED ORDER — CEFAZOLIN SODIUM-DEXTROSE 2-3 GM-% IV SOLR
INTRAVENOUS | Status: DC | PRN
  Administered 2015-09-27: 2 g via INTRAVENOUS

## 2015-09-27 MED ORDER — ONDANSETRON HCL 4 MG/2ML IJ SOLN
INTRAMUSCULAR | Status: AC
  Filled 2015-09-27: qty 2

## 2015-09-27 MED ORDER — DOPAMINE-DEXTROSE 3.2-5 MG/ML-% IV SOLN
INTRAVENOUS | Status: AC
  Administered 2015-09-27: 10 ug/kg/min via INTRAVENOUS
  Filled 2015-09-27: qty 250

## 2015-09-27 MED ORDER — BUDESONIDE 0.25 MG/2ML IN SUSP
0.2500 mg | Freq: Two times a day (BID) | RESPIRATORY_TRACT | Status: DC
  Administered 2015-09-28 – 2015-10-12 (×28): 0.25 mg via RESPIRATORY_TRACT
  Filled 2015-09-27 (×33): qty 2

## 2015-09-27 MED ORDER — FENTANYL CITRATE (PF) 100 MCG/2ML IJ SOLN: 50.0000 ug | Freq: Once | INTRAMUSCULAR | Status: DC

## 2015-09-27 MED ORDER — FENTANYL BOLUS VIA INFUSION
25.0000 ug | INTRAVENOUS | Status: DC | PRN
  Administered 2015-09-29: 25 ug via INTRAVENOUS
  Administered 2015-09-29: 50 ug via INTRAVENOUS
  Administered 2015-09-30 – 2015-10-06 (×5): 25 ug via INTRAVENOUS
  Administered 2015-10-06: 50 ug via INTRAVENOUS
  Administered 2015-10-06: 25 ug via INTRAVENOUS
  Administered 2015-10-07 (×2): 50 ug via INTRAVENOUS
  Administered 2015-10-07 – 2015-10-11 (×7): 25 ug via INTRAVENOUS
  Administered 2015-10-11: 50 ug via INTRAVENOUS
  Filled 2015-09-27: qty 25

## 2015-09-27 MED ORDER — ONDANSETRON HCL 4 MG/2ML IJ SOLN
4.0000 mg | Freq: Four times a day (QID) | INTRAMUSCULAR | Status: DC | PRN
  Administered 2015-09-27: 4 mg via INTRAVENOUS
  Filled 2015-09-27: qty 2

## 2015-09-27 MED ORDER — HEPARIN SODIUM (PORCINE) 1000 UNIT/ML IJ SOLN
INTRAMUSCULAR | Status: AC
  Filled 2015-09-27: qty 1

## 2015-09-27 MED ORDER — FENTANYL CITRATE (PF) 100 MCG/2ML IJ SOLN
12.5000 ug | INTRAMUSCULAR | Status: DC | PRN
  Administered 2015-10-04 – 2015-10-06 (×7): 25 ug via INTRAVENOUS
  Administered 2015-10-10 (×3): 50 ug via INTRAVENOUS
  Filled 2015-09-27 (×2): qty 2

## 2015-09-27 MED ORDER — ATROPINE SULFATE 0.1 MG/ML IJ SOLN
INTRAMUSCULAR | Status: AC
  Administered 2015-09-27: 19:00:00
  Filled 2015-09-27: qty 10

## 2015-09-27 MED ORDER — CEFAZOLIN SODIUM-DEXTROSE 2-3 GM-% IV SOLR
INTRAVENOUS | Status: AC
  Filled 2015-09-27: qty 50

## 2015-09-27 MED ORDER — MIDAZOLAM HCL 2 MG/2ML IJ SOLN
INTRAMUSCULAR | Status: AC | PRN
  Administered 2015-09-27: 1 mg via INTRAVENOUS

## 2015-09-27 MED ORDER — MIDAZOLAM HCL 2 MG/2ML IJ SOLN
INTRAMUSCULAR | Status: AC
  Filled 2015-09-27: qty 6

## 2015-09-27 MED ORDER — SODIUM CHLORIDE 0.9 % IV SOLN
25.0000 ug/h | INTRAVENOUS | Status: DC
  Administered 2015-09-28 (×2): 50 ug/h via INTRAVENOUS
  Administered 2015-09-29: 150 ug/h via INTRAVENOUS
  Administered 2015-09-30: 100 ug/h via INTRAVENOUS
  Administered 2015-10-01: 150 ug/h via INTRAVENOUS
  Administered 2015-10-01: 50 ug/h via INTRAVENOUS
  Administered 2015-10-01: 70 ug/h via INTRAVENOUS
  Administered 2015-10-02 (×2): 100 ug/h via INTRAVENOUS
  Administered 2015-10-02: 50 ug/h via INTRAVENOUS
  Administered 2015-10-02: 100 ug/h via INTRAVENOUS
  Administered 2015-10-03: 50 ug/h via INTRAVENOUS
  Administered 2015-10-03: 70 ug/h via INTRAVENOUS
  Administered 2015-10-03 – 2015-10-04 (×4): 50 ug/h via INTRAVENOUS
  Administered 2015-10-05: 15 ug/h via INTRAVENOUS
  Administered 2015-10-07: 75 ug/h via INTRAVENOUS
  Administered 2015-10-09: 50 ug/h via INTRAVENOUS
  Administered 2015-10-11: 150 ug/h via INTRAVENOUS
  Filled 2015-09-27 (×11): qty 50

## 2015-09-27 MED ORDER — MIDAZOLAM HCL 2 MG/2ML IJ SOLN
INTRAMUSCULAR | Status: AC
  Filled 2015-09-27: qty 4

## 2015-09-27 SURGICAL SUPPLY — 48 items
CANISTER SUCTION 2500CC (MISCELLANEOUS) ×4 IMPLANT
CANNULA VESSEL 3MM 2 BLNT TIP (CANNULA) ×2 IMPLANT
CLIP TI MEDIUM 24 (CLIP) ×2 IMPLANT
CLIP TI WIDE RED SMALL 24 (CLIP) ×2 IMPLANT
DRAIN SNY 10X20 3/4 PERF (WOUND CARE) IMPLANT
DRAIN SNY WOU (WOUND CARE) IMPLANT
DRAPE ORTHO SPLIT 77X108 STRL (DRAPES)
DRAPE SURG ORHT 6 SPLT 77X108 (DRAPES) IMPLANT
DRSG COVADERM 4X14 (GAUZE/BANDAGES/DRESSINGS) ×2 IMPLANT
DRSG COVADERM 4X6 (GAUZE/BANDAGES/DRESSINGS) ×2 IMPLANT
DRSG COVADERM 4X8 (GAUZE/BANDAGES/DRESSINGS) ×2 IMPLANT
ELECT BLADE 6.5 EXT (BLADE) ×2 IMPLANT
ELECT REM PT RETURN 9FT ADLT (ELECTROSURGICAL) ×2
ELECTRODE REM PT RTRN 9FT ADLT (ELECTROSURGICAL) ×1 IMPLANT
EVACUATOR SILICONE 100CC (DRAIN) IMPLANT
GLOVE BIO SURGEON STRL SZ7 (GLOVE) ×2 IMPLANT
GLOVE BIO SURGEON STRL SZ7.5 (GLOVE) ×2 IMPLANT
GLOVE BIOGEL PI IND STRL 7.0 (GLOVE) ×1 IMPLANT
GLOVE BIOGEL PI IND STRL 7.5 (GLOVE) ×1 IMPLANT
GLOVE BIOGEL PI INDICATOR 7.0 (GLOVE) ×1
GLOVE BIOGEL PI INDICATOR 7.5 (GLOVE) ×1
GLOVE ECLIPSE 7.0 STRL STRAW (GLOVE) ×2 IMPLANT
GOWN STRL REUS W/ TWL LRG LVL3 (GOWN DISPOSABLE) ×3 IMPLANT
GOWN STRL REUS W/TWL LRG LVL3 (GOWN DISPOSABLE) ×3
KIT BASIN OR (CUSTOM PROCEDURE TRAY) ×2 IMPLANT
KIT ROOM TURNOVER OR (KITS) ×2 IMPLANT
LIQUID BAND (GAUZE/BANDAGES/DRESSINGS) ×2 IMPLANT
NS IRRIG 1000ML POUR BTL (IV SOLUTION) ×4 IMPLANT
PACK GENERAL/GYN (CUSTOM PROCEDURE TRAY) ×2 IMPLANT
PACK PERIPHERAL VASCULAR (CUSTOM PROCEDURE TRAY) ×2 IMPLANT
PAD ARMBOARD 7.5X6 YLW CONV (MISCELLANEOUS) ×4 IMPLANT
PENCIL BUTTON HOLSTER BLD 10FT (ELECTRODE) ×2 IMPLANT
RETAINER VISCERA MED (MISCELLANEOUS) ×2 IMPLANT
SPONGE LAP 18X18 X RAY DECT (DISPOSABLE) ×8 IMPLANT
SPONGE SURGIFOAM ABS GEL 100 (HEMOSTASIS) IMPLANT
STAPLER VISISTAT 35W (STAPLE) IMPLANT
SUT ETHILON 3 0 PS 1 (SUTURE) IMPLANT
SUT PDS AB 1 TP1 54 (SUTURE) ×14 IMPLANT
SUT PROLENE 5 0 C 1 24 (SUTURE) ×4 IMPLANT
SUT PROLENE 6 0 CC (SUTURE) ×4 IMPLANT
SUT VIC AB 2-0 CTX 36 (SUTURE) ×2 IMPLANT
SUT VIC AB 3-0 SH 27 (SUTURE) ×2
SUT VIC AB 3-0 SH 27X BRD (SUTURE) ×2 IMPLANT
TRAY FOLEY W/METER SILVER 16FR (SET/KITS/TRAYS/PACK) IMPLANT
TUBE CONNECTING 12X1/4 (SUCTIONS) ×2 IMPLANT
UNDERPAD 30X30 INCONTINENT (UNDERPADS AND DIAPERS) ×2 IMPLANT
WATER STERILE IRR 1000ML POUR (IV SOLUTION) ×2 IMPLANT
YANKAUER SUCT BULB TIP NO VENT (SUCTIONS) ×4 IMPLANT

## 2015-09-27 NOTE — Sedation Documentation (Signed)
Distal aorta, 167/73, (108); external iliac 148/72, (104)

## 2015-09-27 NOTE — Anesthesia Procedure Notes (Addendum)
Procedure Name: Intubation Date/Time: 09/30/2015 6:54 PM Performed by: Fabian NovemberSOLHEIM, Eddye Broxterman SALOMAN Pre-anesthesia Checklist: Patient identified, Patient being monitored, Timeout performed, Emergency Drugs available and Suction available Patient Re-evaluated:Patient Re-evaluated prior to inductionOxygen Delivery Method: Ambu bag Preoxygenation: Pre-oxygenation with 100% oxygen Ventilation: Mask ventilation without difficulty Laryngoscope Size: McGraph and 3 Grade View: Grade I Tube type: Oral Tube size: 7.5 mm Number of attempts: 2 (First DL with MAC 3 unable to obtain view (Gr 4). Second DL with McGraph 3 grade 1 view. ) Airway Equipment and Method: Stylet Placement Confirmation: ETT inserted through vocal cords under direct vision,  breath sounds checked- equal and bilateral and CO2 detector Secured at: 21 cm Tube secured with: Tape Dental Injury: Teeth and Oropharynx as per pre-operative assessment  Comments: Removed pt's upper dentures before first DL and gave them to RN.

## 2015-09-27 NOTE — Sedation Documentation (Signed)
Denies pain

## 2015-09-27 NOTE — Progress Notes (Signed)
Arterial pressures: distal aorta, 167/73 (108); external iliac 148/72, (104)

## 2015-09-27 NOTE — OR Nursing (Signed)
1st call to 2100 @0955 .

## 2015-09-27 NOTE — H&P (Signed)
Chief Complaint: Patient was seen in consultation today for No chief complaint on file.  at the request of Yvonna Brun  Referring Physician(s): Shandrea Lusk  History of Present Illness: [name] Herman is a 80 y.o. female with continued "blue toe" syndrome. The continues but has improved. She continues to require narcotics. She denies fever. Her appointment with the wound care clinic will be 09/30/2015.  Past Medical History  Diagnosis Date  . ANEMIA 09/25/2010  . DEPRESSION 12/02/2009  . ESSENTIAL HYPERTENSION 02/10/2010  . ASTHMA 12/02/2009  . COPD 10/03/2010  . PEPTIC ULCER DISEASE 10/23/2010  . NEPHROLITHIASIS 12/02/2009  . OSTEOARTHRITIS 01/27/2010  . Pyloric stenosis   . Peptic ulcer, unspecified site, unspecified as acute or chronic, without mention of hemorrhage, perforation, or obstruction   . Gastritis     Past Surgical History  Procedure Laterality Date  . Abdominal hysterectomy  1970  . Back surgery  1984  . Kidney surgery  1970    Allergies: Meloxicam and Meperidine hcl  Medications: Prior to Admission medications   Medication Sig Start Date End Date Taking? Authorizing Provider  albuterol (PROVENTIL HFA;VENTOLIN HFA) 108 (90 BASE) MCG/ACT inhaler Inhale 2 puffs into the lungs every 4 (four) hours as needed for wheezing. 06/22/15 04/18/17 Yes Kristian Covey, MD  aspirin EC 81 MG tablet Take 81 mg by mouth daily.   Yes Historical Provider, MD  atorvastatin (LIPITOR) 10 MG tablet Take 1 tablet (10 mg total) by mouth daily. 08/25/15  Yes Kristian Covey, MD  Biotin 1000 MCG tablet Take 1,000 mcg by mouth daily.   Yes Historical Provider, MD  Calcium Carb-Cholecalciferol (CALCIUM 1000 + D PO) Take 1 tablet by mouth daily.    Yes Historical Provider, MD  clopidogrel (PLAVIX) 75 MG tablet Take 1 tablet (75 mg total) by mouth daily. 08/22/15  Yes Kristian Covey, MD  cyanocobalamin 500 MCG tablet Take 500 mcg by mouth daily.   Yes Historical Provider, MD    latanoprost (XALATAN) 0.005 % ophthalmic solution Place 1 drop into the right eye at bedtime.     Yes Historical Provider, MD  mometasone [facility]) 220 MCG/INH inhaler Inhale 2 puffs into the lungs daily. 01/05/15  Yes Kristian Covey, MD  Multiple Vitamins-Minerals (OCUVITE PO) Take by mouth.   Yes Historical Provider, MD  oxyCODONE-acetaminophen (PERCOCET) 10-325 MG per tablet Take 1 tablet by mouth every 6 (six) hours as needed for pain. 07/28/15 07/27/16 Yes Kristian Covey, MD  pantoprazole (PROTONIX) 40 MG tablet TAKE 1 TABLET (40 MG TOTAL) BY MOUTH DAILY. 07/14/15  Yes Kristian Covey, MD  predniSONE (DELTASONE) 10 MG tablet Take 10 mg by mouth daily with breakfast.   Yes Historical Provider, MD  sertraline (ZOLOFT) 100 MG tablet Take 0.5 tablets (50 mg total) by mouth daily. 07/14/15  Yes Kristian Covey, MD  triamterene-hydrochlorothiazide (DYAZIDE) 37.5-25 MG capsule TAKE ONE CAPSULE BY MOUTH EVERY MORNING 08/29/15  Yes Kristian Covey, MD     Family History  Problem Relation Age of Onset  . Prostate cancer Father     prostate  . Colon cancer Neg Hx     Social History   Social History  . Marital Status: Married    Spouse Name: N/A  . Number of Children: 3  . Years of Education: N/A   Occupational History  . retired    Social History Main Topics  . Smoking status: Never Smoker   . Smokeless tobacco: Never Used  .  Alcohol Use: No  . Drug Use: No  . Sexual Activity: Not on file   Other Topics Concern  . Not on file   Social History Narrative      Review of Systems: A 12 point ROS discussed and pertinent positives are indicated in the HPI above.  All other systems are negative.  Review of Systems  Vital Signs: BP 183/78 mmHg  Pulse 88  Temp(Src) 98 F (36.7 C) (Oral)  Resp 18  Ht 5' (1.524 m)  Wt 102 lb (46.267 kg)  BMI 19.92 kg/m2  SpO2 97%  Physical Exam  Constitutional: She is oriented to person, place, and time. She appears well-developed and  well-nourished.  Cardiovascular: Normal rate, regular rhythm and normal heart sounds.   Pulmonary/Chest: Effort normal and breath sounds normal.  Musculoskeletal:  There is a small eschar under the tip of the right fourth toe. The third toe is erythematous without gangrene. There is dry gangrene involving the undersurface and nailbed of the second toe. The foot is warm and pink.  Neurological: She is alert and oriented to person, place, and time.    Mallampati Score:   2  Imaging: No results found.  Labs:  CBC:  Recent Labs  10/04/2015 1030  WBC 14.5*  HGB 12.5  HCT 38.9  PLT 253    COAGS:  Recent Labs  10/24/2015 1030  INR 0.96  APTT 21*    BMP:  Recent Labs  11/01/14 1147 08/02/15 1427 10/03/2015 1030  NA 139 142 139  K 4.3 3.6 4.3  CL 105 97* 101  CO2 26 32* 26  GLUCOSE 89 94 90  BUN 16 17 22*  CALCIUM 9.2 9.7 9.2  CREATININE 0.9 1.07* 1.03*  GFRNONAA  --   --  51*  GFRAA  --   --  59*    LIVER FUNCTION TESTS:  Recent Labs  08/24/15 0818  BILITOT 0.3  AST 16  ALT 12  ALKPHOS 61  PROT 6.6  ALBUMIN 3.7    TUMOR MARKERS: No results for input(s): AFPTM, CEA, CA199, CHROMGRNA in the last 8760 hours.  Assessment and Plan:  R iliac artery stenosis, here for iliac artery stent. There is a possible proximal right SFA stenosis obscured by the THA. This will be assessed during the runoff.  Thank you for this interesting consult.  I greatly enjoyed meeting [name] Herman and look forward to participating in their care.  A copy of this report was sent to the requesting provider on this date.  Signed: Kylene Zamarron, ART A 10/15/2015, 11:17 AM    I spent a total of  40 Minutes   in face to face in clinical consultation, greater than 50% of which was counseling/coordinating care for iliac artery stenosis.

## 2015-09-27 NOTE — H&P (Signed)
PULMONARY / CRITICAL CARE MEDICINE   Name: [name] Herman MRN: [id number] DOB: [birth year]    ADMISSION DATE:  10-13-2015 CONSULTATION DATE:  10-13-15  REFERRING MD :  Dr. Bonnielee Haff, IR  CHIEF COMPLAINT:  RP bleeding after IR procedure  INITIAL PRESENTATION: 80 year old female who presented to [facility] 10/13/15 for iliac artery stent placement. Procedure was done, no stent needed. Post-operatively she complained of nausea and had hypotension. Distended abdomen. CT with with retroperitoneal blood, not actively bleeding per Dr. Bonnielee Haff. To ICU for PCCM management.   STUDIES:  CT (A) abd/pelvis (11/1) >>> Lower extremity arterial duplex (11/1)   SIGNIFICANT EVENTS: 11/1 to IR for iliac stent, stent not needed, post op RP blood. Shock. To ICU   HISTORY OF PRESENT ILLNESS:  80 year old female with PMH as below, which includes HTN, COPD, PUD, Anemia. She is on chronic prednisone due to polymyalgia rheumatica. She first noted some blue coloration to R toe in early August of this year and presented to PCP. "Blue toe syndrome" was suspected which PCP notes are typically due to cholesterol emboli. She was sent for arterial Dopplers and these were significant for right ABI 0.68 and left ABI 1.19. Cyanotic changes were worsening to R toes. She saw IR who confirmed blue toe syndrome. She was started on aspirin and high dose statin therapy. She also had R iliac artery stenosis. She was sheduled for IR procedure 11/1. She presented for R iliac stent. Dr. Bonnielee Haff perfromed the procedure and noted. Irregular right CIA plaque (pressure gradient is 4 mm Hg MAP). CFA/SFA/Pop art patent with one vessel runoff (AT/DP). She did not require stenting. While in recovery room she developed nausea/vomiting and became hypotensive. Abdomen became distended. She was reevaluated by Dr. Bonnielee Haff who took her to CT to evaluate her abdomen. It appeared as though there was retroperitoneal bleeding. CTA appeared as though  this had stopped. However, her hypotension persisted despite IVF so she was started on dopamine and emergently transfused 2 units PRBC. She was taken to ICU for PCCM management.  PAST MEDICAL HISTORY :   has a past medical history of ANEMIA (09/25/2010); DEPRESSION (12/02/2009); ESSENTIAL HYPERTENSION (02/10/2010); ASTHMA (12/02/2009); COPD (10/03/2010); PEPTIC ULCER DISEASE (10/23/2010); NEPHROLITHIASIS (12/02/2009); OSTEOARTHRITIS (01/27/2010); Pyloric stenosis; Peptic ulcer, unspecified site, unspecified as acute or chronic, without mention of hemorrhage, perforation, or obstruction; and Gastritis.  has past surgical history that includes Abdominal hysterectomy (1970); Back surgery (1984); and Kidney surgery (1970). Prior to Admission medications   Medication Sig Start Date End Date Taking? Authorizing Provider  albuterol (PROVENTIL HFA;VENTOLIN HFA) 108 (90 BASE) MCG/ACT inhaler Inhale 2 puffs into the lungs every 4 (four) hours as needed for wheezing. 06/22/15 04/18/17 Yes Kristian Covey, MD  aspirin EC 81 MG tablet Take 81 mg by mouth daily.   Yes Historical Provider, MD  atorvastatin (LIPITOR) 10 MG tablet Take 1 tablet (10 mg total) by mouth daily. 08/25/15  Yes Kristian Covey, MD  Biotin 1000 MCG tablet Take 1,000 mcg by mouth daily.   Yes Historical Provider, MD  Calcium Carb-Cholecalciferol (CALCIUM 1000 + D PO) Take 1 tablet by mouth daily.    Yes Historical Provider, MD  clopidogrel (PLAVIX) 75 MG tablet Take 1 tablet (75 mg total) by mouth daily. 08/22/15  Yes Kristian Covey, MD  cyanocobalamin 500 MCG tablet Take 500 mcg by mouth daily.   Yes Historical Provider, MD  latanoprost (XALATAN) 0.005 % ophthalmic solution Place 1 drop into the  right eye at bedtime.     Yes Historical Provider, MD  mometasone [facility]) 220 MCG/INH inhaler Inhale 2 puffs into the lungs daily. 01/05/15  Yes Kristian Covey, MD  Multiple Vitamins-Minerals (OCUVITE PO) Take by mouth.   Yes Historical Provider, MD   oxyCODONE-acetaminophen (PERCOCET) 10-325 MG per tablet Take 1 tablet by mouth every 6 (six) hours as needed for pain. 07/28/15 07/27/16 Yes Kristian Covey, MD  pantoprazole (PROTONIX) 40 MG tablet TAKE 1 TABLET (40 MG TOTAL) BY MOUTH DAILY. 07/14/15  Yes Kristian Covey, MD  predniSONE (DELTASONE) 10 MG tablet Take 10 mg by mouth daily with breakfast.   Yes Historical Provider, MD  sertraline (ZOLOFT) 100 MG tablet Take 0.5 tablets (50 mg total) by mouth daily. 07/14/15  Yes Kristian Covey, MD  triamterene-hydrochlorothiazide (DYAZIDE) 37.5-25 MG capsule TAKE ONE CAPSULE BY MOUTH EVERY MORNING 08/29/15  Yes Kristian Covey, MD   Allergies  Allergen Reactions  . Meloxicam     GI upset  . Meperidine Hcl     GI upset    FAMILY HISTORY:  has no family status information on file.  SOCIAL HISTORY:  reports that she has never smoked. She has never used smokeless tobacco. She reports that she does not drink alcohol or use illicit drugs.  REVIEW OF SYSTEMS: Bolds are positive  Constitutional: weight loss, gain, night sweats, Fevers, chills, fatigue .  HEENT: headaches, Sore throat, sneezing, nasal congestion, post nasal drip, Difficulty swallowing, Tooth/dental problems, visual complaints visual changes, ear ache CV:  chest pain, radiates: ,Orthopnea, PND, swelling in lower extremities, dizziness, palpitations, syncope.  GI  heartburn, indigestion, abdominal pain, nausea, vomiting, diarrhea, change in bowel habits, loss of appetite, bloody stools.  Resp: cough, productive: , hemoptysis, dyspnea, chest pain, pleuritic.  Skin: rash or itching or icterus GU: dysuria, change in color of urine, urgency or frequency. flank pain, hematuria  MS: joint pain or swelling. decreased range of motion  Psych: change in mood or affect. depression or anxiety.  Neuro: difficulty with speech, weakness, numbness, ataxia    SUBJECTIVE:   VITAL SIGNS: Temp:  [98 F (36.7 C)-98.3 F (36.8 C)] 98.3 F  (36.8 C) (11/01 1317) Pulse Rate:  [87-106] 97 (11/01 1440) Resp:  [13-24] 24 (11/01 1430) BP: (62-190)/(30-105) 76/30 mmHg (11/01 1440) SpO2:  [96 %-100 %] 100 % (11/01 1440) Weight:  [46.267 kg (102 lb)] 46.267 kg (102 lb) (11/01 0938) HEMODYNAMICS:   VENTILATOR SETTINGS:   INTAKE / OUTPUT: No intake or output data in the 24 hours ending 10-23-15 1516  PHYSICAL EXAMINATION: General:  Elderly female in mild distress Neuro:  Drowsy HEENT:  [place]/AT, no JVD, PERRL Cardiovascular:  Tachy, regular no MRG Lungs:  Clear bilateral breath sounds Abdomen:  Soft, non-tender, non-distended Musculoskeletal:  No acute deformity or ROM limitation Skin:  Necrotic appearing R toes.   LABS:  CBC  Recent Labs Lab 2015-10-23 1030  WBC 14.5*  HGB 12.5  HCT 38.9  PLT 253   Coag's  Recent Labs Lab 10-23-2015 1030  APTT 21*  INR 0.96   BMET  Recent Labs Lab 2015-10-23 1030  NA 139  K 4.3  CL 101  CO2 26  BUN 22*  CREATININE 1.03*  GLUCOSE 90   Electrolytes  Recent Labs Lab 2015-10-23 1030  CALCIUM 9.2   Sepsis Markers No results for input(s): LATICACIDVEN, PROCALCITON, O2SATVEN in the last 168 hours. ABG No results for input(s): PHART, PCO2ART, PO2ART in the last 168 hours. Liver  Enzymes No results for input(s): AST, ALT, ALKPHOS, BILITOT, ALBUMIN in the last 168 hours. Cardiac Enzymes No results for input(s): TROPONINI, PROBNP in the last 168 hours. Glucose  Recent Labs Lab 11/28/2014 1356  GLUCAP 194*    Imaging No results found.   ASSESSMENT / PLAN:  PULMONARY A: COPD without acute exacerbation  P:   Supplemental O2 as needed to keep SpO2 > 92% Scheduled duoneb, budesonide Intubation risk if further deterioration  CARDIOVASCULAR CVL LIJ sheath 11/1 >>>  A:  Hemorrhagic shock Blue toe syndrome PAD H/o HTN  P:  Telemetry MAP goal > 65 mm/Hg Volume resuscitation, levophed titrate to achieve MAP goal Transfuse as below Hold plavis/asa Hold home  HCTZ  RENAL A:   CKD  P:   Follow Bmet IVF hydration  GASTROINTESTINAL A:   H/o PUD  P:   Pepcid for SUP NPO  HEMATOLOGIC A:   Acute retroperitoneal hemorrhage s/p IR arteriogram   P:  Serial H&H S/p 2 units PRBC Transfuse 2 more units PRBC (4 total), if still not stable vascular surgery will take to OR for exploration Close monitoring IR and vascular following SCD  INFECTIOUS A:   No acute issues  P:   Surgical ppx with ancef Follow WBC and fever curve  ENDOCRINE A:   Chronic prednisone for polymyalgia rheumatica   P:   Stress dose steroids Follow glucose on Bmet  NEUROLOGIC A:   Acute metabolic encephalopathy in setting shock  P:   RASS goal: 0 Monitor closely   FAMILY  - Updates:   - Inter-disciplinary family meet or Palliative Care meeting due by:  11/7   Joneen RoachPaul Hoffman, AGACNP-BC [place] [facility] Pager [phone] or [phone]  10/22/2015 4:14 PM

## 2015-09-27 NOTE — Discharge Instructions (Signed)
Angiogram  An angiogram is an X-ray test. It is used to look at your blood vessels. For this test, a dye is put into the blood vessel being checked. The dye shows up on X-rays. It helps your doctor see if there is a blockage or other problem in the blood vessel.  BEFORE THE PROCEDURE  · Follow your doctor's instructions about limiting what you eat or drink.  · Ask your doctor if you may drink enough water to take any needed medicines the morning of the test.  · Plan to have someone take you home after the test.  · If you go home the same day as the test, plan to have someone stay with you for 24 hours.  PROCEDURE   · An IV tube will be put into one of your veins.  · You will be given a medicine that makes you relax (sedative).  · Your skin will be washed and shaved where the thin tube (catheter) will be inserted. This will usually be done in the upper part of your leg (groin). It may also be done in your arm near the elbow or in your wrist.  · You will be given a medicine that numbs the area where the tube will be inserted (local anesthetic).  · The tube will be inserted into a blood vessel.  · Using a type of X-ray (fluoroscopy) to see, your doctor will move the tube into the blood vessel to check it.  · Dye will be put in through the tube. X-rays of your blood vessels will then be taken.  Different health care providers and hospitals may do this procedure differently.  AFTER THE PROCEDURE   · If the test is done through the leg, you will be kept in bed lying flat for several hours. You will be told to not bend or cross your legs.    · The area where the tube was inserted will be checked often.    · The pulse in your feet or wrist will be checked often.    · More tests or X-rays may be done.       This information is not intended to replace advice given to you by your health care provider. Make sure you discuss any questions you have with your health care provider.     Document Released: 02/08/2009 Document  Revised: 12/03/2014 Document Reviewed: 04/15/2013  Elsevier Interactive Patient Education ©2016 [employer]

## 2015-09-27 NOTE — Sedation Documentation (Addendum)
Pedal pulses doppler by Dr. Bonnielee HaffHoss. Several toes are black to R foot- been like that since  July per pt.

## 2015-09-27 NOTE — Progress Notes (Signed)
Patient ID: [name] Herman, female   DOB: 07/06/1935, 80 y.o.   MRN: [id number] I was called to the bedside because of hypotension. The patient was lucid but with SBP 80's. I noticed her abdomen was tense and distended. A stat CTA abdomen demonstrated a large retroperitoneal hemorrhage without active bleeding. She was resuscitated successfully in the ICU. Currently, her SBP is 110 and pulse 100's. She is lucid and complains of back pain. Will continue ICU admission and observation.

## 2015-09-27 NOTE — Progress Notes (Signed)
During code blue 2 unit PRBC ordered and rapidly infused around 1930. The first unit # was [id number]. 2nd unit # [id number]. MD at bedside and continous vitals taken during infusion. Lorin PicketLindsey Peityn Payton, RN

## 2015-09-27 NOTE — Progress Notes (Signed)
*  PRELIMINARY RESULTS* Vascular Ultrasound Right groin limited arterial duplex has been completed.  Preliminary findings: No evidence of pseudoaneurysm. A hematoma is noted measuring 3.55cm.   Farrel DemarkJill Eunice, RDMS, RVT  10/11/2015, 4:26 PM

## 2015-09-27 NOTE — Procedures (Signed)
Arterial Catheter Insertion Procedure Note Orma FlamingMartha H State [id number] 12/01/1934  Procedure: Insertion of Arterial Catheter  Indications: Blood pressure monitoring  Procedure Details Consent: Unable to obtain consent because of emergent medical necessity. Time Out: Verified patient identification, verified procedure, site/side was marked, verified correct patient position, special equipment/implants available, medications/allergies/relevent history reviewed, required imaging and test results available.  Performed  Maximum sterile technique was used including antiseptics, cap, gloves, gown, hand hygiene, mask and sheet. Skin prep: Chlorhexidine; local anesthetic administered 20 gauge catheter was inserted into right femoral artery using the Seldinger technique.  Evaluation Blood flow good; BP tracing good. Complications: No apparent complications.   Joneen RoachPaul Jenasis Straley, AGACNP-BC [facility] Pager [phone] or [phone]  10/11/2015 7:46 PM

## 2015-09-27 NOTE — Sedation Documentation (Signed)
R groin site- pressure being held

## 2015-09-27 NOTE — Sedation Documentation (Signed)
No stent needed - no treatment necessary. Pt will remain on Plavix medicine. MD will speak with family

## 2015-09-27 NOTE — Anesthesia Preprocedure Evaluation (Addendum)
Anesthesia Evaluation   Patient unresponsive  Preop documentation limited or incomplete due to emergent nature of procedure.  Airway Mallampati: Intubated       Dental   Pulmonary asthma , COPD,    breath sounds clear to auscultation       Cardiovascular hypertension, + Peripheral Vascular Disease   Rhythm:Regular Rate:Tachycardia     Neuro/Psych    GI/Hepatic Neg liver ROS, PUD,   Endo/Other    Renal/GU Renal InsufficiencyRenal disease     Musculoskeletal  (+) Arthritis ,   Abdominal   Peds  Hematology  (+) anemia ,   Anesthesia Other Findings   Reproductive/Obstetrics                            Anesthesia Physical Anesthesia Plan  ASA: V and emergent  Anesthesia Plan: General   Post-op Pain Management:    Induction: Inhalational  Airway Management Planned: Oral ETT  Additional Equipment: Arterial line  Intra-op Plan:   Post-operative Plan: Post-operative intubation/ventilation  Informed Consent: I have reviewed the patients History and Physical, chart, labs and discussed the procedure including the risks, benefits and alternatives for the proposed anesthesia with the patient or authorized representative who has indicated his/her understanding and acceptance.   Only emergency history available  Plan Discussed with: CRNA and Surgeon  Anesthesia Plan Comments:         Anesthesia Quick Evaluation

## 2015-09-27 NOTE — Procedures (Signed)
Percutaneous Sheath Introducer Insertion Procedure Note  [name] FlamingMartha H Herman [id number] 07/25/1935  Procedure: Insertion of Cordis Indications: Drug and/or fluid administration  Procedure Details Consent: Unable to obtain consent because of emergent medical necessity. Time Out: Verified patient identification, verified procedure, site/side was marked, verified correct patient position, special equipment/implants available, medications/allergies/relevent history reviewed, required imaging and test results available.  Performed  Maximum sterile technique was used including antiseptics, cap, gloves, gown, hand hygiene, mask and sheet. Skin prep: Chlorhexidine; local anesthetic administered A antimicrobial bonded/coated single lumen catheter was placed in the left internal jugular vein using the Seldinger technique. Ultrasound guidance used.Yes.   Catheter placed to 16 cm. Blood aspirated via port and then flushed. Line sutured x 2 and dressing applied.  Evaluation Blood flow good Complications: No apparent complications Patient did tolerate procedure well. Chest X-ray ordered to verify placement.  CXR: normal.  Joneen RoachPaul Megham Dwyer, AGACNP-BC [facility] Pager [phone] or [phone]  10/18/2015 4:42 PM

## 2015-09-27 NOTE — Consult Note (Addendum)
[facility] [place] HISTORY AND PHYSICAL   History of Present Illness:  Patient is a 80 y.o. year old female who presents for evaluation of retroperitoneal hematoma post angiogram today.  Pt had diagnostic angio for gangrene tip of toe right foot.  No intervention was performed.  Pt became hypotensive post procedure.  CT was obtained which showed right side retroperitoneal hematoma.  Pt received 2 Units of PRBC and dopamine.  The dopamine is currently being weaned off. Pt complains of right side back pain, no abdominal pain.  She is very agitated and wiggling on the bed.  Other medical problems include hypertension which has been stable  Past Medical History  Diagnosis Date  . ANEMIA 09/25/2010  . DEPRESSION 12/02/2009  . ESSENTIAL HYPERTENSION 02/10/2010  . ASTHMA 12/02/2009  . COPD 10/03/2010  . PEPTIC ULCER DISEASE 10/23/2010  . NEPHROLITHIASIS 12/02/2009  . OSTEOARTHRITIS 01/27/2010  . Pyloric stenosis   . Peptic ulcer, unspecified site, unspecified as acute or chronic, without mention of hemorrhage, perforation, or obstruction   . Gastritis     Past Surgical History  Procedure Laterality Date  . Abdominal hysterectomy  1970  . Back surgery  1984  . Kidney surgery  1970    Social History Social History  Substance Use Topics  . Smoking status: Never Smoker   . Smokeless tobacco: Never Used  . Alcohol Use: No    Family History Family History  Problem Relation Age of Onset  . Prostate cancer Father     prostate  . Colon cancer Neg Hx     Allergies  Allergies  Allergen Reactions  . Meloxicam     GI upset  . Meperidine Hcl     GI upset     Current Facility-Administered Medications  Medication Dose Route Frequency Provider Last Rate Last Dose  . 0.9 %  sodium chloride infusion   Intravenous Once Duayne Cal, NP      . 0.9 %  sodium chloride infusion  250 mL Intravenous PRN Cyril Mourning V, MD      . 0.9 %  sodium chloride infusion   Intravenous  Continuous Cyril Mourning V, MD      . DOPamine (INTROPIN) 800 mg in dextrose 5 % 250 mL (3.2 mg/mL) infusion  0-20 mcg/kg/min Intravenous Continuous Cyril Mourning V, MD      . fentaNYL (SUBLIMAZE) 100 MCG/2ML injection           . heparin 1000 UNIT/ML injection           . iodixanol (VISIPAQUE) 320 MG/ML injection 200 mL  200 mL Intravenous Once PRN Jolaine Click, MD   65 mL at 2015/10/14 1253  . lidocaine (XYLOCAINE) 1 % (with pres) injection           . midazolam (VERSED) 2 MG/2ML injection           . norepinephrine (LEVOPHED) 4 mg in dextrose 5 % 250 mL (0.016 mg/mL) infusion  2-50 mcg/min Intravenous Continuous Cyril Mourning V, MD      . ondansetron (ZOFRAN) 4 MG/2ML injection           . ondansetron (ZOFRAN) injection 4 mg  4 mg Intravenous Q6H PRN Oretha Milch, MD        ROS:   Unable to obtain due to pt agitation  Physical Examination  Filed Vitals:   2015-10-14 1410 October 14, 2015 1415 14-Oct-2015 1430 2015/10/14 1440  BP: 77/47 121/50 80/46 76/30   Pulse: 95 99  103 97  Temp:      TempSrc:      Resp:   24   Height:      Weight:      SpO2: 100% 100% 100% 100%    Body mass index is 19.92 kg/(m^2).  General:  Distressed wiggling in bed complaining of back pain HEENT: Normal Cardiac: tachycardic 130s Abdomen: Soft, protuberant no obvious mass, right groin flat Skin: No rash Extremity Pulses:  2+ left, right, femoral difficult to palpate due to pain, no dorsalis pedis, posterior tibial pulses bilaterally Musculoskeletal: No deformity or edema  Neurologic: Upper and lower extremity motor 5/5 and symmetric  DATA:    CBC    Component Value Date/Time   WBC 14.5* 10/20/2015 1030   RBC 4.42 10/19/2015 1030   HGB 7.7* 10/24/2015 1439   HCT 23.0* 10/07/2015 1439   PLT 253 10/10/2015 1030   MCV 88.0 10/13/2015 1030   MCH 28.3 10/03/2015 1030   MCHC 32.1 09/28/2015 1030   RDW 15.4 10/08/2015 1030   LYMPHSABS 2.8 09/19/2011 1450   MONOABS 0.9 09/19/2011 1450   EOSABS 0.1 09/19/2011 1450    BASOSABS 0.1 09/19/2011 1450    BMET    Component Value Date/Time   NA 139 10/06/2015 1030   K 4.3 10/10/2015 1030   CL 101 10/26/2015 1030   CO2 26 10/17/2015 1030   GLUCOSE 90 10/10/2015 1030   BUN 22* 10/24/2015 1030   CREATININE 1.03* 10/05/2015 1030   CREATININE 1.07* 08/02/2015 1427   CALCIUM 9.2 10/21/2015 1030   GFRNONAA 51* 09/28/2015 1030   GFRAA 59* 10/16/2015 1030   CT abdomen pelvis images reviewed, non contrast CT right retroperitoneal hematoma  ASSESSMENT:  Retroperitoneal hematoma, currently stabilizing with transfusion, doubt pseudoaneurysm since no real mass in groin. Most likely stick is above the inguinal ligament.   PLAN:  1. Continue supportive care for now.  If she requires 2 more units of blood and remains unstable will explore in OR  Fabienne Brunsharles Theordore Cisnero, MD [facility] [place] Office: [phone] Pager: [phone]

## 2015-09-27 NOTE — Significant Event (Addendum)
Rapid Response Event Note  Overview:  Called to assist with patient with decreased BP s/p right femoral angiogram for possible iliac stent placement  Time Called: 1412 Arrival Time: 1415 Event Type: Hypotension  Initial Focused Assessment: On arrival patient supine in bed - PA Pattricia BossKoreen Morgan and Dr. Bonnielee HaffHoss at bedside.  Patient is alert - cool and clammy - pale - she is oriented - denies SOB or CP - does have some abdominal tenderness on right side with palpatation - states she feels like she needs to void - right femoral site is CDI without hematoma.  IV NS infusing right arm at wide open rate.  RN Liborio NixonJanice is present - states she had sudden decrease in LOC with drop in BP to 40/systolic.  HR remained steady per the O2 sats probe but was not on the heart monitor.  12 lead EKG being done.  Abdomen is distended and mildly firm on initial exam.  12 lead shows ST.  BP 80/54 with NS infusing - one IV right forearm.   Interventions: Post NS liter infusion BP decreased to 64/44 placed on heart monitor HR 93.  RR 24 with O2 sats 96% on 2 liter nasal cannula.  Bil breath sounds = and clear.  Abd becoming more distended - increased hypotension - patient BP pre-op 160-190/systolic.  Patient remains alert - no CP or SOB - continue to have abd discomfort.  Call for stat abd CT scan.  Koreen Chief Financial Officerconsulting PCCM.  2nd IV #20 angio cath to left antecubital times one attempt - NS at wide open - type and cross drawn along with CBC per order with IV start.  BP remains 70/systolic - patient alert.  Dopamine from pyxis - taken to CT scan with Dr. Bonnielee HaffHoss and Corinne PortsKoreen at bedside.  BP 55/34 in CT scan - Dr. Vassie LollAlva and Joneen RoachPaul Hoffman NP present - Dopamine started per order Dr. Vassie LollAlva at 10 mcg/kg.  NS remains wide open.  Patient remains alert.  BP 64/40 - Dopamine increased to 20 mcg/kg per order.  Post CT scan patient vomited moderate amount orange colored with some food particles - airway protected. Now c/o some back pain.  BP 82/44 HR 110 -  transported to 2M13 - becoming more lethargic and weak but remains responsive.  BP 55/44 HR 120 - RR 28 - Zofran 4 mg IV given for vomiting per order.  Dr. Vassie LollAlva at bedside with Dr. Bonnielee HaffHoss.  Emergency blood release started  - BP increased to 88/54 -  handoff to MicrosoftCrystal RN.    Event Summary: Name of Physician Notified: Dr. Lenoria FarrierArt Hoss at  (at bedside)  Name of Consulting Physician Notified: Dr. Vassie LollAlva at 1425  Outcome: Transferred (Comment), Other (Comment) (outpatient IR procedure - admitted to ICU)  Event End Time: 1530  [name] Herman, [name] Herman

## 2015-09-27 NOTE — Code Documentation (Signed)
  Patient Name: [name] Herman   MRN: [id number]   Date of Birth/ Sex: 03/30/1935 , female      Admission Date: 10/01/2015  Attending Provider: Oretha Milchakesh Alva V, MD  Primary Diagnosis: <principal problem not specified>   Indication: Pt was in her usual state of health until this PM, when she was noted to be in PEA. Code blue was subsequently called. At the time of arrival on scene, ACLS protocol was underway.   Technical Description:  - CPR performance duration:  10  minutes  - Was defibrillation or cardioversion used? No   - Was external pacer placed? No  - Was patient intubated pre/post CPR? Yes   Medications Administered: Y = Yes; Blank = No Amiodarone    Atropine  Y  Calcium    Epinephrine  Y  Lidocaine    Magnesium    Norepinephrine    Phenylephrine    Sodium bicarbonate  Y  Vasopressin     Post CPR evaluation:  - Final Status - Was patient successfully resuscitated ? Yes - What is current rhythm? Sinus tachycardia - What is current hemodynamic status? hypovolemic  Miscellaneous Information:  - Labs sent, including: CBC, BMP, Troponin, Lactic acid, PT/INR  - Primary team notified?  Yes  - Family Notified? Yes  - Additional notes/ transfer status:  PCCM to care for further management     Darreld McleanVishal Patel, MD  10/01/2015, 7:11 PM

## 2015-09-27 NOTE — Progress Notes (Signed)
Around 1730  Elink was called and MD made aware of poor BP readings. BP seemed inaccurate with readings going from a MAP of 80 to MAP of 20 in less than 15 minutes with pt extremely fidgety. MD not interested in placing an A-line at this time and is ok as long as pts neuro status is appropriate. Pt was A&Ox4 through this entire episode and I stayed at bedside and continued to monitor. At 1830 I stepped out to find a manual BP cuff. When I returned to bedside pt was unresponsive and Huston FoleyBrady with HR of 45. Pt went Apneic and Code Blue was called. Lorin PicketLindsey Rosmery Duggin, RN

## 2015-09-27 NOTE — Progress Notes (Signed)
CRITICAL VALUE ALERT  Critical value received:  Hg 5.6  Date of notification:  09/28/15  Time of notification:  1950  Critical value read back:Yes.    Nurse who received alert:  Jiles ProwsJ Abigale Dorow  MD notified (1st page):  Joneen RoachPaul Hoffman, NP  Time of first page:  1953  MD notified (2nd page):  Time of second page:  Responding MD:  Joneen RoachPaul Hoffman, NP  Time MD responded:  [phone]

## 2015-09-27 NOTE — Progress Notes (Signed)
   10/11/2015 2000  Clinical Encounter Type  Visited With Family;Health care provider  Visit Type Code  Referral From Physician  Consult/Referral To Chaplain  Spiritual Encounters  Spiritual Needs Emotional;Grief support  Stress Factors  Family Stress Factors Exhausted;Lack of knowledge;Health changes  CH responded to Code Blue; CH met with family and had family wait in 52M waiting area; [place] received verbal authorization from HCPOA(Son) to discuss with pt granddaughter Velna Hatchet, an ICU nurse in [place]); [facility] contacted granddaughter and MD briefed on status of pt; [place] arranged for visitation and made [facility] team aware of family location; [facility] gave RN granddaughter contact info for medical updates; family continues to wait in waiting area; [facility] available if needed additionally. Gwynn Burly 8:40 PM

## 2015-09-27 NOTE — Procedures (Signed)
Procedure note (addendum)  After the diagnostic angiogram, the patient was held on the IR table. At least 30 minutes had transpired since heparin was given. The five french sheath was removed with the intention of holding for an extended period of time. For these reasons, an ACT was not obtained. Pressure was applied to the groin for at least twenty minutes, and hemostasis was achieved. After hemostasis at the groin, the patient began vomiting in the IR suite, and was treated with Zofran. Her groin remained soft without evidence of hematoma. This probably contributed to the retroperitoneal hemorrhage.

## 2015-09-27 NOTE — Procedures (Signed)
RLE angiogram Findings - Irregular right CIA plaque (pressure gradient is 4 mm Hg MAP). CFA/SFA/Pop art patent with one vessel runoff (AT/DP). Comp/EBL - none

## 2015-09-27 NOTE — Progress Notes (Signed)
Pt arrived to unit around 1545 with Rapid Response Nurse and MD. Emergency release blood obtained and rapidly infused. First unit of PRBC unit # [id number] verified by Crystal RN. 2nd unit of PRBC unit # [id number] was verified with Rapid Response nurse and rapidly infused all with MD at bedside. Lorin PicketLindsey Divinity Kyler, RN

## 2015-09-27 NOTE — Progress Notes (Addendum)
Called to evaluate patient who is complaining of nausea and per RN has become hypotensive in the last 15 minutes or so and diaphoretic. She denies any chest pain, lightheadedness or dizziness. She c/o nausea. She does complain of abdominal tenderness with palpation. Dr. Bonnielee HaffHoss has evaluated the patient and we will perform STAT CT abdomen/Pelvis without contrast to evaluate for bleed, STAT H/H, and STAT type and screen. Rapid response has been called.   Pattricia BossKoreen Bryahna Lesko PA-C Interventional Radiology  10/14/2015  2:29 PM

## 2015-09-27 NOTE — Progress Notes (Signed)
Pt hypotensive, diaphoretic, lethargic but arousable, oriented x3. MD notified. Rapid response notified. PA & MD in to evaluate pt.

## 2015-09-28 ENCOUNTER — Inpatient Hospital Stay (HOSPITAL_COMMUNITY): Payer: Commercial Managed Care - HMO

## 2015-09-28 ENCOUNTER — Encounter (HOSPITAL_COMMUNITY): Payer: Self-pay | Admitting: Vascular Surgery

## 2015-09-28 DIAGNOSIS — Z452 Encounter for adjustment and management of vascular access device: Secondary | ICD-10-CM | POA: Insufficient documentation

## 2015-09-28 DIAGNOSIS — S35511A Injury of right iliac artery, initial encounter: Secondary | ICD-10-CM

## 2015-09-28 DIAGNOSIS — M7981 Nontraumatic hematoma of soft tissue: Secondary | ICD-10-CM

## 2015-09-28 DIAGNOSIS — I97418 Intraoperative hemorrhage and hematoma of a circulatory system organ or structure complicating other circulatory system procedure: Secondary | ICD-10-CM

## 2015-09-28 LAB — POCT I-STAT 7, (LYTES, BLD GAS, ICA,H+H)
ACID-BASE DEFICIT: 12 mmol/L — AB (ref 0.0–2.0)
Acid-base deficit: 26 mmol/L — ABNORMAL HIGH (ref 0.0–2.0)
Acid-base deficit: 29 mmol/L — ABNORMAL HIGH (ref 0.0–2.0)
Acid-base deficit: 16 mmol/L — ABNORMAL HIGH (ref 0.0–2.0)
Bicarbonate: 7.1 mEq/L — ABNORMAL LOW (ref 20.0–24.0)
Bicarbonate: 8.4 mEq/L — ABNORMAL LOW (ref 20.0–24.0)
Bicarbonate: 13.8 mEq/L — ABNORMAL LOW (ref 20.0–24.0)
Bicarbonate: 16.7 mEq/L — ABNORMAL LOW (ref 20.0–24.0)
Calcium, Ion: 0.34 mmol/L — CL (ref 1.13–1.30)
Calcium, Ion: 0.4 mmol/L — CL (ref 1.13–1.30)
Calcium, Ion: 0.9 mmol/L — ABNORMAL LOW (ref 1.13–1.30)
Calcium, Ion: 0.92 mmol/L — ABNORMAL LOW (ref 1.13–1.30)
HCT: 26 % — ABNORMAL LOW (ref 36.0–46.0)
HEMATOCRIT: 41 % (ref 36.0–46.0)
HEMATOCRIT: 47 % — AB (ref 36.0–46.0)
HEMATOCRIT: 24 % — AB (ref 36.0–46.0)
HEMOGLOBIN: 13.9 g/dL (ref 12.0–15.0)
HEMOGLOBIN: 8.2 g/dL — AB (ref 12.0–15.0)
Hemoglobin: 16 g/dL — ABNORMAL HIGH (ref 12.0–15.0)
Hemoglobin: 8.8 g/dL — ABNORMAL LOW (ref 12.0–15.0)
O2 SAT: 100 %
O2 Saturation: 100 %
O2 Saturation: 100 %
O2 Saturation: 100 %
PCO2 ART: 45 mmHg (ref 35.0–45.0)
PCO2 ART: 46.4 mmHg — AB (ref 35.0–45.0)
PH ART: 7.147 — AB (ref 7.350–7.450)
POTASSIUM: 4.1 mmol/L (ref 3.5–5.1)
POTASSIUM: 3.8 mmol/L (ref 3.5–5.1)
Patient temperature: 33.9
Patient temperature: 33.4
Patient temperature: 33.7
Potassium: 4.6 mmol/L (ref 3.5–5.1)
Potassium: 4.1 mmol/L (ref 3.5–5.1)
Sodium: 143 mmol/L (ref 135–145)
Sodium: 143 mmol/L (ref 135–145)
Sodium: 147 mmol/L — ABNORMAL HIGH (ref 135–145)
Sodium: 148 mmol/L — ABNORMAL HIGH (ref 135–145)
TCO2: 10 mmol/L (ref 0–100)
TCO2: 9 mmol/L (ref 0–100)
TCO2: 15 mmol/L (ref 0–100)
TCO2: 18 mmol/L (ref 0–100)
pCO2 arterial: 42.2 mmHg (ref 35.0–45.0)
pCO2 arterial: 45.7 mmHg — ABNORMAL HIGH (ref 35.0–45.0)
pH, Arterial: 6.779 — CL (ref 7.350–7.450)
pH, Arterial: 6.842 — CL (ref 7.350–7.450)
pH, Arterial: 7.101 — CL (ref 7.350–7.450)
pO2, Arterial: 366 mmHg — ABNORMAL HIGH (ref 80.0–100.0)
pO2, Arterial: 382 mmHg — ABNORMAL HIGH (ref 80.0–100.0)
pO2, Arterial: 283 mmHg — ABNORMAL HIGH (ref 80.0–100.0)
pO2, Arterial: 366 mmHg — ABNORMAL HIGH (ref 80.0–100.0)

## 2015-09-28 LAB — BASIC METABOLIC PANEL
ANION GAP: 20 — AB (ref 5–15)
ANION GAP: 22 — AB (ref 5–15)
ANION GAP: 19 — AB (ref 5–15)
Anion gap: 19 — ABNORMAL HIGH (ref 5–15)
BUN: 16 mg/dL (ref 6–20)
BUN: 15 mg/dL (ref 6–20)
BUN: 18 mg/dL (ref 6–20)
BUN: 22 mg/dL — AB (ref 6–20)
CALCIUM: 8.1 mg/dL — AB (ref 8.9–10.3)
CALCIUM: 8.2 mg/dL — AB (ref 8.9–10.3)
CALCIUM: 7.3 mg/dL — AB (ref 8.9–10.3)
CO2: 13 mmol/L — ABNORMAL LOW (ref 22–32)
CO2: 20 mmol/L — ABNORMAL LOW (ref 22–32)
CO2: 18 mmol/L — ABNORMAL LOW (ref 22–32)
CO2: 19 mmol/L — ABNORMAL LOW (ref 22–32)
CREATININE: 1.37 mg/dL — AB (ref 0.44–1.00)
Calcium: 7.5 mg/dL — ABNORMAL LOW (ref 8.9–10.3)
Chloride: 114 mmol/L — ABNORMAL HIGH (ref 101–111)
Chloride: 103 mmol/L (ref 101–111)
Chloride: 113 mmol/L — ABNORMAL HIGH (ref 101–111)
Chloride: 112 mmol/L — ABNORMAL HIGH (ref 101–111)
Creatinine, Ser: 1.59 mg/dL — ABNORMAL HIGH (ref 0.44–1.00)
Creatinine, Ser: 2.06 mg/dL — ABNORMAL HIGH (ref 0.44–1.00)
Creatinine, Ser: 2.47 mg/dL — ABNORMAL HIGH (ref 0.44–1.00)
GFR calc Af Amer: 42 mL/min — ABNORMAL LOW (ref >60)
GFR calc Af Amer: 35 mL/min — ABNORMAL LOW (ref >60)
GFR calc Af Amer: 25 mL/min — ABNORMAL LOW (ref >60)
GFR calc Af Amer: 20 mL/min — ABNORMAL LOW (ref >60)
GFR calc non Af Amer: 22 mL/min — ABNORMAL LOW (ref >60)
GFR, EST NON AFRICAN AMERICAN: 36 mL/min — AB (ref >60)
GFR, EST NON AFRICAN AMERICAN: 30 mL/min — AB (ref >60)
GFR, EST NON AFRICAN AMERICAN: 18 mL/min — AB (ref >60)
GLUCOSE: 155 mg/dL — AB (ref 65–99)
Glucose, Bld: 172 mg/dL — ABNORMAL HIGH (ref 65–99)
Glucose, Bld: 122 mg/dL — ABNORMAL HIGH (ref 65–99)
Glucose, Bld: 137 mg/dL — ABNORMAL HIGH (ref 65–99)
POTASSIUM: 3.5 mmol/L (ref 3.5–5.1)
POTASSIUM: 3.5 mmol/L (ref 3.5–5.1)
Potassium: 4.8 mmol/L (ref 3.5–5.1)
Potassium: 3.8 mmol/L (ref 3.5–5.1)
SODIUM: 145 mmol/L (ref 135–145)
SODIUM: 150 mmol/L — AB (ref 135–145)
Sodium: 147 mmol/L — ABNORMAL HIGH (ref 135–145)
Sodium: 150 mmol/L — ABNORMAL HIGH (ref 135–145)

## 2015-09-28 LAB — PROTIME-INR
INR: 1.67 — AB (ref 0.00–1.49)
INR: 1.72 — AB (ref 0.00–1.49)
INR: 1.66 — ABNORMAL HIGH (ref 0.00–1.49)
PROTHROMBIN TIME: 19.7 s — AB (ref 11.6–15.2)
PROTHROMBIN TIME: 20.2 s — AB (ref 11.6–15.2)
Prothrombin Time: 19.6 seconds — ABNORMAL HIGH (ref 11.6–15.2)

## 2015-09-28 LAB — PREPARE FRESH FROZEN PLASMA
UNIT DIVISION: 0
UNIT DIVISION: 0
Unit division: 0
Unit division: 0

## 2015-09-28 LAB — POCT I-STAT 3, ART BLOOD GAS (G3+)
ACID-BASE DEFICIT: 14 mmol/L — AB (ref 0.0–2.0)
Acid-base deficit: 1 mmol/L (ref 0.0–2.0)
Bicarbonate: 13.9 mEq/L — ABNORMAL LOW (ref 20.0–24.0)
Bicarbonate: 20.4 mEq/L (ref 20.0–24.0)
O2 SAT: 99 %
O2 SAT: 99 %
PCO2 ART: 22.8 mmHg — AB (ref 35.0–45.0)
PH ART: 7.17 — AB (ref 7.350–7.450)
PH ART: 7.56 — AB (ref 7.350–7.450)
PO2 ART: 162 mmHg — AB (ref 80.0–100.0)
TCO2: 15 mmol/L (ref 0–100)
TCO2: 21 mmol/L (ref 0–100)
pCO2 arterial: 37.2 mmHg (ref 35.0–45.0)
pO2, Arterial: 135 mmHg — ABNORMAL HIGH (ref 80.0–100.0)

## 2015-09-28 LAB — CBC
HCT: 10.2 % — ABNORMAL LOW (ref 36.0–46.0)
HCT: 11 % — ABNORMAL LOW (ref 36.0–46.0)
HCT: 31.1 % — ABNORMAL LOW (ref 36.0–46.0)
HEMATOCRIT: 26.8 % — AB (ref 36.0–46.0)
HEMOGLOBIN: 8.9 g/dL — AB (ref 12.0–15.0)
Hemoglobin: 10.3 g/dL — ABNORMAL LOW (ref 12.0–15.0)
Hemoglobin: 3.5 g/dL — CL (ref 12.0–15.0)
Hemoglobin: 3.7 g/dL — CL (ref 12.0–15.0)
MCH: 28 pg (ref 26.0–34.0)
MCH: 28.4 pg (ref 26.0–34.0)
MCH: 28.7 pg (ref 26.0–34.0)
MCH: 29.1 pg (ref 26.0–34.0)
MCHC: 33.1 g/dL (ref 30.0–36.0)
MCHC: 33.2 g/dL (ref 30.0–36.0)
MCHC: 33.6 g/dL (ref 30.0–36.0)
MCHC: 34.3 g/dL (ref 30.0–36.0)
MCV: 83.3 fL (ref 78.0–100.0)
MCV: 83.6 fL (ref 78.0–100.0)
MCV: 85.6 fL (ref 78.0–100.0)
MCV: 87.9 fL (ref 78.0–100.0)
PLATELETS: 27 10*3/uL — AB (ref 150–400)
PLATELETS: 40 10*3/uL — AB (ref 150–400)
PLATELETS: 21 10*3/uL — AB (ref 150–400)
Platelets: 40 10*3/uL — ABNORMAL LOW (ref 150–400)
RBC: 1.22 MIL/uL — ABNORMAL LOW (ref 3.87–5.11)
RBC: 1.32 MIL/uL — AB (ref 3.87–5.11)
RBC: 3.13 MIL/uL — ABNORMAL LOW (ref 3.87–5.11)
RBC: 3.54 MIL/uL — ABNORMAL LOW (ref 3.87–5.11)
RDW: 15.5 % (ref 11.5–15.5)
RDW: 15.7 % — AB (ref 11.5–15.5)
RDW: 15.8 % — AB (ref 11.5–15.5)
RDW: 16.1 % — AB (ref 11.5–15.5)
WBC: 17.7 10*3/uL — AB (ref 4.0–10.5)
WBC: 18.4 10*3/uL — AB (ref 4.0–10.5)
WBC: 8.8 10*3/uL (ref 4.0–10.5)
WBC: 9.3 10*3/uL (ref 4.0–10.5)

## 2015-09-28 LAB — TROPONIN I
TROPONIN I: 0.41 ng/mL — AB (ref <0.031)
TROPONIN I: 0.34 ng/mL — AB (ref <0.031)
TROPONIN I: 0.61 ng/mL — AB (ref <0.031)

## 2015-09-28 LAB — HEMOGLOBIN AND HEMATOCRIT, BLOOD
HCT: 11.1 % — ABNORMAL LOW (ref 36.0–46.0)
HCT: 23.7 % — ABNORMAL LOW (ref 36.0–46.0)
HEMATOCRIT: 26.8 % — AB (ref 36.0–46.0)
HEMOGLOBIN: 8.8 g/dL — AB (ref 12.0–15.0)
HEMOGLOBIN: 3.7 g/dL — AB (ref 12.0–15.0)
HEMOGLOBIN: 8.5 g/dL — AB (ref 12.0–15.0)

## 2015-09-28 LAB — PREPARE RBC (CROSSMATCH)

## 2015-09-28 LAB — DIC (DISSEMINATED INTRAVASCULAR COAGULATION) PANEL
D DIMER QUANT: 16.47 ug{FEU}/mL — AB (ref 0.00–0.48)
FIBRINOGEN: 75 mg/dL — AB (ref 204–475)
SMEAR REVIEW: NONE SEEN

## 2015-09-28 LAB — MAGNESIUM: Magnesium: 1.9 mg/dL (ref 1.7–2.4)

## 2015-09-28 LAB — DIC (DISSEMINATED INTRAVASCULAR COAGULATION)PANEL
INR: 3.3 — ABNORMAL HIGH (ref 0.00–1.49)
Platelets: 42 10*3/uL — ABNORMAL LOW (ref 150–400)
Prothrombin Time: 32.9 seconds — ABNORMAL HIGH (ref 11.6–15.2)
aPTT: 118 seconds — ABNORMAL HIGH (ref 24–37)

## 2015-09-28 LAB — CK: Total CK: 1781 U/L — ABNORMAL HIGH (ref 38–234)

## 2015-09-28 LAB — GLUCOSE, CAPILLARY: Glucose-Capillary: 275 mg/dL — ABNORMAL HIGH (ref 65–99)

## 2015-09-28 LAB — BLOOD PRODUCT ORDER (VERBAL) VERIFICATION

## 2015-09-28 LAB — PATHOLOGIST SMEAR REVIEW

## 2015-09-28 LAB — LACTIC ACID, PLASMA
LACTIC ACID, VENOUS: 15.2 mmol/L — AB (ref 0.5–2.0)
Lactic Acid, Venous: 14.3 mmol/L (ref 0.5–2.0)

## 2015-09-28 LAB — FIBRINOGEN: FIBRINOGEN: 390 mg/dL (ref 204–475)

## 2015-09-28 LAB — PHOSPHORUS: PHOSPHORUS: 6.2 mg/dL — AB (ref 2.5–4.6)

## 2015-09-28 MED ORDER — DEXTROSE 5 % IV SOLN
5.0000 mg/h | INTRAVENOUS | Status: DC
  Administered 2015-09-28: 5 mg/h via INTRAVENOUS
  Filled 2015-09-28 (×3): qty 25

## 2015-09-28 MED ORDER — ANTISEPTIC ORAL RINSE SOLUTION (CORINZ)
7.0000 mL | Freq: Four times a day (QID) | OROMUCOSAL | Status: DC
  Administered 2015-09-28 – 2015-10-12 (×54): 7 mL via OROMUCOSAL

## 2015-09-28 MED ORDER — MAGNESIUM SULFATE 2 GM/50ML IV SOLN
2.0000 g | Freq: Once | INTRAVENOUS | Status: AC
  Administered 2015-09-28: 2 g via INTRAVENOUS
  Filled 2015-09-28: qty 50

## 2015-09-28 MED ORDER — SODIUM BICARBONATE 8.4 % IV SOLN
100.0000 meq | Freq: Once | INTRAVENOUS | Status: AC
  Administered 2015-09-28: 100 meq via INTRAVENOUS

## 2015-09-28 MED ORDER — DEXTROSE 5 % IV SOLN
INTRAVENOUS | Status: DC
  Administered 2015-09-28 – 2015-09-30 (×2): via INTRAVENOUS

## 2015-09-28 MED ORDER — SODIUM BICARBONATE 8.4 % IV SOLN: 100.0000 meq | Freq: Once | INTRAVENOUS | Status: DC

## 2015-09-28 MED ORDER — DEXTROSE 5 % IV SOLN
120.0000 mg | Freq: Once | INTRAVENOUS | Status: AC
  Administered 2015-09-28: 120 mg via INTRAVENOUS
  Filled 2015-09-28 (×2): qty 12

## 2015-09-28 MED ORDER — SODIUM CHLORIDE 0.9 % IV SOLN: Freq: Once | INTRAVENOUS | Status: AC

## 2015-09-28 MED ORDER — SODIUM CHLORIDE 0.9 % IV SOLN: Freq: Once | INTRAVENOUS | Status: DC

## 2015-09-28 MED ORDER — POTASSIUM CHLORIDE 10 MEQ/100ML IV SOLN
10.0000 meq | Freq: Once | INTRAVENOUS | Status: AC
  Administered 2015-09-28: 10 meq via INTRAVENOUS
  Filled 2015-09-28: qty 100

## 2015-09-28 MED ORDER — SODIUM CHLORIDE 0.9 % IV SOLN
8.0000 mg/h | INTRAVENOUS | Status: AC
  Administered 2015-09-28 – 2015-10-01 (×6): 8 mg/h via INTRAVENOUS
  Filled 2015-09-28 (×14): qty 80

## 2015-09-28 MED ORDER — CHLORHEXIDINE GLUCONATE 0.12 % MT SOLN
15.0000 mL | Freq: Two times a day (BID) | OROMUCOSAL | Status: DC
  Administered 2015-09-28: 15 mL via OROMUCOSAL

## 2015-09-28 MED ORDER — IOHEXOL 350 MG/ML SOLN
100.0000 mL | Freq: Once | INTRAVENOUS | Status: AC | PRN
  Administered 2015-09-27: 100 mL via INTRAVENOUS

## 2015-09-28 MED ORDER — WHITE PETROLATUM GEL
Status: AC
  Administered 2015-09-28: 0.2
  Filled 2015-09-28: qty 1

## 2015-09-28 MED ORDER — SODIUM CHLORIDE 0.9 % IV SOLN
Freq: Once | INTRAVENOUS | Status: AC
  Administered 2015-09-28: 01:00:00 via INTRAVENOUS

## 2015-09-28 MED ORDER — PANTOPRAZOLE SODIUM 40 MG IV SOLR
40.0000 mg | Freq: Once | INTRAVENOUS | Status: AC
  Administered 2015-09-28: 40 mg via INTRAVENOUS
  Filled 2015-09-28: qty 40

## 2015-09-28 MED ORDER — FUROSEMIDE 10 MG/ML IJ SOLN
60.0000 mg | Freq: Four times a day (QID) | INTRAMUSCULAR | Status: DC
Start: 2015-09-28 — End: 2015-09-28

## 2015-09-28 MED ORDER — SODIUM CHLORIDE 0.9 % IV SOLN
80.0000 mg | Freq: Once | INTRAVENOUS | Status: DC
  Filled 2015-09-28: qty 80

## 2015-09-28 MED ORDER — PANTOPRAZOLE SODIUM 40 MG IV SOLR
40.0000 mg | Freq: Two times a day (BID) | INTRAVENOUS | Status: DC
  Administered 2015-10-01 – 2015-10-09 (×16): 40 mg via INTRAVENOUS
  Filled 2015-09-28 (×18): qty 40

## 2015-09-28 MED ORDER — CETYLPYRIDINIUM CHLORIDE 0.05 % MT LIQD
7.0000 mL | Freq: Two times a day (BID) | OROMUCOSAL | Status: DC
  Administered 2015-09-28: 7 mL via OROMUCOSAL

## 2015-09-28 MED ORDER — CHLORHEXIDINE GLUCONATE 0.12% ORAL RINSE (MEDLINE KIT)
15.0000 mL | Freq: Two times a day (BID) | OROMUCOSAL | Status: DC
  Administered 2015-09-29 – 2015-10-12 (×27): 15 mL via OROMUCOSAL

## 2015-09-28 MED ORDER — SODIUM BICARBONATE 8.4 % IV SOLN
INTRAVENOUS | Status: AC
  Administered 2015-09-28: 100 meq via INTRAVENOUS
  Filled 2015-09-28: qty 100

## 2015-09-28 MED FILL — Medication: Qty: 1 | Status: AC

## 2015-09-28 NOTE — Progress Notes (Signed)
eLink Physician-Brief Progress Note Patient Name: [name] FlamingMartha H Herman DOB: 03/11/1935 MRN: [id number]   Date of Service  09/28/2015  HPI/Events of Note  Patient with metabolic acidosis pH 7.17/37/162/13.9  eICU Interventions  Plan: Increase RR on vent to 25 2 amps of bicarb IVP Recheck and trend lactate Recheck ABG in 1 hour post vent change     Intervention Category Major Interventions: Acid-Base disturbance - evaluation and management  Uzma Hellmer 09/28/2015, 1:25 AM

## 2015-09-28 NOTE — Progress Notes (Signed)
Released 2 units FFP but only one unit crossed over to blood admin flow sheet.  Unit [id number] B Positive exp 03 Oct 2015. Started [id number] ended [phone]

## 2015-09-28 NOTE — Progress Notes (Signed)
Inpatient Diabetes Program Recommendations  AACE/ADA: New Consensus Statement on Inpatient Glycemic Control (2015)  Target Ranges:  Prepandial:   less than 140 mg/dL      Peak postprandial:   less than 180 mg/dL (1-2 hours)      Critically ill patients:  140 - 180 mg/dL  Results for Orma FlamingROYSTER, Bobetta H (MRN [id number]) as of 09/28/2015 10:03  Ref. Range 10/06/2015 13:56 10/11/2015 16:18  Glucose-Capillary Latest Ref Range: 65-99 mg/dL 409194 (H) 811171 (H)   Review of Glycemic Control  Diabetes history: No Outpatient Diabetes medications: None Current orders for Inpatient glycemic control: None  Inpatient Diabetes Program Recommendations: Correction (SSI): Patient is ordered Solucortef 50 mg Q6H which is likely cause of hyperglycemia. While inpatient and ordered steroids,please consider ordering CBGs with Novolog sensitive correction scale Q4H.  Thanks, Orlando PennerMarie Riham Polyakov, RN, MSN, CDE Diabetes Coordinator Inpatient Diabetes Program [phone] (Team Pager from 8am to 5pm) [phone] ([facility] office) [phone] [facility] office) [phone] [facility] office)

## 2015-09-28 NOTE — Progress Notes (Signed)
Had a family meeting with 3 adult children. Decided at this time the patient would be a DO NOT RESUSCITATE. Will continue to keep on ventilator for now.   Joanna Puffrystal S. Dorsey, MD [facility] Resident  09/28/2015, 10:32 AM

## 2015-09-28 NOTE — Progress Notes (Signed)
[facility] [place]  Subjective  - Pt still with hemodynamic responsive shock   Objective 119/75 83 97.3 F (36.3 C) (Oral) 25 100%  Intake/Output Summary (Last 24 hours) at 09/28/15 0558 Last data filed at 09/28/15 0515  Gross per 24 hour  Intake 10870.88 ml  Output      2 ml  Net 10868.88 ml   Minimal urine output Abdomen is firm but not as distended Bleeding from NG tube  Assessment/Planning: DIC coagulapathy.  Needs aggressive correction of all coag parameters.  She probably has ongoing diffuse retroperitoneal ooze which hopefully will stop with correction of coags.  If she continues to be unstable after transfusion and correction of coagulapathy will consider re-exploration but not in her current state  Will most likely develop ATN but hopefully improve kidney function with volume  Platelets ordered in addition to FFP and blood  [name], Charles 09/28/2015 5:58 AM --  Laboratory Lab Results:  Recent Labs  09/28/15 0114 09/28/15 0330  WBC 18.4* 8.8  HGB 8.9* 3.7*  HCT 26.8* 11.0*  PLT 40* 21*   BMET  Recent Labs  09/28/15 0114 09/28/15 0330  NA 147* 145  K 4.8 3.5  CL 114* 103  CO2 13* 20*  GLUCOSE 172* 155*  BUN 16 15  CREATININE 1.37* 1.59*  CALCIUM 8.1* 8.2*    COAG Lab Results  Component Value Date   INR 1.67* 09/28/2015   INR 3.30* 29-Apr-2015   INR 2.07* 29-Apr-2015   No results found for: PTT

## 2015-09-28 NOTE — Progress Notes (Addendum)
HGB of 4.3 called to Dr Dederding. Also informed Dr Darrick PennaFields

## 2015-09-28 NOTE — Progress Notes (Signed)
Patient ID: [name], female   DOB: 05/05/1935, 80 y.o.   MRN: [id number]   [name] Herman is still in hemodynamic shock. She is intubated and on the vent. She had a bloody stool this morning. She was taken to the OR by Dr. Darrick PennaFields for retroperitoneal hemorrhage and sewing of arterial puncture site. Currently, BP is 118/61 (no pressors), pulse 122, temp 97.3. UOP since 0600 is 40cc. Last Hb is 10.4, PLT are low, INR 1.7. BUN/Cr are 15/1.59. She is being aggressively resuscitated. IR will follow.

## 2015-09-28 NOTE — Progress Notes (Signed)
Kyleah P[place]abene [place] Admit Date: 2015-10-20 09/28/2015 Arita Miss Requesting Physician:  Tyson Alias MD  Reason for Consult:  AKI HPI:  66F seen at request of Dr. Tyson Alias for evaluation of AKI.  Patient underwent lower arterial angiogram for right lower chimney gangrene. She unfortunately developed a retroperitoneal hematoma, hemorrhagic shock, and a PEA arrest.  She required surgical correction of the external iliac artery, evacuation of the retroperitoneal hematoma, and exploratory laparotomy. She required massive transfusion and had a hemoglobin as low as 3.5. She now has DIC. She remains intubated with 40% FiO2. She is not currently on any vasopressors. She required a CT of the abdomen and pelvis with IV contrast in addition to the contrast load during the angiogram.  She has developed anuric acute kidney injury. Foley catheter is in place.   CREAT (mg/dL)  Date Value  32/44/0102 1.07*   CREATININE, SER (mg/dL)  Date Value  72/53/6644 2.06*  09/28/2015 1.59*  09/28/2015 1.37*  2015/10/20 1.22*  20-Oct-2015 1.16*  2015-10-20 1.10*  10-20-2015 1.03*  11/01/2014 0.9  09/19/2011 0.9  08/18/2011 0.85  ] I/Os: I/O last 3 completed shifts: In: 12223.8 [I.V.:5612.8; IHKVQ:2595; IV Piggyback:50] Out: 512 [Urine:32; Emesis/NG output:480]   ROS NSAIDS: None TMP/SMX none Balance of 12 systems is negative w/ exceptions as above  PMH  Past Medical History  Diagnosis Date  . ANEMIA 09/25/2010  . DEPRESSION 12/02/2009  . ESSENTIAL HYPERTENSION 02/10/2010  . ASTHMA 12/02/2009  . COPD 10/03/2010  . PEPTIC ULCER DISEASE 10/23/2010  . NEPHROLITHIASIS 12/02/2009  . OSTEOARTHRITIS 01/27/2010  . Pyloric stenosis   . Peptic ulcer, unspecified site, unspecified as acute or chronic, without mention of hemorrhage, perforation, or obstruction   . Gastritis    PSH  Past Surgical History  Procedure Laterality Date  . Abdominal hysterectomy  1970  . Back surgery  1984  . Kidney surgery  1970   . Hematoma evacuation Right 10-20-15    Procedure: EVACUATION RIGHT RETROPERITONEAL HEMATOMA;  Surgeon: Sherren Kerns, MD;  Location: [facility] OR;  Service: Vascular;  Laterality: Right;  . Repair iliac artery Right 2015-10-20    Procedure: REPAIR RIGHT EXTERNAL ILIAC ARTERY;  Surgeon: Sherren Kerns, MD;  Location: [facility] OR;  Service: Vascular;  Laterality: Right;   FH  Family History  Problem Relation Age of Onset  . Prostate cancer Father     prostate  . Colon cancer Neg Hx    SH  reports that she has never smoked. She has never used smokeless tobacco. She reports that she does not drink alcohol or use illicit drugs. Allergies  Allergies  Allergen Reactions  . Meloxicam     GI upset  . Meperidine Hcl     GI upset   Home medications Prior to Admission medications   Medication Sig Start Date End Date Taking? Authorizing Provider  albuterol (PROVENTIL HFA;VENTOLIN HFA) 108 (90 BASE) MCG/ACT inhaler Inhale 2 puffs into the lungs every 4 (four) hours as needed for wheezing. 06/22/15 04/18/17 Yes Kristian Covey, MD  aspirin EC 81 MG tablet Take 81 mg by mouth daily.   Yes Historical Provider, MD  atorvastatin (LIPITOR) 10 MG tablet Take 1 tablet (10 mg total) by mouth daily. 08/25/15  Yes Kristian Covey, MD  Biotin 1000 MCG tablet Take 1,000 mcg by mouth daily.   Yes Historical Provider, MD  Calcium Carb-Cholecalciferol (CALCIUM 1000 + D PO) Take 1 tablet by mouth daily.    Yes Historical Provider, MD  clopidogrel (PLAVIX) 75  MG tablet Take 1 tablet (75 mg total) by mouth daily. 08/22/15  Yes Kristian Covey, MD  cyanocobalamin 500 MCG tablet Take 500 mcg by mouth daily.   Yes Historical Provider, MD  latanoprost (XALATAN) 0.005 % ophthalmic solution Place 1 drop into the right eye at bedtime.     Yes Historical Provider, MD  mometasone [facility]) 220 MCG/INH inhaler Inhale 2 puffs into the lungs daily. 01/05/15  Yes Kristian Covey, MD  Multiple Vitamins-Minerals (OCUVITE PO) Take by  mouth.   Yes Historical Provider, MD  oxyCODONE-acetaminophen (PERCOCET) 10-325 MG per tablet Take 1 tablet by mouth every 6 (six) hours as needed for pain. 07/28/15 07/27/16 Yes Kristian Covey, MD  pantoprazole (PROTONIX) 40 MG tablet TAKE 1 TABLET (40 MG TOTAL) BY MOUTH DAILY. 07/14/15  Yes Kristian Covey, MD  predniSONE (DELTASONE) 10 MG tablet Take 10 mg by mouth daily with breakfast.   Yes Historical Provider, MD  sertraline (ZOLOFT) 100 MG tablet Take 0.5 tablets (50 mg total) by mouth daily. 07/14/15  Yes Kristian Covey, MD  triamterene-hydrochlorothiazide (DYAZIDE) 37.5-25 MG capsule TAKE ONE CAPSULE BY MOUTH EVERY MORNING 08/29/15  Yes Kristian Covey, MD    Current Medications Scheduled Meds: . sodium chloride   Intravenous Once  . sodium chloride   Intravenous Once  . sodium chloride   Intravenous Once  . antiseptic oral rinse  7 mL Mouth Rinse q12n4p  . budesonide  0.25 mg Nebulization Q12H  . chlorhexidine  15 mL Mouth Rinse BID  . fentaNYL (SUBLIMAZE) injection  50 mcg Intravenous Once  . hydrocortisone sod succinate (SOLU-CORTEF) inj  50 mg Intravenous Q6H  . ipratropium-albuterol  3 mL Nebulization Q4H  . pantoprazole (PROTONIX) IV  80 mg Intravenous Once  . [START ON 10/01/2015] pantoprazole (PROTONIX) IV  40 mg Intravenous Q12H   Continuous Infusions: . sodium chloride Stopped (09/28/15 1457)  . dextrose 50 mL/hr at 09/28/15 1458  . fentaNYL infusion INTRAVENOUS 50 mcg/hr (09/28/15 1400)  . furosemide (LASIX) infusion 5 mg/hr (09/28/15 1037)  . norepinephrine (LEVOPHED) Adult infusion Stopped (09/28/15 0520)  . pantoprozole (PROTONIX) infusion 8 mg/hr (09/28/15 1350)   PRN Meds:.sodium chloride, fentaNYL, fentaNYL (SUBLIMAZE) injection, iodixanol, ondansetron (ZOFRAN) IV  CBC  Recent Labs Lab 10/13/15 1640 10-13-2015 1900  09/28/15 0330 09/28/15 0451 09/28/15 0842  WBC 27.1* 13.8*  < > 8.8 9.3 8.4  NEUTROABS 21.9* 10.6*  --   --   --   --   HGB 12.7 5.6*   < > 3.7* 3.7*  3.5* 10.4*  HCT 37.2 17.0*  < > 11.0* 11.1*  10.2* 29.9*  MCV 88.6 89.5  < > 83.3 83.6 91.2  PLT 216 115*  < > 21* 27* 99*  < > = values in this interval not displayed. Basic Metabolic Panel  Recent Labs Lab 10-13-2015 1030 Oct 13, 2015 1640 2015/10/13 1900 10-13-2015 1950 10/13/2015 2146 10-13-15 2155 13-Oct-2015 2236 10-13-15 2325 09/28/15 0114 09/28/15 0330 09/28/15 0950  NA 139 139 152* 145 143 143 147* 148* 147* 145 150*  K 4.3 3.6 4.9 4.7 4.6 4.1 3.8 4.1 4.8 3.5 3.8  CL 101 110 107 101  --   --   --   --  114* 103 113*  CO2 26 16* 24 24  --   --   --   --  13* 20* 18*  GLUCOSE 90 208* 292* 292*  --   --   --   --  172* 155* 122*  BUN 22* 20 18 18   --   --   --   --  16 15 18   CREATININE 1.03* 1.10* 1.16* 1.22*  --   --   --   --  1.37* 1.59* 2.06*  CALCIUM 9.2 7.2* 6.0* 5.8*  --   --   --   --  8.1* 8.2* 7.5*  PHOS  --  4.0 6.8*  --   --   --   --   --   --   --   --     Physical Exam  Blood pressure 100/49, pulse 97, temperature 97.8 F (36.6 C), temperature source Oral, resp. rate 22, height 5' (1.524 m), weight 46.267 kg (102 lb), SpO2 98 %. GEN: Intubated and sedated ENT: ET tube in place .  Left neck with Cordis catheter EYES: Eyes closed CV: RRR PULM: Coarse bilaterally ABD: Midline surgical wound bandaged, soft SKIN: Ischemic changes in the distal right lower extremity noted   Assessment/Plan 18F w/ anuric ATN 2/2 hemorrhagic shock, PEA arrest, and contrast nephropathy  1. Anuric AKI 2/2 ATN 1. Baseline SCr ~1.1 2. Foley catheter in place; CT A/P 11/1 w unremarkable kidney appearance 3. No strong indications for CRRT at current time; discussed with family, remains very possible 4. Cont to follow closely Daily weights, Daily Renal Panel, Strict I/Os, Avoid nephrotoxins (NSAIDs, judicious IV Contrast) On lasix gtt currently, trial of 120mg  IVB to see if responsive 1. Hemorrhagic Shock: off pressors, Hb up to 10 2. VDRF per CCM 3. RP Hematoma s/p  evaculation and arterial repair 4. PAD 5. HTN off meds, not on ACEi as outpt  Sabra Heckyan Tawn Fitzner MD [phone] pgr 09/28/2015, 3:35 PM

## 2015-09-28 NOTE — H&P (Signed)
PULMONARY / CRITICAL CARE MEDICINE   Name: [name] Herman MRN: [id number] DOB: 10-16-1935    ADMISSION DATE:  10/03/2015 CONSULTATION DATE:  October 12, 2015  REFERRING MD :  Dr. Bonnielee Haff, IR  CHIEF COMPLAINT:  RP bleeding after IR procedure  INITIAL PRESENTATION: 80 year old female who presented to [facility] 10/23/2015 for iliac artery stent placement. Procedure was done, no stent needed. Post-operatively she complained of nausea and had hypotension. Distended abdomen. CT with with retroperitoneal blood, not actively bleeding per Dr. Bonnielee Haff. To ICU for PCCM management.   STUDIES:  CT (A) abd/pelvis 10-12-2023) >>> Lower extremity arterial duplex 2023-10-12)   SIGNIFICANT EVENTS: 10-12-23 to IR for iliac stent, stent not needed, post op RP blood. Shock. To ICU 10/12/23 coded pea 10 min , OR for evacuation hematoma and iliac repair  11/2- GI bleeding  SUBJECTIVE: no pressors  VITAL SIGNS: Temp:  [93.9 F (34.4 C)-98.3 F (36.8 C)] 98 F (36.7 C) (11/02 0802) Pulse Rate:  [33-228] 122 (11/02 0802) Resp:  [0-42] 25 (11/02 0802) BP: (36-187)/(15-113) 119/75 mmHg 2023-10-12 2040) SpO2:  [78 %-100 %] 100 % (11/02 0802) Arterial Line BP: (51-200)/(18-188) 118/61 mmHg (11/02 0802) FiO2 (%):  [80 %-100 %] 80 % (11/02 0801) HEMODYNAMICS:   VENTILATOR SETTINGS: Vent Mode:  [-] PRVC FiO2 (%):  [80 %-100 %] 80 % Set Rate:  [14 bmp-25 bmp] 25 bmp Vt Set:  [400 mL] 400 mL PEEP:  [5 cmH20] 5 cmH20 Plateau Pressure:  [33 cmH20-37 cmH20] 37 cmH20 INTAKE / OUTPUT:  Intake/Output Summary (Last 24 hours) at 09/28/15 4098 Last data filed at 09/28/15 0802  Gross per 24 hour  Intake 12553.33 ml  Output    722 ml  Net 11831.33 ml    PHYSICAL EXAMINATION: General:  Vented, sedated Neuro:  Awakens, opens eyes, moves all ext equally HEENT: jvd low Cardiovascular:  Tachy, regular no MRG Lungs: coarse Abdomen:  Firm , hypoactive, no r/g Musculoskeletal:  No acute deformity or ROM limitation, no  edema, rt dopplerable, left palpable Skin:  Necrotic appearing R toes.   LABS:  CBC  Recent Labs Lab 09/28/15 0330 09/28/15 0451 09/28/15 0842  WBC 8.8 9.3 8.4  HGB 3.7* 3.7*  3.5* 10.4*  HCT 11.0* 11.1*  10.2* 29.9*  PLT 21* 27* PENDING   Coag's  Recent Labs Lab Oct 12, 2015 1030 Oct 12, 2015 1900 10/25/2015 2324 09/28/15 0451 09/28/15 0842  APTT 21* 56* 118*  --   --   INR 0.96 2.07* 3.30* 1.67* 1.72*   BMET  Recent Labs Lab 09/28/2015 1950  10/09/2015 2325 09/28/15 0114 09/28/15 0330  NA 145  < > 148* 147* 145  K 4.7  < > 4.1 4.8 3.5  CL 101  --   --  114* 103  CO2 24  --   --  13* 20*  BUN 18  --   --  16 15  CREATININE 1.22*  --   --  1.37* 1.59*  GLUCOSE 292*  --   --  172* 155*  < > = values in this interval not displayed. Electrolytes  Recent Labs Lab 10/13/2015 1640 10/07/2015 1900 10/19/2015 1950 09/28/15 0114 09/28/15 0330  CALCIUM 7.2* 6.0* 5.8* 8.1* 8.2*  MG 1.3* 1.4*  --   --   --   PHOS 4.0 6.8*  --   --   --    Sepsis Markers  Recent Labs Lab Oct 12, 2015 1900 09/28/15 0121 09/28/15 0410  LATICACIDVEN 16.5* 14.3* 15.2*   ABG  Recent Labs Lab 10-19-2015 2236 10-19-2015 2325 09/28/15 0107  PHART 7.147* 7.101* 7.170*  PCO2ART 45.7* 42.2 37.2  PO2ART 366.0* 283.0* 162.0*   Liver Enzymes  Recent Labs Lab 10-19-2015 1640 10/19/2015 1950  AST 27 123*  ALT 13* 121*  ALKPHOS 41 24*  BILITOT 0.7 0.5  ALBUMIN 2.6* 1.0*   Cardiac Enzymes  Recent Labs Lab 09/28/15 0113 09/28/15 0330 09/28/15 0731  TROPONINI 0.41* 0.34* 0.61*   Glucose  Recent Labs Lab Oct 19, 2015 1356 10/19/2015 1618  GLUCAP 194* 171*    Imaging Dg Chest Port 1 View  October 19, 2015  CLINICAL DATA:  Code blue.  Endotracheal tube placement. EXAM: PORTABLE CHEST 1 VIEW COMPARISON:  02/09/2015 FINDINGS: Endotracheal tube placed with tip measuring 2.6 cm above the carina. There appears to be a central vascular sheath over the left jugular vein with tip projected over the upper  mediastinum. This could be localized in the brachiocephalic vein or arterial structure. Normal heart size and pulmonary vascularity. Shallow inspiration. Slight atelectasis in the left lung base. No focal consolidation. No pneumothorax. No blunting of costophrenic angles. Acutea right and left lower rib fractures. IMPRESSION: Appliances positioned as described. Shallow inspiration. Acute rib fractures. No pneumothorax or focal consolidation in the lungs. Electronically Signed   By: Burman Nieves M.D.   On: October 19, 2015 20:18   Dg Chest Port 1 View  10/19/15  CLINICAL DATA:  Bedside central venous catheter placement. Current history of blue toe syndrome EXAM: PORTABLE CHEST 1 VIEW COMPARISON:  02/09/2015 and earlier. FINDINGS: Left jugular introducer sheath tip projects over the left innominate vein. No evidence of pneumothorax or mediastinal hematoma. Suboptimal inspiration. Cardiac silhouette normal in size, unchanged. Prominent bronchovascular markings diffusely and central peribronchial thickening, unchanged, with likely bronchiectasis in the right lower lobe. Lungs otherwise clear. No pleural effusions. IMPRESSION: 1. Left jugular introducer sheath tip projects over the left innominate vein. 2. Suboptimal inspiration.  No acute cardiopulmonary disease. Electronically Signed   By: Hulan Saas M.D.   On: 10-19-2015 16:09   Ct Angio Abd/pel W/ And/or W/o  Oct 19, 2015  CLINICAL DATA:  Hypotension after angiogram EXAM: CTA ABDOMEN AND PELVIS wITHOUT AND WITH CONTRAST TECHNIQUE: Multidetector CT imaging of the abdomen and pelvis was performed using the standard protocol during bolus administration of intravenous contrast. Multiplanar reconstructed images and MIPs were obtained and reviewed to evaluate the vascular anatomy. CONTRAST:  100 cc Omnipaque 350 COMPARISON:  None. FINDINGS: There is a large retroperitoneal hemorrhage extending from the right inguinal region into the right retroperitoneum towards  the right upper quadrant. Contrast images demonstrate no pseudoaneurysm or active contrast extravasation. Plaque along the right common iliac artery is again noted. There is no dissection or focal thrombosis. Atherosclerotic changes of the celiac origin SMA origin, and bilateral renal arteries are stable. IMA is diminutive and patent. There is a small amount of hemoperitoneum about the liver. The bladder is deviated to the left secondary to the retroperitoneal hemorrhage. Right total hip arthroplasty is noted. Kidneys are within normal limits. Spleen pancreas and adrenal glands are unremarkable Several cysts throughout the liver are noted. Review of the MIP images confirms the above findings. IMPRESSION: Large right retroperitoneal hemorrhage extending from the right inguinal region after angiogram. There is no active extravasation or bleeding. There is no pseudoaneurysm. Electronically Signed   By: Jolaine Click M.D.   On: 19-Oct-2015 17:53     ASSESSMENT / PLAN:  PULMONARY A: COPD without acute exacerbation Acute resp failure, hypoxia High risk TRALI High  risk uncompensated acidosis  pulm edema concerns  P:   pcxr now for edema likely requires lasix pao2 162, drop to 60%, if unable, then peep to 8 pcxr in am  Increase rate 35, repeat abg  CARDIOVASCULAR CVL LIJ sheath 11/1 >>>  ALINE left fem 11/1>>> A:  Hemorrhagic shock Blue toe syndrome PAD H/o HTN Trop leak, s/p cpr P:  Dc all further top MAP goal 60-65 cvp assessment needed Requires lasix, see renal  RENAL A:   CKD Acute renal failure, ATN, post arrest , shock hypoK At risk abdo compartment P:   bmet q8h She is 14 units pos prbc, ffp 3, cryo 1 - limited output, hypoxia- add lasix  Lasix addition K x 1 run Avoid gross resus with crystalloid, see  Chem Get abdo pressure  GASTROINTESTINAL A:   H/o PUD Active Gi bleed, likely all related to coags, fibrinogen, plat etc  P:   Pepcid dc, plat noted NPO to  remain Add ppi drip suction NGt Correct heme, see heme Get bladder pressure  HEMATOLOGIC Total PRBC 14 ffp 3 Cryo 1 A:   Acute retroperitoneal hemorrhage s/p IR arteriogram  Consumptive / dilutional coagulapthy DIC P:  Cbc q4h inr noted 1.72 Avoid continuous products when able as will continue to dilute  IR and vascular following  SCD Give 10- units cryo, will also help inr Repeat coags in pm , fibrinogen Plat goal 50 k , cbc to follow  INFECTIOUS A:   No acute issues  P:   Surgical ppx with ancef Follow WBC and fever curve  ENDOCRINE A:   Chronic prednisone for polymyalgia rheumatica  Gi bleed P:   Stress dose steroids, continue Follow glucose on Bmet  NEUROLOGIC A:   Acute metabolic encephalopathy in setting shock R/o anoxia, post arrest P:   RASS goal: 0 Monitor closely Low dose fent Avoid benzo  FAMILY  - Updates:   - Inter-disciplinary family meet or Palliative Care meeting due by:  11/7  Ccm time 45 min   [name], MD, FACP Pgr: [phone] [facility]

## 2015-09-28 NOTE — Progress Notes (Signed)
[facility]  Subjective  - overall a little more stable, off pressors   Objective 100/49 97 97.8 F (36.6 C) (Oral) 22 98%  Intake/Output Summary (Last 24 hours) at 09/28/15 1536 Last data filed at 09/28/15 1500  Gross per 24 hour  Intake 13316.13 ml  Output   1182 ml  Net 12134.13 ml   Abdomen soft but full, some incisonal oozing Still with rectal bleeding  Assessment/Planning: Hemoglobin and platelet count both improved Appreciate critical care assistance  [name], Charles 09/28/2015 3:36 PM --  Laboratory Lab Results:  Recent Labs  09/28/15 0451 09/28/15 0842  WBC 9.3 8.4  HGB 3.7*  3.5* 10.4*  HCT 11.1*  10.2* 29.9*  PLT 27* 99*   BMET  Recent Labs  09/28/15 0330 09/28/15 0950  NA 145 150*  K 3.5 3.8  CL 103 113*  CO2 20* 18*  GLUCOSE 155* 122*  BUN 15 18  CREATININE 1.59* 2.06*  CALCIUM 8.2* 7.5*    COAG Lab Results  Component Value Date   INR 1.72* 09/28/2015   INR 1.67* 09/28/2015   INR 3.30* 10/15/2015   No results found for: PTT

## 2015-09-28 NOTE — Progress Notes (Signed)
eLink Physician-Brief Progress Note Patient Name: [name] FlamingMartha H Vanduyn DOB: 05/13/1935 MRN: [id number]   Date of Service  09/28/2015  HPI/Events of Note  Hgb post-surgery now 3.7.  INR earlier was 3.30 at 23:24.  eICU Interventions  Plan: Have ordered repeat CBC/INR 4 units pRBC ordered along with 2 units FFP Post-transfusion CBC/INR ordered     Intervention Category Intermediate Interventions: Bleeding - evaluation and treatment with blood products  DETERDING,ELIZABETH 09/28/2015, 4:38 AM

## 2015-09-28 NOTE — Op Note (Signed)
Procedure: Evacuation of retroperitoneal hematoma, repair of right external iliac artery  Preoperative diagnosis: Retroperitoneal hematoma  Postoperative diagnosis: Same  Anesthesia: Gen.  Assistant: Lianne Cure PA-C  Indications: Patient is a 80 year old female who had a right lower extremity arteriogram performed by Dr. Bonnielee Haff earlier today. She developed a post procedure retroperitoneal hematoma. She was transfused and was relatively stable initially but then became unstable. She was taken emergently to the operating room for exploration of the retroperitoneal hematoma.  Operative findings: Large retroperitoneal hematoma, single hole repair right external iliac artery approximately 3-4 cm above the inguinal ligament  Operative details: After obtaining informed consent from the patient's family, the patient was taken emergently to the operating room. The patient was placed in supine position on the operating table. After induction general anesthesia the patient was prepped and draped in usual sterile fashion from the nipples to the knees. A oblique incision was made on the right lower abdomen carried down through the saphenous tissues down level of the external oblique. The external oblique was incised for the full length of the incision. The internal oblique and transversus abdominis muscles were split along their fibers and I proceeded to dissect along the retroperitoneum. However the retroperitoneum was quite tense from hematoma and eventually the peritoneal cavity was entered. Due to a large amount of edema the bowel could not be retracted easily through this incision. Therefore I converted to a midline laparotomy incision. Midline incision was made extending from the xiphoid down to the pubis incision was carried through the subcutaneous tissues down through the fascia and into the abdomen. There was some free blood within the abdomen. Small bowel and greater omentum were reflected superiorly.  There were some adhesions to the lower portion of pelvis and some of these had to be taken down to adequately expose the right lower quadrant. An Omni retractor was used to assist with retraction. Active bleeding was seen from the external iliac artery anterior surface approximate 4 cm above the inguinal ligament. This was repaired with a single 5-0 Prolene suture. The abdomen was thoroughly irrigated normal saline solution. There was retroperitoneal staining and some old blood within the abdomen and as much of this as possible was evacuated. Small bowel was viable although it did have some patchy areas of malperfusion most likely secondary to compression. This appeared to improve over the course of the operation. The colon was viable. The viscera were returned to their normal position. Midline laparotomy was closed with a #1 PDS suture in the fascia. The skin was closed with staples. Then proceeded to close the right lower quadrant incision. This was much more difficult due to the large rent and the peritoneum and it was difficult to push the bowel back into the abdomen. After several attempts I was able to reapproximate most of the peritoneum and the transverse and internal oblique fascia using a combination of #1 PDS in the fascia and 3-0 Vicryl sutures in the peritoneum.. The patient's external oblique fascia was quite attenuated.  This was also reapproximated using a #1 PDS suture. The skin was then closed with staples. The instrument sponge and needle count was correct again of the case. The patient was taken to the intensive care unit in critical but overall more stable condition.  She remains acidotic and will further fluid resuscitation and most likely blood products. The patient was oozing from a nasogastric tube site at the end of the case and appeared to be quite coagulopathic. Her coagulation factors will also  need to be corrected. She was beginning to make some urine at the end of the case and  hopefully her overall hypovolemic shock will continue to improve.  Fabienne Brunsharles Kourtlynn Trevor, MD [facility] [place] Office: [phone] Pager: [phone]

## 2015-09-28 NOTE — Procedures (Signed)
Central Venous Catheter Insertion Procedure Note [name] FlamingMartha H Herman [id number] 08/19/1935  Procedure: Insertion of Central Venous Catheter Indications: Drug and/or fluid administration  Procedure Details Consent: Risks of procedure as well as the alternatives and risks of each were explained to the (patient/caregiver).  Consent for procedure obtained. Time Out: Verified patient identification, verified procedure, site/side was marked, verified correct patient position, special equipment/implants available, medications/allergies/relevent history reviewed, required imaging and test results available.  Performed  Maximum sterile technique was used including antiseptics, cap, gloves, gown, hand hygiene, mask and sheet. Skin prep: Chlorhexidine; local anesthetic administered A antimicrobial bonded/coated triple lumen catheter was placed in the left internal jugular vein through an existing cortis.  Evaluation Blood flow good Complications: No apparent complications Patient did tolerate procedure well. Chest X-ray ordered to verify placement.  CXR: pending.   Dirk DressKaty Whiteheart, NP 09/28/2015  2:49 PM    Required  [name], MD, FACP Pgr: [phone] [facility]

## 2015-09-28 NOTE — Progress Notes (Signed)
eLink Physician-Brief Progress Note Patient Name: Orma FlamingMartha H Carbone DOB: 02/12/1935 MRN: [id number]   Date of Service  09/28/2015  HPI/Events of Note  Review of portable CXR for L IJ central venous catheter placement reveals the L IJ central venous catheter tip to be at the SVC/R atrial junction. No pneumothorax.   eICU Interventions  OK to use L IJ central venous catheter.      Intervention Category Intermediate Interventions: Diagnostic test evaluation  Lenell AntuSommer,Steven Eugene 09/28/2015, 4:57 PM

## 2015-09-28 NOTE — Anesthesia Postprocedure Evaluation (Signed)
  Anesthesia Post-op Note  Patient: [name] Herman  Procedure(s) Performed: Procedure(s): EVACUATION RIGHT RETROPERITONEAL HEMATOMA (Right) REPAIR RIGHT EXTERNAL ILIAC ARTERY (Right)  Patient Location: ICU  Anesthesia Type:General  Level of Consciousness: sedated, unresponsive and Patient remains intubated per anesthesia plan  Airway and Oxygen Therapy: Patient remains intubated per anesthesia plan  Post-op Pain: none  Post-op Assessment: Post-op Vital signs reviewed              Post-op Vital Signs: Reviewed  Last Vitals:  Filed Vitals:   10/24/2015 2040  BP: 119/75  Pulse: 70  Temp:   Resp: 33    Complications: No apparent anesthesia complications

## 2015-09-28 NOTE — Transfer of Care (Signed)
Immediate Anesthesia Transfer of Care Note  Patient: [name] Herman  Procedure(s) Performed: Procedure(s): EVACUATION RIGHT RETROPERITONEAL HEMATOMA (Right) REPAIR RIGHT EXTERNAL ILIAC ARTERY (Right)  Patient Location: ICU  Anesthesia Type:General  Level of Consciousness: Patient remains intubated per anesthesia plan  Airway & Oxygen Therapy: Patient remains intubated per anesthesia plan and Patient placed on Ventilator (see vital sign flow sheet for setting)  Post-op Assessment: Report given to RN and Post -op Vital signs reviewed and stable  Post vital signs: Reviewed and stable  Last Vitals:  Filed Vitals:   2015/10/08 2040  BP: 119/75  Pulse: 70  Temp:   Resp: 33    Complications: No apparent anesthesia complications

## 2015-09-28 NOTE — Progress Notes (Signed)
Patient ID: [name] Herman, female   DOB: 02/06/1935, 80 y.o.   MRN: [id number] Extensive d/w entire family.Dr Wendi SnipesHaus also present Reviewed current organ failures Plan of care Also reviewed her prior wishes We discussed patients current circumstances and organ failures. We also discussed patient's prior wishes under circumstances such as this. Family has decided to NOT perform resuscitation if arrest but to continue current medical support for now. They would also accept short term HD [name] Herman AliasFeinstein, MD, FACP Pgr: [phone] [facility]   Ccm time in total 90 min

## 2015-09-29 ENCOUNTER — Inpatient Hospital Stay (HOSPITAL_COMMUNITY): Payer: Commercial Managed Care - HMO

## 2015-09-29 DIAGNOSIS — J9600 Acute respiratory failure, unspecified whether with hypoxia or hypercapnia: Secondary | ICD-10-CM

## 2015-09-29 LAB — PREPARE FRESH FROZEN PLASMA
UNIT DIVISION: 0
UNIT DIVISION: 0
UNIT DIVISION: 0
Unit division: 0
Unit division: 0
Unit division: 0
Unit division: 0
Unit division: 0

## 2015-09-29 LAB — PREPARE CRYOPRECIPITATE
UNIT DIVISION: 0
Unit division: 0
Unit division: 0

## 2015-09-29 LAB — CBC
HCT: 29.9 % — ABNORMAL LOW (ref 36.0–46.0)
HCT: 26.7 % — ABNORMAL LOW (ref 36.0–46.0)
HEMATOCRIT: 33.4 % — AB (ref 36.0–46.0)
HEMOGLOBIN: 10.4 g/dL — AB (ref 12.0–15.0)
HEMOGLOBIN: 11.7 g/dL — AB (ref 12.0–15.0)
HEMOGLOBIN: 9.6 g/dL — AB (ref 12.0–15.0)
MCH: 31.7 pg (ref 26.0–34.0)
MCH: 29.3 pg (ref 26.0–34.0)
MCH: 30.5 pg (ref 26.0–34.0)
MCHC: 34.8 g/dL (ref 30.0–36.0)
MCHC: 35 g/dL (ref 30.0–36.0)
MCHC: 36 g/dL (ref 30.0–36.0)
MCV: 91.2 fL (ref 78.0–100.0)
MCV: 83.7 fL (ref 78.0–100.0)
MCV: 84.8 fL (ref 78.0–100.0)
PLATELETS: 47 10*3/uL — AB (ref 150–400)
Platelets: 99 10*3/uL — ABNORMAL LOW (ref 150–400)
Platelets: 68 10*3/uL — ABNORMAL LOW (ref 150–400)
RBC: 3.28 MIL/uL — ABNORMAL LOW (ref 3.87–5.11)
RBC: 3.99 MIL/uL (ref 3.87–5.11)
RBC: 3.15 MIL/uL — AB (ref 3.87–5.11)
RDW: 15.1 % (ref 11.5–15.5)
RDW: 16.1 % — AB (ref 11.5–15.5)
RDW: 17.1 % — ABNORMAL HIGH (ref 11.5–15.5)
WBC: 8.4 10*3/uL (ref 4.0–10.5)
WBC: 17.6 10*3/uL — AB (ref 4.0–10.5)
WBC: 14.3 10*3/uL — AB (ref 4.0–10.5)

## 2015-09-29 LAB — BLOOD GAS, ARTERIAL
Acid-base deficit: 0.3 mmol/L (ref 0.0–2.0)
BICARBONATE: 22.4 meq/L (ref 20.0–24.0)
DRAWN BY: 252031
FIO2: 0.3
O2 Saturation: 96.8 %
PCO2 ART: 28 mmHg — AB (ref 35.0–45.0)
PEEP: 5 cmH2O
Patient temperature: 98.6
RATE: 22 resp/min
TCO2: 23.2 mmol/L (ref 0–100)
VT: 400 mL
pH, Arterial: 7.514 — ABNORMAL HIGH (ref 7.350–7.450)
pO2, Arterial: 79.5 mmHg — ABNORMAL LOW (ref 80.0–100.0)

## 2015-09-29 LAB — BASIC METABOLIC PANEL
ANION GAP: 15 (ref 5–15)
ANION GAP: 18 — AB (ref 5–15)
Anion gap: 21 — ABNORMAL HIGH (ref 5–15)
Anion gap: 16 — ABNORMAL HIGH (ref 5–15)
BUN: 28 mg/dL — AB (ref 6–20)
BUN: 29 mg/dL — AB (ref 6–20)
BUN: 14 mg/dL (ref 6–20)
BUN: 30 mg/dL — AB (ref 6–20)
CALCIUM: 7 mg/dL — AB (ref 8.9–10.3)
CALCIUM: 7.1 mg/dL — AB (ref 8.9–10.3)
CALCIUM: 7.7 mg/dL — AB (ref 8.9–10.3)
CHLORIDE: 110 mmol/L (ref 101–111)
CO2: 18 mmol/L — ABNORMAL LOW (ref 22–32)
CO2: 23 mmol/L (ref 22–32)
CO2: 22 mmol/L (ref 22–32)
CO2: 22 mmol/L (ref 22–32)
CREATININE: 2.91 mg/dL — AB (ref 0.44–1.00)
CREATININE: 3.19 mg/dL — AB (ref 0.44–1.00)
Calcium: 7 mg/dL — ABNORMAL LOW (ref 8.9–10.3)
Chloride: 111 mmol/L (ref 101–111)
Chloride: 110 mmol/L (ref 101–111)
Chloride: 100 mmol/L — ABNORMAL LOW (ref 101–111)
Creatinine, Ser: 3.34 mg/dL — ABNORMAL HIGH (ref 0.44–1.00)
Creatinine, Ser: 2.1 mg/dL — ABNORMAL HIGH (ref 0.44–1.00)
GFR calc Af Amer: 17 mL/min — ABNORMAL LOW (ref >60)
GFR calc Af Amer: 15 mL/min — ABNORMAL LOW (ref >60)
GFR calc Af Amer: 14 mL/min — ABNORMAL LOW (ref >60)
GFR calc non Af Amer: 13 mL/min — ABNORMAL LOW (ref >60)
GFR calc non Af Amer: 12 mL/min — ABNORMAL LOW (ref >60)
GFR, EST AFRICAN AMERICAN: 25 mL/min — AB (ref >60)
GFR, EST NON AFRICAN AMERICAN: 14 mL/min — AB (ref >60)
GFR, EST NON AFRICAN AMERICAN: 21 mL/min — AB (ref >60)
GLUCOSE: 145 mg/dL — AB (ref 65–99)
Glucose, Bld: 153 mg/dL — ABNORMAL HIGH (ref 65–99)
Glucose, Bld: 148 mg/dL — ABNORMAL HIGH (ref 65–99)
Glucose, Bld: 146 mg/dL — ABNORMAL HIGH (ref 65–99)
POTASSIUM: 3 mmol/L — AB (ref 3.5–5.1)
POTASSIUM: 3.4 mmol/L — AB (ref 3.5–5.1)
Potassium: 3.3 mmol/L — ABNORMAL LOW (ref 3.5–5.1)
Potassium: 3 mmol/L — ABNORMAL LOW (ref 3.5–5.1)
SODIUM: 140 mmol/L (ref 135–145)
SODIUM: 149 mmol/L — AB (ref 135–145)
SODIUM: 150 mmol/L — AB (ref 135–145)
Sodium: 147 mmol/L — ABNORMAL HIGH (ref 135–145)

## 2015-09-29 LAB — PREPARE PLATELET PHERESIS
UNIT DIVISION: 0
Unit division: 0

## 2015-09-29 LAB — GLUCOSE, CAPILLARY
GLUCOSE-CAPILLARY: 148 mg/dL — AB (ref 65–99)
GLUCOSE-CAPILLARY: 129 mg/dL — AB (ref 65–99)
Glucose-Capillary: 138 mg/dL — ABNORMAL HIGH (ref 65–99)
Glucose-Capillary: 137 mg/dL — ABNORMAL HIGH (ref 65–99)

## 2015-09-29 LAB — HEMOGLOBIN AND HEMATOCRIT, BLOOD
HCT: 21.9 % — ABNORMAL LOW (ref 36.0–46.0)
HEMATOCRIT: 22.1 % — AB (ref 36.0–46.0)
HEMOGLOBIN: 8.1 g/dL — AB (ref 12.0–15.0)
Hemoglobin: 7.9 g/dL — ABNORMAL LOW (ref 12.0–15.0)

## 2015-09-29 LAB — MAGNESIUM
MAGNESIUM: 1.6 mg/dL — AB (ref 1.7–2.4)
MAGNESIUM: 1.7 mg/dL (ref 1.7–2.4)
Magnesium: 2.1 mg/dL (ref 1.7–2.4)

## 2015-09-29 LAB — PROTIME-INR
INR: 1.67 — AB (ref 0.00–1.49)
PROTHROMBIN TIME: 19.7 s — AB (ref 11.6–15.2)

## 2015-09-29 LAB — PHOSPHORUS
PHOSPHORUS: 3.7 mg/dL (ref 2.5–4.6)
Phosphorus: 4.9 mg/dL — ABNORMAL HIGH (ref 2.5–4.6)

## 2015-09-29 LAB — FIBRINOGEN: Fibrinogen: 497 mg/dL — ABNORMAL HIGH (ref 204–475)

## 2015-09-29 LAB — PREPARE RBC (CROSSMATCH)

## 2015-09-29 MED ORDER — SODIUM CHLORIDE 0.9 % IV SOLN: Freq: Once | INTRAVENOUS | Status: AC

## 2015-09-29 MED ORDER — ETOMIDATE 2 MG/ML IV SOLN
INTRAVENOUS | Status: AC
  Administered 2015-09-29: 20 mg
  Filled 2015-09-29: qty 10

## 2015-09-29 MED ORDER — SODIUM CHLORIDE 0.9 % IV SOLN: 100.0000 mL | INTRAVENOUS | Status: DC | PRN

## 2015-09-29 MED ORDER — HEPARIN SODIUM (PORCINE) 1000 UNIT/ML DIALYSIS
1000.0000 [IU] | INTRAMUSCULAR | Status: DC | PRN
Start: 2015-09-29 — End: 2015-09-30

## 2015-09-29 MED ORDER — ALTEPLASE 2 MG IJ SOLR
2.0000 mg | Freq: Once | INTRAMUSCULAR | Status: DC | PRN
  Filled 2015-09-29: qty 2

## 2015-09-29 MED ORDER — PENTAFLUOROPROP-TETRAFLUOROETH EX AERO: 1.0000 "application " | INHALATION_SPRAY | CUTANEOUS | Status: DC | PRN

## 2015-09-29 MED ORDER — NOREPINEPHRINE BITARTRATE 1 MG/ML IV SOLN
2.0000 ug/min | INTRAVENOUS | Status: DC
  Administered 2015-09-29: 16 ug/min via INTRAVENOUS
  Filled 2015-09-29: qty 16

## 2015-09-29 MED ORDER — INSULIN ASPART 100 UNIT/ML ~~LOC~~ SOLN: 0.0000 [IU] | SUBCUTANEOUS | Status: DC

## 2015-09-29 MED ORDER — LIDOCAINE-PRILOCAINE 2.5-2.5 % EX CREA: 1.0000 "application " | TOPICAL_CREAM | CUTANEOUS | Status: DC | PRN

## 2015-09-29 MED ORDER — MAGNESIUM SULFATE 2 GM/50ML IV SOLN
2.0000 g | Freq: Once | INTRAVENOUS | Status: AC
  Administered 2015-09-29: 2 g via INTRAVENOUS
  Filled 2015-09-29: qty 50

## 2015-09-29 MED ORDER — LIDOCAINE HCL (PF) 1 % IJ SOLN: 5.0000 mL | INTRAMUSCULAR | Status: DC | PRN

## 2015-09-29 MED ORDER — INSULIN ASPART 100 UNIT/ML ~~LOC~~ SOLN
0.0000 [IU] | Freq: Three times a day (TID) | SUBCUTANEOUS | Status: DC
  Administered 2015-09-29: 1 [IU] via SUBCUTANEOUS

## 2015-09-29 MED ORDER — INSULIN ASPART 100 UNIT/ML ~~LOC~~ SOLN
0.0000 [IU] | SUBCUTANEOUS | Status: DC
  Administered 2015-09-29 – 2015-10-04 (×7): 1 [IU] via SUBCUTANEOUS
  Administered 2015-10-04: 2 [IU] via SUBCUTANEOUS
  Administered 2015-10-04 (×2): 1 [IU] via SUBCUTANEOUS
  Administered 2015-10-04: 2 [IU] via SUBCUTANEOUS
  Administered 2015-10-05 – 2015-10-08 (×8): 1 [IU] via SUBCUTANEOUS
  Administered 2015-10-08: 2 [IU] via SUBCUTANEOUS
  Administered 2015-10-08 (×2): 1 [IU] via SUBCUTANEOUS
  Administered 2015-10-08: 2 [IU] via SUBCUTANEOUS
  Administered 2015-10-09 – 2015-10-12 (×8): 1 [IU] via SUBCUTANEOUS

## 2015-09-29 NOTE — Progress Notes (Signed)
Initial Nutrition Assessment  DOCUMENTATION CODES:   Not applicable  INTERVENTION:    When GIB resolves, recommend initiate TF via OGT with Vital AF 1.2 at 25 ml/h and Prostat 30 ml BID on day 1; on day 2, d/c Prostat and increase to goal rate of 45 ml/h (1080 ml per day) to provide 1296 kcals, 81 gm protein, 876 ml free water daily.  NUTRITION DIAGNOSIS:   Inadequate oral intake related to inability to eat as evidenced by NPO status.  GOAL:   Patient will meet greater than or equal to 90% of their needs  MONITOR:   Vent status, Weight trends, Labs  REASON FOR ASSESSMENT:   Ventilator    ASSESSMENT:   80 year old female who [facility]/24/2015 for iliac artery stent placement. Procedure was done, no stent needed. Post-operatively she complained of nausea and had hypotension. Distended abdomen. CT with with retroperitoneal blood, not actively bleeding per Dr. Bonnielee HaffHoss.  Labs reviewed: sodium and phos elevated; potassium and mag low. No plans to start TF today with ongoing GIB. May start feedings tomorrow if GI bleeding continues to improve. Nutrition-Focused physical exam completed. Findings are no fat depletion, no muscle depletion, and mild edema.  Patient is currently intubated on ventilator support MV: 8.6 L/min Temp (24hrs), Avg:98.3 F (36.8 C), Min:97.5 F (36.4 C), Max:99.1 F (37.3 C)   Diet Order:   NPO  Skin:  Reviewed, no issues  Last BM:  11/2  Height:   Ht Readings from Last 1 Encounters:  October 22, 2015 5' (1.524 m)    Weight:   Wt Readings from Last 1 Encounters:  09/29/15 146 lb 13.2 oz (66.6 kg)    Ideal Body Weight:  45.5 kg  BMI:  Body mass index is 28.68 kg/(m^2).  Estimated Nutritional Needs:   Kcal:  1314  Protein:  80-90 gm  Fluid:  1.5 L  EDUCATION NEEDS:   No education needs identified at this time  Joaquin CourtsKimberly Conrad Zajkowski, RD, LDN, CNSC Pager [phone] After Hours Pager [phone]

## 2015-09-29 NOTE — Progress Notes (Signed)
[facility]  Subjective  - off pressors  Objective 122/62 54 98.8 F (37.1 C) (Oral) 19 95%  Intake/Output Summary (Last 24 hours) at 09/29/15 0940 Last data filed at 09/29/15 0800  Gross per 24 hour  Intake 2284.97 ml  Output    695 ml  Net 1589.97 ml   Abdomen: still with some serous drainage right lower quadrant and midline incison, overal abdomen is softer  Assessment/Planning: Overall more stable, coags seems to be corrected ATN hopefully kidneys will recover Dry dressing on incisions if draining  [name], Acen Craun 09/29/2015 9:40 AM --  Laboratory Lab Results:  Recent Labs  09/28/15 0451 09/28/15 0842  09/28/15 2330 09/29/15 0518  WBC 9.3 8.4  --   --   --   HGB 3.7*  3.5* 10.4*  < > 8.1* 7.9*  HCT 11.1*  10.2* 29.9*  < > 22.1* 21.9*  PLT 27* 99*  --   --   --   < > = values in this interval not displayed. BMET  Recent Labs  09/29/15 0519 09/29/15 0756  NA 147* 149*  K 3.0* 3.0*  CL 110 110  CO2 22 23  GLUCOSE 145* 148*  BUN 29* 30*  CREATININE 3.19* 3.34*  CALCIUM 7.0* 7.0*    COAG Lab Results  Component Value Date   INR 1.66* 09/28/2015   INR 1.72* 09/28/2015   INR 1.67* 09/28/2015   No results found for: PTT

## 2015-09-29 NOTE — H&P (Signed)
PULMONARY / CRITICAL CARE MEDICINE   Name: [name] Herman MRN: [id number] DOB: [birth year]    ADMISSION DATE:  10/01/2015 CONSULTATION DATE:  Oct 01, 2015  REFERRING MD :  Dr. Bonnielee Haff, IR  CHIEF COMPLAINT:  RP bleeding after IR procedure  INITIAL PRESENTATION: 80 year old female who presented to [facility] 01-Oct-2015 for iliac artery stent placement. Procedure was done, no stent needed. Post-operatively she complained of nausea and had hypotension. Distended abdomen. CT with with retroperitoneal blood, not actively bleeding per Dr. Bonnielee Haff. To ICU for PCCM management.   STUDIES:  CT (A) abd/pelvis 10-01-2023) >>> Lower extremity arterial duplex 01-Oct-2023)   SIGNIFICANT EVENTS: 2023-10-01 to IR for iliac stent, stent not needed, post op RP blood. Shock. To ICU 10-01-2023 coded pea 10 min , OR for evacuation hematoma and iliac repair  11/2- GI bleeding, renal called 11/3- less bleeding from OGT, no pressors  SUBJECTIVE: less following commands, low output remains  VITAL SIGNS: Temp:  [97.5 F (36.4 C)-99.1 F (37.3 C)] 98.8 F (37.1 C) (11/03 0820) Pulse Rate:  [54-117] 54 (11/03 0800) Resp:  [17-35] 19 (11/03 0800) BP: (100-122)/(48-62) 122/62 mmHg (11/03 0700) SpO2:  [92 %-100 %] 95 % (11/03 0827) Arterial Line BP: (78-128)/(39-58) 128/56 mmHg (11/03 0800) FiO2 (%):  [30 %-60 %] 30 % (11/03 0827) Weight:  [66.6 kg (146 lb 13.2 oz)] 66.6 kg (146 lb 13.2 oz) (11/03 0200) HEMODYNAMICS: CVP:  [20 mmHg-21 mmHg] 20 mmHg VENTILATOR SETTINGS: Vent Mode:  [-] PRVC FiO2 (%):  [30 %-60 %] 30 % Set Rate:  [22 bmp-35 bmp] 22 bmp Vt Set:  [400 mL] 400 mL PEEP:  [5 cmH20] 5 cmH20 Plateau Pressure:  [21 cmH20-30 cmH20] 22 cmH20 INTAKE / OUTPUT:  Intake/Output Summary (Last 24 hours) at 09/29/15 4098 Last data filed at 09/29/15 0800  Gross per 24 hour  Intake 2284.97 ml  Output    695 ml  Net 1589.97 ml    PHYSICAL EXAMINATION: General:  Vented, sedated rass -1 Neuro:  Awakens, opens  eyes, moves all ext equally HEENT: jvd up Cardiovascular:  s1 s2 RRR Lungs: cta Abdomen:  softer, hypoactive, no r/ Musculoskeletal:  Upper ext edema rt dopplerable, left palpable Skin:  Necrotic appearing R toes.   LABS:  CBC  Recent Labs Lab 09/28/15 0330 09/28/15 0451 09/28/15 0842 09/28/15 1805 09/28/15 2330 09/29/15 0518  WBC 8.8 9.3 8.4  --   --   --   HGB 3.7* 3.7*  3.5* 10.4* 8.5* 8.1* 7.9*  HCT 11.0* 11.1*  10.2* 29.9* 23.7* 22.1* 21.9*  PLT 21* 27* 99*  --   --   --    Coag's  Recent Labs Lab 10/01/15 1030 October 01, 2015 1900 10/01/2015 2324 09/28/15 0451 09/28/15 0842 09/28/15 1805  APTT 21* 56* 118*  --   --   --   INR 0.96 2.07* 3.30* 1.67* 1.72* 1.66*   BMET  Recent Labs Lab 09/28/15 2330 09/29/15 0519 09/29/15 0756  NA 150* 147* 149*  K 3.3* 3.0* 3.0*  CL 111 110 110  CO2 18* 22 23  BUN 28* 29* 30*  CREATININE 2.91* 3.19* 3.34*  GLUCOSE 153* 145* 148*   Electrolytes  Recent Labs Lab 10/01/15 1900  09/28/15 1408 09/28/15 1615 09/28/15 2330 09/29/15 0519 09/29/15 0756  CALCIUM 6.0*  < >  --  7.3* 7.1* 7.0* 7.0*  MG 1.4*  --  1.9  --  1.7 1.6*  --   PHOS 6.8*  --   --  6.2*  --  4.9*  --   < > = values in this interval not displayed. Sepsis Markers  Recent Labs Lab 10/04/2015 1900 09/28/15 0121 09/28/15 0410  LATICACIDVEN 16.5* 14.3* 15.2*   ABG  Recent Labs Lab 09/28/15 0107 09/28/15 1116 09/29/15 0710  PHART 7.170* 7.560* 7.514*  PCO2ART 37.2 22.8* 28.0*  PO2ART 162.0* 135.0* 79.5*   Liver Enzymes  Recent Labs Lab 10/13/2015 1640 10/19/2015 1950  AST 27 123*  ALT 13* 121*  ALKPHOS 41 24*  BILITOT 0.7 0.5  ALBUMIN 2.6* 1.0*   Cardiac Enzymes  Recent Labs Lab 09/28/15 0113 09/28/15 0330 09/28/15 0731  TROPONINI 0.41* 0.34* 0.61*   Glucose  Recent Labs Lab 09/30/2015 1356 10/13/2015 1618 10/14/2015 1901  GLUCAP 194* 171* 275*    Imaging Dg Chest Port 1 View  09/28/2015  CLINICAL DATA:  Bedside central  venous catheter placement. Ventilator dependent respiratory failure. Followup left pleural effusion. EXAM: PORTABLE CHEST 1 VIEW 1625 hr: COMPARISON:  Chest x-ray earlier same 1053 hr and previously. FINDINGS: Left jugular central venous catheter tip projects at or near the cavoatrial junction. No evidence of pneumothorax or mediastinal hematoma. Endotracheal tube tip remains in satisfactory position approximately 3 cm above the carina. Nasogastric tube tip in the stomach. Small to moderate-sized left pleural effusion and associated consolidation of the left lower lobe, unchanged since earlier in the day. Suboptimal inspiration which accounts for atelectasis at the right lung base. IMPRESSION: 1. Left jugular central venous catheter tip projects at or near the cavoatrial junction. No complicating features. 2. Stable small moderate size left pleural effusion and associated atelectasis versus pneumonia in the left lower lobe. 3. Suboptimal inspiration accounts for right basilar atelectasis. Electronically Signed   By: Hulan Saashomas  Lawrence M.D.   On: 09/28/2015 16:34   Dg Chest Port 1 View  09/28/2015  CLINICAL DATA:  Respiratory distress.  Pulmonary edema. EXAM: PORTABLE CHEST 1 VIEW COMPARISON:  10/09/2015 FINDINGS: Endotracheal tube is been inserted and is in good position. NG tube has been inserted and the tip is in the stomach. Central venous sheath tip is in the proximal left innominate vein, unchanged. Heart size and pulmonary vascularity are normal. New small left pleural effusion and probable slight atelectasis at the left base. IMPRESSION: New small left effusion and atelectasis at the left lung base. Tubes in good position. Electronically Signed   By: Francene BoyersJames  Maxwell M.D.   On: 09/28/2015 11:21   Dg Abd Portable 1v  09/29/2015  CLINICAL DATA:  Abdominal bleed. EXAM: PORTABLE ABDOMEN - 1 VIEW COMPARISON:  CT 10/15/2015. FINDINGS: NG tube noted with tip projected over the stomach. No bowel distention. No free  air. Degenerative changes scoliosis lumbar spine. Right hip replacement. Surgical staples over the right abdomen. Bibasilar subsegmental atelectasis. IMPRESSION: NG tube tip noted projected over the stomach. No bowel distention. No free air. Electronically Signed   By: Maisie Fushomas  Register   On: 09/29/2015 07:00     ASSESSMENT / PLAN:  PULMONARY A: COPD without acute exacerbation Acute resp failure, hypoxia High risk TRALI High risk uncompensated acidosis  pulm edema concerns  P:   IMproved vent needs ABg reviewed, reduce rate 12 SBT today cpap 5 ps 5, would accept rr 35, tv 225 pcxr now for edema Lasix per renal Concern pos balance again, consider HD  CARDIOVASCULAR CVL LIJ sheath 11/1 >>>  ALINE left fem 11/1>>> A:  Hemorrhagic shock resolved Blue toe syndrome PAD H/o HTN Trop leak, s/p cpr P:  May  need levo for HD if chosen MAP wnl on own Cbc to follow  RENAL A:   CKD Acute renal failure, ATN, post arrest , shock hypoK At risk abdo compartment, bladder pressure lower Hypernatremia hypokalemia P:   bmet q8h Consider HD today, low to no output, pos 14 liters in 48 hrs K supp d5w dc if HD chosen  GASTROINTESTINAL A:   H/o PUD Active Gi bleed, likely all related to coags, fibrinogen, plat etc IMproved bleeding P:   Start TF in am if bleeding continues to improve ppi drip suction NGt, consider to gravity Neg balance for badder pressure also important s/p massive resus  HEMATOLOGIC Total PRBC 14 ffp 3 Cryo 11 A:   Acute retroperitoneal hemorrhage s/p IR arteriogram  Consumptive / dilutional coagulapthy DIC improved with cryo P:  Cbc to q8h consider 1 unit prbc with HD coags repeat now Cbc for plat now  INFECTIOUS A:   No acute issues  P:   Off all abx No fevers  ENDOCRINE A:   Chronic prednisone for polymyalgia rheumatica  Gi bleed P:   Stress dose steroids, continue Follow glucose on Bmet Add ssi with d5w  NEUROLOGIC A:   Acute  metabolic encephalopathy up and down R/o anoxia, post arrest P:   RASS goal: 0 Monitor closely Low dose fent, consider to int Avoid benzo  FAMILY  - Updates: family updated in full  - Inter-disciplinary family meet or Palliative Care meeting due by:  11/7  Ccm time 45 min   Mcarthur Rossetti. Tyson Alias, MD, FACP Pgr: [phone] [facility]

## 2015-09-29 NOTE — Progress Notes (Signed)
Patient ID: [name], female   DOB: 01/04/1935, 80 y.o.   MRN: [id number] [name] is still intubated and ventilated. She is less responsive today but does open her eyes. Bloody output from NGT, rectum, and through abdominal wound is improving and diminishing. An HD cath was placed today, secondary to a lack of a response to lasix. She is currently undergoing CVVHD to remove two liters of fluid and administer a unit of PRBCs.   BP110/60 (on 22 levophed for CVVHD support), pulse 94, pulse ox 96% on FiO2 30%. UOP minimal. Abdomen is nondistended (bladder manometer reads 27 mm Hg today down from 30 mm Hg yesterday) Her toes remain bluish with dry gangrene on 2nd and fourth toes.  Hb 7.9 (this morning) INR 1.67 Fibrinogen 497  CXR today show left basilar volume loss but no overt pulmonary edema.  Overall, she is improving from a bleeding and resuscitation standpoint. I'm happy to see HD is agreed by family and supporting volume removal. Hopefully, the bleeding will continue to subside and feedings can begin.

## 2015-09-29 NOTE — Progress Notes (Signed)
Admit: 10/22/2015 LOS: 2  65F w/ anuric ATN 2/2 hemorrhagic shock, PEA arrest, and contrast nephropathy  Subjective:  No overnight events Hb trending down,s till with bloody contents on NG tube Minimal UOP/response to bolus lasix 30% FIO2, no pressors  [id number]  11/02 0701 - 11/03 0700 In: 2587 [I.V.:1869; Blood:406; NG/GT:60; IV Piggyback:212] Out: 1105 [Urine:155; Emesis/NG output:950]  Filed Weights   11-10-2015 0938 09/29/15 0200  Weight: 46.267 kg (102 lb) 66.6 kg (146 lb 13.2 oz)    Scheduled Meds: . sodium chloride   Intravenous Once  . sodium chloride   Intravenous Once  . sodium chloride   Intravenous Once  . antiseptic oral rinse  7 mL Mouth Rinse QID  . budesonide  0.25 mg Nebulization Q12H  . chlorhexidine gluconate  15 mL Mouth Rinse BID  . fentaNYL (SUBLIMAZE) injection  50 mcg Intravenous Once  . hydrocortisone sod succinate (SOLU-CORTEF) inj  50 mg Intravenous Q6H  . ipratropium-albuterol  3 mL Nebulization Q4H  . pantoprazole (PROTONIX) IV  80 mg Intravenous Once  . [START ON 10/01/2015] pantoprazole (PROTONIX) IV  40 mg Intravenous Q12H   Continuous Infusions: . sodium chloride Stopped (09/28/15 1457)  . dextrose 50 mL/hr at 09/29/15 0400  . fentaNYL infusion INTRAVENOUS 100 mcg/hr (09/29/15 0400)  . furosemide (LASIX) infusion 5 mg (09/29/15 0400)  . norepinephrine (LEVOPHED) Adult infusion Stopped (09/28/15 0520)  . pantoprozole (PROTONIX) infusion 8 mg/hr (09/29/15 0400)   PRN Meds:.sodium chloride, fentaNYL, fentaNYL (SUBLIMAZE) injection, iodixanol, ondansetron (ZOFRAN) IV  Current Labs: reviewed    Physical Exam:  Blood pressure 122/62, pulse 54, temperature 97.5 F (36.4 C), temperature source Oral, resp. rate 19, height 5' (1.524 m), weight 66.6 kg (146 lb 13.2 oz), SpO2 100 %. GEN: Intubated and agitated ENT: ET tube in place . Left neck with Cordis catheter EYES: Eyes closed CV: RRR PULM: Coarse bilaterally ABD: Midline surgical wound  bandaged, soft SKIN: Ischemic changes in the distal right lower extremity noted  A/P 1. Anuric AKI 2/2 ATN 1. Baseline SCr ~1.1 2. Foley catheter in place; CT A/P 11/1 w unremarkable kidney appearance 3. No immediate need for HD/RRT -- if needed probably could attempt gentle iHD for volume removal -- cont to discuss with CCM 4. Daily weights, Daily Renal Panel, Strict I/Os, Avoid nephrotoxins (NSAIDs, judicious IV Contrast) 5. Unlikely to respond to diuretics currently, no further bolus attempts 1. Hemorrhagic Shock: off pressors, trending down, per CCM 2. VDRF per CCM 3. RP Hematoma s/p evaculation and arterial repair 4. PAD 5. HTN off meds, not on ACEi as outpt  Sabra Heckyan Darya Bigler MD 09/29/2015, 8:01 AM   Recent Labs Lab 11-10-2015 1900  09/28/15 1615 09/28/15 2330 09/29/15 0519  NA 152*  < > 150* 150* 147*  K 4.9  < > 3.5 3.3* 3.0*  CL 107  < > 112* 111 110  CO2 24  < > 19* 18* 22  GLUCOSE 292*  < > 137* 153* 145*  BUN 18  < > 22* 28* 29*  CREATININE 1.16*  < > 2.47* 2.91* 3.19*  CALCIUM 6.0*  < > 7.3* 7.1* 7.0*  PHOS 6.8*  --  6.2*  --  4.9*  < > = values in this interval not displayed.  Recent Labs Lab 11-10-2015 1640 11-10-2015 1900  09/28/15 0330 09/28/15 0451 09/28/15 0842 09/28/15 1805 09/28/15 2330 09/29/15 0518  WBC 27.1* 13.8*  < > 8.8 9.3 8.4  --   --   --   NEUTROABS 21.9*  10.6*  --   --   --   --   --   --   --   HGB 12.7 5.6*  < > 3.7* 3.7*  3.5* 10.4* 8.5* 8.1* 7.9*  HCT 37.2 17.0*  < > 11.0* 11.1*  10.2* 29.9* 23.7* 22.1* 21.9*  MCV 88.6 89.5  < > 83.3 83.6 91.2  --   --   --   PLT 216 115*  < > 21* 27* 99*  --   --   --   < > = values in this interval not displayed.

## 2015-09-29 NOTE — Progress Notes (Signed)
Patient ID: [name] Herman, female   DOB: 06/25/1935, 80 y.o.   MRN: [id number]  Mrs. Godshall is 2 days s/p development of hypovolemic shock after an angiogram. Currently, she is still on the vent and sedated. An NGT is in with bloody output. She is off pressors with BP 115/51 and pulse 94. Temp is 97.5. Pulse ox is 96% on FiO2 30%. She is still oliguric with 30 cc UOP since MN.  Hb 7.9 BUN/Cr 29/3.19 INR 1.66 ABG pH 7.56, CO2 22.8, O2 135.  Examination reveals that her abdomen is much less distended.   She is hemodynamically improved but remains in renal failure with presumed gastric GI bleeding. Hopefully she will continue to improve and stabilize.

## 2015-09-29 NOTE — Procedures (Signed)
Central Venous Catheter Insertion Procedure Note - HD [name] Herman [id number] 06/08/1935  Procedure: InsertOrma Flamingion of Central Venous Catheter Indications: hd  Procedure Details Consent: Risks of procedure as well as the alternatives and risks of each were explained to the (patient/caregiver).  Consent for procedure obtained. Time Out: Verified patient identification, verified procedure, site/side was marked, verified correct patient position, special equipment/implants available, medications/allergies/relevent history reviewed, required imaging and test results available.  Performed  Maximum sterile technique was used including antiseptics, cap, gloves, gown, hand hygiene, mask and sheet. Skin prep: Chlorhexidine; local anesthetic administered A antimicrobial bonded/coated triple lumen catheter was placed in the right internal jugular vein using the Seldinger technique.  Evaluation Blood flow good Complications: No apparent complications Patient did tolerate procedure well. Chest X-ray ordered to verify placement.  CXR: pending.  [name] Herman,[name] J. 09/29/2015, 12:50 PM  US Blood loss 5 cc  [name] Herman AliasFeinstein, MD, FACP Pgr: [phone] [name] Herman

## 2015-09-30 ENCOUNTER — Encounter (HOSPITAL_BASED_OUTPATIENT_CLINIC_OR_DEPARTMENT_OTHER): Payer: Commercial Managed Care - HMO | Attending: Internal Medicine

## 2015-09-30 ENCOUNTER — Inpatient Hospital Stay (HOSPITAL_COMMUNITY): Payer: Commercial Managed Care - HMO

## 2015-09-30 DIAGNOSIS — N179 Acute kidney failure, unspecified: Secondary | ICD-10-CM | POA: Insufficient documentation

## 2015-09-30 LAB — COMPREHENSIVE METABOLIC PANEL
ALBUMIN: 2.3 g/dL — AB (ref 3.5–5.0)
ALT: 232 U/L — AB (ref 14–54)
ANION GAP: 11 (ref 5–15)
AST: 609 U/L — ABNORMAL HIGH (ref 15–41)
Alkaline Phosphatase: 53 U/L (ref 38–126)
BUN: 22 mg/dL — ABNORMAL HIGH (ref 6–20)
CALCIUM: 6.6 mg/dL — AB (ref 8.9–10.3)
CHLORIDE: 105 mmol/L (ref 101–111)
CO2: 23 mmol/L (ref 22–32)
CREATININE: 2.98 mg/dL — AB (ref 0.44–1.00)
GFR calc non Af Amer: 14 mL/min — ABNORMAL LOW (ref >60)
GFR, EST AFRICAN AMERICAN: 16 mL/min — AB (ref >60)
Glucose, Bld: 131 mg/dL — ABNORMAL HIGH (ref 65–99)
Potassium: 3.4 mmol/L — ABNORMAL LOW (ref 3.5–5.1)
SODIUM: 139 mmol/L (ref 135–145)
Total Bilirubin: 2.2 mg/dL — ABNORMAL HIGH (ref 0.3–1.2)
Total Protein: 4.6 g/dL — ABNORMAL LOW (ref 6.5–8.1)

## 2015-09-30 LAB — TYPE AND SCREEN
ABO/RH(D): B POS
Antibody Screen: NEGATIVE
UNIT DIVISION: 0
UNIT DIVISION: 0
UNIT DIVISION: 0
UNIT DIVISION: 0
UNIT DIVISION: 0
UNIT DIVISION: 0
UNIT DIVISION: 0
UNIT DIVISION: 0
Unit division: 0
Unit division: 0
Unit division: 0
Unit division: 0
Unit division: 0
Unit division: 0
Unit division: 0

## 2015-09-30 LAB — POCT I-STAT 3, ART BLOOD GAS (G3+)
BICARBONATE: 24.9 meq/L — AB (ref 20.0–24.0)
O2 Saturation: 96 %
PCO2 ART: 40.6 mmHg (ref 35.0–45.0)
PH ART: 7.396 (ref 7.350–7.450)
PO2 ART: 80 mmHg (ref 80.0–100.0)
Patient temperature: 98.7
TCO2: 26 mmol/L (ref 0–100)

## 2015-09-30 LAB — RENAL FUNCTION PANEL
ANION GAP: 13 (ref 5–15)
Albumin: 2.7 g/dL — ABNORMAL LOW (ref 3.5–5.0)
BUN: 23 mg/dL — ABNORMAL HIGH (ref 6–20)
CHLORIDE: 102 mmol/L (ref 101–111)
CO2: 25 mmol/L (ref 22–32)
Calcium: 7.3 mg/dL — ABNORMAL LOW (ref 8.9–10.3)
Creatinine, Ser: 3.01 mg/dL — ABNORMAL HIGH (ref 0.44–1.00)
GFR, EST AFRICAN AMERICAN: 16 mL/min — AB (ref >60)
GFR, EST NON AFRICAN AMERICAN: 14 mL/min — AB (ref >60)
Glucose, Bld: 124 mg/dL — ABNORMAL HIGH (ref 65–99)
POTASSIUM: 4.1 mmol/L (ref 3.5–5.1)
Phosphorus: 3.8 mg/dL (ref 2.5–4.6)
Sodium: 140 mmol/L (ref 135–145)

## 2015-09-30 LAB — PHOSPHORUS
PHOSPHORUS: 3.3 mg/dL (ref 2.5–4.6)
Phosphorus: 3.7 mg/dL (ref 2.5–4.6)

## 2015-09-30 LAB — HEPATITIS B SURFACE ANTIBODY,QUALITATIVE: Hep B S Ab: REACTIVE

## 2015-09-30 LAB — GLUCOSE, CAPILLARY
Glucose-Capillary: 145 mg/dL — ABNORMAL HIGH (ref 65–99)
Glucose-Capillary: 132 mg/dL — ABNORMAL HIGH (ref 65–99)
Glucose-Capillary: 132 mg/dL — ABNORMAL HIGH (ref 65–99)
Glucose-Capillary: 107 mg/dL — ABNORMAL HIGH (ref 65–99)
Glucose-Capillary: 99 mg/dL (ref 65–99)

## 2015-09-30 LAB — CBC
HCT: 24.8 % — ABNORMAL LOW (ref 36.0–46.0)
HEMATOCRIT: 28.5 % — AB (ref 36.0–46.0)
HEMOGLOBIN: 9.6 g/dL — AB (ref 12.0–15.0)
Hemoglobin: 8.7 g/dL — ABNORMAL LOW (ref 12.0–15.0)
MCH: 30 pg (ref 26.0–34.0)
MCH: 29.1 pg (ref 26.0–34.0)
MCHC: 35.1 g/dL (ref 30.0–36.0)
MCHC: 33.7 g/dL (ref 30.0–36.0)
MCV: 85.5 fL (ref 78.0–100.0)
MCV: 86.4 fL (ref 78.0–100.0)
PLATELETS: 43 10*3/uL — AB (ref 150–400)
PLATELETS: 51 10*3/uL — AB (ref 150–400)
RBC: 2.9 MIL/uL — AB (ref 3.87–5.11)
RBC: 3.3 MIL/uL — AB (ref 3.87–5.11)
RDW: 17.8 % — ABNORMAL HIGH (ref 11.5–15.5)
RDW: 18.3 % — ABNORMAL HIGH (ref 11.5–15.5)
WBC: 13.3 10*3/uL — AB (ref 4.0–10.5)
WBC: 17.2 10*3/uL — AB (ref 4.0–10.5)

## 2015-09-30 LAB — MAGNESIUM
MAGNESIUM: 1.8 mg/dL (ref 1.7–2.4)
Magnesium: 1.7 mg/dL (ref 1.7–2.4)

## 2015-09-30 LAB — PROTIME-INR
INR: 1.52 — AB (ref 0.00–1.49)
Prothrombin Time: 18.3 seconds — ABNORMAL HIGH (ref 11.6–15.2)

## 2015-09-30 LAB — HEPATITIS B CORE ANTIBODY, TOTAL: HEP B C TOTAL AB: NEGATIVE

## 2015-09-30 LAB — HEPATITIS B SURFACE ANTIGEN: HEP B S AG: NEGATIVE

## 2015-09-30 MED ORDER — PRISMASOL BGK 4/2.5 32-4-2.5 MEQ/L IV SOLN
INTRAVENOUS | Status: DC
  Administered 2015-09-30 – 2015-10-03 (×14): via INTRAVENOUS_CENTRAL
  Filled 2015-09-30 (×15): qty 5000

## 2015-09-30 MED ORDER — HEPARIN SODIUM (PORCINE) 1000 UNIT/ML DIALYSIS
1000.0000 [IU] | INTRAMUSCULAR | Status: DC | PRN
  Administered 2015-10-03: 2400 [IU] via INTRAVENOUS_CENTRAL
  Filled 2015-09-30: qty 3
  Filled 2015-09-30 (×2): qty 6

## 2015-09-30 MED ORDER — PRISMASOL BGK 4/2.5 32-4-2.5 MEQ/L IV SOLN
INTRAVENOUS | Status: DC
  Administered 2015-09-30 – 2015-10-03 (×11): via INTRAVENOUS_CENTRAL
  Filled 2015-09-30 (×15): qty 5000

## 2015-09-30 MED ORDER — SODIUM CHLORIDE 0.9 % FOR CRRT
INTRAVENOUS_CENTRAL | Status: DC | PRN
  Administered 2015-10-02: via INTRAVENOUS_CENTRAL
  Filled 2015-09-30 (×2): qty 1000

## 2015-09-30 MED ORDER — PRO-STAT SUGAR FREE PO LIQD
30.0000 mL | Freq: Two times a day (BID) | ORAL | Status: AC
  Administered 2015-09-30 (×2): 30 mL
  Filled 2015-09-30 (×2): qty 30

## 2015-09-30 MED ORDER — VITAL AF 1.2 CAL PO LIQD
1000.0000 mL | ORAL | Status: DC
  Administered 2015-09-30 – 2015-10-03 (×2): 1000 mL
  Filled 2015-09-30 (×7): qty 1000

## 2015-09-30 MED ORDER — PRISMASOL BGK 4/2.5 32-4-2.5 MEQ/L IV SOLN
INTRAVENOUS | Status: DC
  Administered 2015-09-30 – 2015-10-01 (×4): via INTRAVENOUS_CENTRAL
  Filled 2015-09-30 (×5): qty 5000

## 2015-09-30 MED ORDER — VITAL HIGH PROTEIN PO LIQD
1000.0000 mL | ORAL | Status: DC
  Administered 2015-09-30: 1000 mL
  Filled 2015-09-30 (×2): qty 1000

## 2015-09-30 NOTE — Progress Notes (Signed)
RT placed pt on full support with previous settings of 400,12,30%,+5 of peep. Patient tolerating well at this time. Vital signs stable. RT will continue to monitor.

## 2015-09-30 NOTE — Progress Notes (Signed)
   09/30/15 1300  Clinical Encounter Type  Visited With Patient and family together;Health care provider  Visit Type Initial;Psychological support;Spiritual support;Social support;Code;Critical Care;Trauma  Referral From Care management  Consult/Referral To None  Spiritual Encounters  Spiritual Needs Prayer;Emotional;Grief support  Stress Factors  Patient Stress Factors Family relationships;Lack of knowledge  Family Stress Factors Lack of knowledge  Advance Directives (For Healthcare)  Does patient have an advance directive? No   Chaplain visited with Pt. And Family. Chaplain gave emotional support... Chaplain will follow up later. Please page if you need Chaplain to come up sooner.

## 2015-09-30 NOTE — Progress Notes (Signed)
RT retracted pt ETT 2cm per MD order. Patient tolerated well. No complications. Vital signs stable throughout. RN and family at bedside. RT will continue to monitor.

## 2015-09-30 NOTE — Progress Notes (Signed)
[facility]  Subjective  - agitated but awake on vent   Objective 135/58 108 99.1 F (37.3 C) (Oral) 28 95%  Intake/Output Summary (Last 24 hours) at 09/30/15 1333 Last data filed at 09/30/15 0600  Gross per 24 hour  Intake 2115.51 ml  Output   2200 ml  Net -84.49 ml   Abdomen firm still with some serous and bloody drainage from wounds  Assessment/Planning: POD 3 from evacuation of retroperitoneal hematoma and repair of external iliac artery - renal failure most likely ATN from hypotension/DIC hopefully recover in the next few days - coagulapathy still with thrombocytopenia INR/hemoglobin drifting/still with GI blood loss as well but trending in the right direction - shock currently off pressors but required intermittently for dialysis - bladder pressures 25 at 1 am concerning for intraabdominal compartment syndrome but would be reluctant to open abdomen in current scenario of ongoing coagulapathy -continue supportive care  [name] Herman, [name] Herman 09/30/2015 1:33 PM --  Laboratory Lab Results:  Recent Labs  09/29/15 2300 09/30/15 0619  WBC 14.3* 13.3*  HGB 9.6* 8.7*  HCT 26.7* 24.8*  PLT 47* 43*   BMET  Recent Labs  09/29/15 1736 09/30/15 0619  NA 140 139  K 3.4* 3.4*  CL 100* 105  CO2 22 23  GLUCOSE 146* 131*  BUN 14 22*  CREATININE 2.10* 2.98*  CALCIUM 7.7* 6.6*    COAG Lab Results  Component Value Date   INR 1.52* 09/30/2015   INR 1.67* 09/29/2015   INR 1.66* 09/28/2015   No results found for: PTT

## 2015-09-30 NOTE — Progress Notes (Signed)
Nutrition Consult/Follow Up  DOCUMENTATION CODES:   Not applicable  INTERVENTION:    Initiate TF via OGT with Vital AF 1.2 at 25 ml/h and Prostat 30 ml BID on day 1; on day 2, d/c Prostat and increase to goal rate of 45 ml/h (1080 ml per day) to provide 1296 kcals, 81 gm protein, 876 ml free water daily.  NUTRITION DIAGNOSIS:   Inadequate oral intake related to inability to eat as evidenced by NPO status, ongoing  GOAL:   Patient will meet greater than or equal to 90% of their needs, progressing  MONITOR:   TF tolerance, Vent status, Labs, Weight trends, I & O's  ASSESSMENT:   80 year old female who presented to [facility] 10/11/2015 for iliac artery stent placement. Procedure was done, no stent needed. Post-operatively she complained of nausea and had hypotension. Distended abdomen. CT with with retroperitoneal blood, not actively bleeding per Dr. Bonnielee HaffHoss.  Patient is currently intubated on ventilator support  MV: 10.3 L/min Temp (24hrs), Avg:98.4 F (36.9 C), Min:97.9 F (36.6 C), Max:99.6 F (37.6 C)   CCM note reviewed.  GI bleeding appears to be slowing down.  Plan to transition to CVVHD today.  RD consulted for TF initiation & management.  Diet Order:   NPO  Skin:  Reviewed, no issues  Last BM:  11/2  Height:   Ht Readings from Last 1 Encounters:  10/02/2015 5' (1.524 m)    Weight:   Wt Readings from Last 1 Encounters:  09/30/15 141 lb 12.1 oz (64.3 kg)    Ideal Body Weight:  45.5 kg  BMI:  Body mass index is 27.68 kg/(m^2).  Estimated Nutritional Needs:   Kcal:  1389  Protein:  80-90 gm  Fluid:  1.5 L  EDUCATION NEEDS:   No education needs identified at this time  Maureen ChattersKatie Tacarra Justo, RD, LDN Pager #: [phone] After-Hours Pager #: [phone]

## 2015-09-30 NOTE — Progress Notes (Signed)
PULMONARY / CRITICAL CARE MEDICINE   Name: [name] Herman MRN: [id number] DOB: 1935-10-04    ADMISSION DATE:  Oct 03, 2015 CONSULTATION DATE:  2015-10-03  REFERRING MD :  Dr. Bonnielee Haff, IR  CHIEF COMPLAINT:  RP bleeding after IR procedure  INITIAL PRESENTATION: 80 year old female who presented to [facility] 10/03/2015 for iliac artery stent placement. Procedure was done, no stent needed. Post-operatively she complained of nausea and had hypotension. Distended abdomen. CT with with retroperitoneal blood, not actively bleeding per Dr. Bonnielee Haff. To ICU for PCCM management.   STUDIES:  CT (A) abd/pelvis 10-03-2023) >>> Large right retroperitoneal hemorrhage extending from the right inguinal region after angiogram. There is no active extravasation or bleeding. There is no pseudoaneurysm. Lower extremity arterial duplex 2023/10/03) No evidence of psuedoaneurysm in the right groin. A hematoma is noted measuring 3.55cm. CXR (11/3): No significant change in left lower lobe atelectasis or consolidation and mild bilateral perihilar atelectasis CXR (11/4): Endotracheal tube tip remains at the carina. Proximal repositioning of approximately 2 to 3 cm suggested. Remaining lines and tubes in stable position. Persistent bibasilar atelectasis. Mild infiltrate left lung base cannot be excluded.  SIGNIFICANT EVENTS: 10/03/23 to IR for iliac stent, stent not needed, post op RP blood. Shock. To ICU 2023/10/03 coded pea 10 min , OR for evacuation hematoma and iliac repair  11/2- GI bleeding, renal called 11/3- less bleeding from OGT, no pressors  SUBJECTIVE:  Patient sedated.  VITAL SIGNS: Temp:  [97.9 F (36.6 C)-98.8 F (37.1 C)] 98.7 F (37.1 C) (11/04 0315) Pulse Rate:  [54-102] 92 (11/04 0600) Resp:  [10-23] 11 (11/04 0600) BP: (99-150)/(47-70) 111/54 mmHg (11/04 0600) SpO2:  [92 %-100 %] 96 % (11/04 0600) Arterial Line BP: (95-136)/(40-61) 105/53 mmHg (11/04 0600) FiO2 (%):  [30 %-40 %] 30 % (11/04  0306) Weight:  [141 lb 12.1 oz (64.3 kg)] 141 lb 12.1 oz (64.3 kg) (11/04 0425) HEMODYNAMICS: CVP:  [15 mmHg] 15 mmHg VENTILATOR SETTINGS: Vent Mode:  [-] PRVC FiO2 (%):  [30 %-40 %] 30 % Set Rate:  [12 bmp-22 bmp] 12 bmp Vt Set:  [400 mL] 400 mL PEEP:  [5 cmH20] 5 cmH20 Pressure Support:  [14 cmH20] 14 cmH20 Plateau Pressure:  [16 cmH20-22 cmH20] 18 cmH20 INTAKE / OUTPUT:  Intake/Output Summary (Last 24 hours) at 09/30/15 0706 Last data filed at 09/30/15 0600  Gross per 24 hour  Intake 2755.93 ml  Output   2225 ml  Net 530.93 ml    PHYSICAL EXAMINATION: General:  Vented, sedated rass -1 Neuro:  Awakens, opens eyes, moves all ext equally, pupils sluggish, R pupil about 1 mm larger than Left HEENT: jvd up Cardiovascular:  s1 s2 RRR Lungs: cta, intubated Abdomen:  softer, BS hypoactive, oozing from abdominal incisions has slowed Musculoskeletal:  Upper ext edema rt dopplerable, left palpable Skin:  Necrotic appearing R toes.       LABS:  CBC  Recent Labs Lab 09/29/15 1735 09/29/15 2300 09/30/15 0619  WBC 17.6* 14.3* 13.3*  HGB 11.7* 9.6* 8.7*  HCT 33.4* 26.7* 24.8*  PLT 68* 47* 43*   Coag's  Recent Labs Lab 10/03/2015 1030 2015-10-03 1900 10/03/2015 2324  09/28/15 1805 09/29/15 0938 09/30/15 0619  APTT 21* 56* 118*  --   --   --   --   INR 0.96 2.07* 3.30*  < > 1.66* 1.67* 1.52*  < > = values in this interval not displayed. BMET  Recent Labs Lab 09/29/15 [phone] 09/29/15 0756 09/29/15 1736  NA 147* 149* 140  K 3.0* 3.0* 3.4*  CL 110 110 100*  CO2 BUN 29* 30* 14  CREATININE 3.19* 3.34* 2.10*  GLUCOSE 145* 148* 146*   Electrolytes  Recent Labs Lab 09/28/15 1615 09/28/15 2330 09/29/15 0519 09/29/15 0756 09/29/15 1735 09/29/15 1736  CALCIUM 7.3* 7.1* 7.0* 7.0*  --  7.7*  MG  --  1.7 1.6*  --  2.1  --   PHOS 6.2*  --  4.9*  --   --  3.7   Sepsis Markers  Recent Labs Lab 10/11/2015 1900 09/28/15 0121 09/28/15 0410  LATICACIDVEN  16.5* 14.3* 15.2*   ABG  Recent Labs Lab 09/28/15 1116 09/29/15 0710 09/30/15 0528  PHART 7.560* 7.514* 7.396  PCO2ART 22.8* 28.0* 40.6  PO2ART 135.0* 79.5* 80.0   Liver Enzymes  Recent Labs Lab Oct 11, 2015 1640 10-11-2015 1950  AST 27 123*  ALT 13* 121*  ALKPHOS 41 24*  BILITOT 0.7 0.5  ALBUMIN 2.6* 1.0*   Cardiac Enzymes  Recent Labs Lab 09/28/15 0113 09/28/15 0330 09/28/15 0731  TROPONINI 0.41* 0.34* 0.61*   Glucose  Recent Labs Lab 11-Oct-2015 1901 09/29/15 1119 09/29/15 1606 09/29/15 1957 09/29/15 2306 09/30/15 0305  GLUCAP 275* 148* 138* 137* 129* 145*    Imaging Dg Chest Port 1 View  09/29/2015  CLINICAL DATA:  Central line placement. Acute respiratory failure. Cardiac arrest. EXAM: PORTABLE CHEST 1 VIEW COMPARISON:  Prior today FINDINGS: A new right jugular central venous catheter is seen with tip overlying the superior cavoatrial junction. No pneumothorax visualized. Endotracheal tube tip is seen at the level of the carina. Nasogastric tube is seen entering the stomach. Persistent left lower lobe atelectasis or consolidation is again seen. Mild bilateral perihilar atelectasis is also stable in appearance. IMPRESSION: New right jugular central venous catheter in appropriate position. No pneumothorax visualized. Low endotracheal tube position, with tip at level of carina. No significant change in left lower lobe atelectasis or consolidation and mild bilateral perihilar atelectasis. These results were discussed by telephone at the time of interpretation on 09/29/2015 at 1:05 pm with Dr. Rory Percy , who verbally acknowledged these results. Electronically Signed   By: Myles Rosenthal M.D.   On: 09/29/2015 13:06   Dg Chest Port 1 View  09/29/2015  CLINICAL DATA:  Acute respiratory failure. Cardiac arrest. On ventilator. EXAM: PORTABLE CHEST 1 VIEW COMPARISON:  09/28/2015 FINDINGS: Endotracheal tube tip is now in the proximal right mainstem bronchus. Nasogastric tube  seen within the stomach. Left jugular Cordis remains in place. Low lung volumes are again noted. Mild increase in left lower lobe atelectasis or consolidation noted. Small left pleural effusion cannot be excluded. Bilateral perihilar atelectasis also mildly increased since prior study. No pneumothorax visualized. Heart size remains within normal limits. IMPRESSION: Low endotracheal tube position, with tip now overlying the proximal right mainstem bronchus. Mild increase in left lower lobe atelectasis or consolidation, and bilateral perihilar atelectasis. These results will be called to the ordering clinician or representative by the Radiologist Assistant, and communication documented in the PACS or zVision Dashboard. Electronically Signed   By: Myles Rosenthal M.D.   On: 09/29/2015 12:48     ASSESSMENT / PLAN:  PULMONARY A: COPD without acute exacerbation Acute resp failure, hypoxia High risk TRALI High risk uncompensated acidosis  pulm edema concerns  P:   Improved vent needs ABG reviewed, continue rate 12 Consider SBT today cpap 5 ps 5, would accept rr 35, tv 225 Move ETT back  2 cm Lasix per renal Concern pos balance again, discussed with renal.  Will start RRT today.  CARDIOVASCULAR CVL LIJ sheath 11/1 >>>  ALINE left fem 11/1>>> A:  Hemorrhagic shock resolved Blue toe syndrome PAD H/o HTN Trop leak, s/p cpr CVVHD, -2 L P:  May need levo for HD MAP wnl on own Follow CBC, Hgb 8.7 from 9.6 Transfusion threshold hgb 7.0 RRT as above  RENAL A:   CKD Acute renal failure, ATN, post arrest , shock hypoK At risk abdo compartment, bladder pressure lower Hypernatremia Hypokalemia, 3.4 P:   BMET q12h CVVHD, -2L during but still pos 14 liters in 48 hrs Will discontinue D5W since getting HD/ RRT Replete electrolytes PRN  GASTROINTESTINAL A:   H/o PUD Active Gi bleed, likely all related to coags, fibrinogen, plat etc Improved bleeding Abdominal pressure 28 P:   Start TF  this am PPI drip Low suction NGT discontinued Neg balance for badder pressure also important s/p massive resus  HEMATOLOGIC Total: PRBC 15 FFP 3 Cryo 11 A:   Acute retroperitoneal hemorrhage s/p IR arteriogram  Consumptive / dilutional coagulapthy DIC improved with cryo P:  CBC to q12h S/p 1 unit prbc with HD 11/3 Follow CBC, platelets 43 from 47 PT 18.3, INR 1.52, repeat coags in am  INFECTIOUS A:   Afebrile, No acute issues  P:   Off all abx No fevers  ENDOCRINE A:   Chronic prednisone for polymyalgia rheumatica GI bleed P:   Stress dose steroids, continue Follow glucose on Bmet SSI D5W discontinued  NEUROLOGIC A:   Acute metabolic encephalopathy up and down R/o anoxia, post arrest P:   RASS goal: 0 Monitor closely Low dose fent, consider to int Avoid benzo  FAMILY  - Updates: family updated in full today.  - Inter-disciplinary family meet or Palliative Care meeting due by:  11/7  Ashly M. Nadine CountsGottschalk, DO PGY-2, [facility]

## 2015-09-30 NOTE — Progress Notes (Signed)
Patient ID: [name] FlamingMartha H Hensch, female   DOB: 01/21/1935, 80 y.o.   MRN: [id number]      History of Present Illness: [name] Herman is a 80 y.o. female S/P 3 days out from retroperitoneal hemorrhage and surgical evacuation. GI bleeding seems to have subsided and she is getting tube feeds. Currently, she is still undergoing dialysis for fluid removal. Her most recent abdominal pressure is 20 mm Hg which is improved. UOP is still minimal. She did undergo some weaning trials from the vent early today successfully.       Review of Systems  Vital Signs: BP 120/58 mmHg  Pulse 83  Temp(Src) 97.7 F (36.5 C) (Oral)  Resp 18  Ht 5' (1.524 m)  Wt 141 lb 12.1 oz (64.3 kg)  BMI 27.68 kg/m2  SpO2 97%  Physical Exam   She is still ventilated and sedated. Abdomen is still distended with some bloody ooze from the dressing. The toes of her right foot are worsening, now with discoloration of the third toe, in addition to the 2nd and fourth toes.  She is still somewhat delirious and does not follow commands.  Imaging: Ir Angiogram Extremity Right  09/29/2015  CLINICAL DATA:  Right toe gangrene EXAM: RIGHT EXTREMITY ARTERIOGRAPHY; IR ULTRASOUND GUIDANCE VASC ACCESS RIGHT FLUOROSCOPY TIME:  7 minutes and 18 seconds MEDICATIONS AND MEDICAL HISTORY: Versed 1 mg, Fentanyl 50 mcg. Additional Medications: 5000 units of heparin. ANESTHESIA/SEDATION: Moderate sedation time: 40 minutes CONTRAST:  65 cc Visipaque 320 PROCEDURE: The procedure, risks, benefits, and alternatives were explained to the patient. Questions regarding the procedure were encouraged and answered. The patient understands and consents to the procedure. The right groin was prepped with Betadine in a sterile fashion, and a sterile drape was applied covering the operative field. A sterile gown and sterile gloves were used for the procedure. Under sonographic guidance, a micropuncture needle was inserted into the right common femoral artery and  removed over a 018 wire. A 4 French transitional dilator was inserted for a right lower extremity runoff. Insertion into the artery was performed at the mid femoral head prosthetic component. The transitional dilator was then exchanged for a 5 French sheath. A Kumpe be catheter was carefully advanced over a glidewire into the abdominal aorta. Initial attempts were made with a Bentson wire unsuccessfully. After some time, access across the common iliac artery lesion was obtained. The copy was exchanged over a Bentson wire for a Omni Flush catheter and iliac angiography was performed. The Omni Flush was then exchanged for the copy catheter again over the Bentson wire. Pressure measurements were obtained utilizing a pull back technique from the aorta to the external iliac artery. Mean arterial pressure in the aorta is 108 mm Hg and mean pressure in the external iliac artery was 104 mm Hg for a gradient of 4 mm Hg. The 5 French sheath was removed and hemostasis was achieved with direct pressure. At least 30 minutes had transpired after heparin administration. Exo seal was not utilized secondary to close proximity of the sheath to a femoral artery branch. There was no evidence of groin hematoma or bleeding from the entry site during the time of compression. Compression was held for approximately 20 minutes. After the patient was transferred to the outpatient recovery area up stairs, she developed hypotension and was immediately rushed to the CT scan for imaging. This demonstrated a large retroperitoneal hemorrhage without active bleeding. She was resuscitated in the CT scan suite by the critical care physician  and myself. FINDINGS: Right lower extremity runoff demonstrates patency of the common femoral, profunda femoral, superficial femoral, and popliteal arteries. The posterior tibial artery opacifies to the ankle. There is very slow flow in the peroneal and anterior tibial arteries which eventually do reach the ankle.  Right iliac angiography demonstrates irregular plaque in the common femoral artery. As described above, a small pressure gradient across this area was determined. COMPLICATIONS: The patient suffered a retroperitoneal hemorrhage after she was transferred to the outpatient area. IMPRESSION: Evaluation of the right common iliac artery lesion demonstrates irregular plaque but only a small pressure gradient. Stenting was not performed at this time. Optimal treatment will remain as anti-platelet and lipid lowering medication therapy. The patient did suffer a retroperitoneal hemorrhage after she was transferred to the outpatient recovery area. She was resuscitated and transferred to the ICU. Electronically Signed   By: Jolaine Click M.D.   On: 09/29/2015 09:57   Ir US Guide Vasc Access Right  09/29/2015  CLINICAL DATA:  Right toe gangrene EXAM: RIGHT EXTREMITY ARTERIOGRAPHY; IR ULTRASOUND GUIDANCE VASC ACCESS RIGHT FLUOROSCOPY TIME:  7 minutes and 18 seconds MEDICATIONS AND MEDICAL HISTORY: Versed 1 mg, Fentanyl 50 mcg. Additional Medications: 5000 units of heparin. ANESTHESIA/SEDATION: Moderate sedation time: 40 minutes CONTRAST:  65 cc Visipaque 320 PROCEDURE: The procedure, risks, benefits, and alternatives were explained to the patient. Questions regarding the procedure were encouraged and answered. The patient understands and consents to the procedure. The right groin was prepped with Betadine in a sterile fashion, and a sterile drape was applied covering the operative field. A sterile gown and sterile gloves were used for the procedure. Under sonographic guidance, a micropuncture needle was inserted into the right common femoral artery and removed over a 018 wire. A 4 French transitional dilator was inserted for a right lower extremity runoff. Insertion into the artery was performed at the mid femoral head prosthetic component. The transitional dilator was then exchanged for a 5 French sheath. A Kumpe be catheter  was carefully advanced over a glidewire into the abdominal aorta. Initial attempts were made with a Bentson wire unsuccessfully. After some time, access across the common iliac artery lesion was obtained. The copy was exchanged over a Bentson wire for a Omni Flush catheter and iliac angiography was performed. The Omni Flush was then exchanged for the copy catheter again over the Bentson wire. Pressure measurements were obtained utilizing a pull back technique from the aorta to the external iliac artery. Mean arterial pressure in the aorta is 108 mm Hg and mean pressure in the external iliac artery was 104 mm Hg for a gradient of 4 mm Hg. The 5 French sheath was removed and hemostasis was achieved with direct pressure. At least 30 minutes had transpired after heparin administration. Exo seal was not utilized secondary to close proximity of the sheath to a femoral artery branch. There was no evidence of groin hematoma or bleeding from the entry site during the time of compression. Compression was held for approximately 20 minutes. After the patient was transferred to the outpatient recovery area up stairs, she developed hypotension and was immediately rushed to the CT scan for imaging. This demonstrated a large retroperitoneal hemorrhage without active bleeding. She was resuscitated in the CT scan suite by the critical care physician and myself. FINDINGS: Right lower extremity runoff demonstrates patency of the common femoral, profunda femoral, superficial femoral, and popliteal arteries. The posterior tibial artery opacifies to the ankle. There is very slow flow in the  peroneal and anterior tibial arteries which eventually do reach the ankle. Right iliac angiography demonstrates irregular plaque in the common femoral artery. As described above, a small pressure gradient across this area was determined. COMPLICATIONS: The patient suffered a retroperitoneal hemorrhage after she was transferred to the outpatient area.  IMPRESSION: Evaluation of the right common iliac artery lesion demonstrates irregular plaque but only a small pressure gradient. Stenting was not performed at this time. Optimal treatment will remain as anti-platelet and lipid lowering medication therapy. The patient did suffer a retroperitoneal hemorrhage after she was transferred to the outpatient recovery area. She was resuscitated and transferred to the ICU. Electronically Signed   By: Jolaine Click M.D.   On: 09/29/2015 09:57   Dg Chest Port 1 View  09/30/2015  CLINICAL DATA:  Acute respiratory failure. EXAM: PORTABLE CHEST 1 VIEW COMPARISON:  10/26/2015. FINDINGS: Endotracheal tube tip again noted at the carina. Proximal repositioning suggested. Bilateral IJ lines, NG tube in stable position. Mediastinum hilar structures are normal. Heart size stable. Low lung volumes with bibasilar atelectasis. Mild infiltrate left lung base cannot be excluded. Small left pleural effusion cannot be excluded. No pneumothorax . IMPRESSION: 1. Endotracheal tube tip remains at the carina. Proximal repositioning of approximately 2 to 3 cm suggested. Remaining lines and tubes in stable position. 2. Persistent bibasilar atelectasis. Mild infiltrate left lung base cannot be excluded. Critical Value/emergent results were called by telephone at the time of interpretation on 09/30/2015 at 7:22 am to nurse Abby, who verbally acknowledged these results. Electronically Signed   By: Maisie Fus  Register   On: 09/30/2015 07:23   Dg Chest Port 1 View  09/29/2015  CLINICAL DATA:  Central line placement. Acute respiratory failure. Cardiac arrest. EXAM: PORTABLE CHEST 1 VIEW COMPARISON:  Prior today FINDINGS: A new right jugular central venous catheter is seen with tip overlying the superior cavoatrial junction. No pneumothorax visualized. Endotracheal tube tip is seen at the level of the carina. Nasogastric tube is seen entering the stomach. Persistent left lower lobe atelectasis or  consolidation is again seen. Mild bilateral perihilar atelectasis is also stable in appearance. IMPRESSION: New right jugular central venous catheter in appropriate position. No pneumothorax visualized. Low endotracheal tube position, with tip at level of carina. No significant change in left lower lobe atelectasis or consolidation and mild bilateral perihilar atelectasis. These results were discussed by telephone at the time of interpretation on 09/29/2015 at 1:05 pm with Dr. Rory Percy , who verbally acknowledged these results. Electronically Signed   By: Myles Rosenthal M.D.   On: 09/29/2015 13:06   Dg Chest Port 1 View  09/29/2015  CLINICAL DATA:  Acute respiratory failure. Cardiac arrest. On ventilator. EXAM: PORTABLE CHEST 1 VIEW COMPARISON:  09/28/2015 FINDINGS: Endotracheal tube tip is now in the proximal right mainstem bronchus. Nasogastric tube seen within the stomach. Left jugular Cordis remains in place. Low lung volumes are again noted. Mild increase in left lower lobe atelectasis or consolidation noted. Small left pleural effusion cannot be excluded. Bilateral perihilar atelectasis also mildly increased since prior study. No pneumothorax visualized. Heart size remains within normal limits. IMPRESSION: Low endotracheal tube position, with tip now overlying the proximal right mainstem bronchus. Mild increase in left lower lobe atelectasis or consolidation, and bilateral perihilar atelectasis. These results will be called to the ordering clinician or representative by the Radiologist Assistant, and communication documented in the PACS or zVision Dashboard. Electronically Signed   By: Myles Rosenthal M.D.   On: 09/29/2015 12:48  Dg Chest Port 1 View  09/28/2015  CLINICAL DATA:  Bedside central venous catheter placement. Ventilator dependent respiratory failure. Followup left pleural effusion. EXAM: PORTABLE CHEST 1 VIEW 1625 hr: COMPARISON:  Chest x-ray earlier same 1053 hr and previously. FINDINGS:  Left jugular central venous catheter tip projects at or near the cavoatrial junction. No evidence of pneumothorax or mediastinal hematoma. Endotracheal tube tip remains in satisfactory position approximately 3 cm above the carina. Nasogastric tube tip in the stomach. Small to moderate-sized left pleural effusion and associated consolidation of the left lower lobe, unchanged since earlier in the day. Suboptimal inspiration which accounts for atelectasis at the right lung base. IMPRESSION: 1. Left jugular central venous catheter tip projects at or near the cavoatrial junction. No complicating features. 2. Stable small moderate size left pleural effusion and associated atelectasis versus pneumonia in the left lower lobe. 3. Suboptimal inspiration accounts for right basilar atelectasis. Electronically Signed   By: Hulan Saas M.D.   On: 09/28/2015 16:34   Dg Chest Port 1 View  09/28/2015  CLINICAL DATA:  Respiratory distress.  Pulmonary edema. EXAM: PORTABLE CHEST 1 VIEW COMPARISON:  October 27, 2015 FINDINGS: Endotracheal tube is been inserted and is in good position. NG tube has been inserted and the tip is in the stomach. Central venous sheath tip is in the proximal left innominate vein, unchanged. Heart size and pulmonary vascularity are normal. New small left pleural effusion and probable slight atelectasis at the left base. IMPRESSION: New small left effusion and atelectasis at the left lung base. Tubes in good position. Electronically Signed   By: Francene Boyers M.D.   On: 09/28/2015 11:21   Dg Chest Port 1 View  10/27/2015  CLINICAL DATA:  Code blue.  Endotracheal tube placement. EXAM: PORTABLE CHEST 1 VIEW COMPARISON:  02/09/2015 FINDINGS: Endotracheal tube placed with tip measuring 2.6 cm above the carina. There appears to be a central vascular sheath over the left jugular vein with tip projected over the upper mediastinum. This could be localized in the brachiocephalic vein or arterial structure. Normal  heart size and pulmonary vascularity. Shallow inspiration. Slight atelectasis in the left lung base. No focal consolidation. No pneumothorax. No blunting of costophrenic angles. Acutea right and left lower rib fractures. IMPRESSION: Appliances positioned as described. Shallow inspiration. Acute rib fractures. No pneumothorax or focal consolidation in the lungs. Electronically Signed   By: Burman Nieves M.D.   On: 2015/10/27 20:18   Dg Chest Port 1 View  Oct 27, 2015  CLINICAL DATA:  Bedside central venous catheter placement. Current history of blue toe syndrome EXAM: PORTABLE CHEST 1 VIEW COMPARISON:  02/09/2015 and earlier. FINDINGS: Left jugular introducer sheath tip projects over the left innominate vein. No evidence of pneumothorax or mediastinal hematoma. Suboptimal inspiration. Cardiac silhouette normal in size, unchanged. Prominent bronchovascular markings diffusely and central peribronchial thickening, unchanged, with likely bronchiectasis in the right lower lobe. Lungs otherwise clear. No pleural effusions. IMPRESSION: 1. Left jugular introducer sheath tip projects over the left innominate vein. 2. Suboptimal inspiration.  No acute cardiopulmonary disease. Electronically Signed   By: Hulan Saas M.D.   On: October 27, 2015 16:09   Dg Abd Portable 1v  09/29/2015  CLINICAL DATA:  Abdominal bleed. EXAM: PORTABLE ABDOMEN - 1 VIEW COMPARISON:  CT 2015-10-27. FINDINGS: NG tube noted with tip projected over the stomach. No bowel distention. No free air. Degenerative changes scoliosis lumbar spine. Right hip replacement. Surgical staples over the right abdomen. Bibasilar subsegmental atelectasis. IMPRESSION: NG tube tip noted projected over  the stomach. No bowel distention. No free air. Electronically Signed   By: Maisie Fus  Register   On: 09/29/2015 07:00   Ct Angio Abd/pel W/ And/or W/o  09/29/2015  CLINICAL DATA:  Hypotension after angiogram EXAM: CTA ABDOMEN AND PELVIS wITHOUT AND WITH CONTRAST TECHNIQUE:  Multidetector CT imaging of the abdomen and pelvis was performed using the standard protocol during bolus administration of intravenous contrast. Multiplanar reconstructed images and MIPs were obtained and reviewed to evaluate the vascular anatomy. CONTRAST:  100 cc Omnipaque 350 COMPARISON:  None. FINDINGS: There is a large retroperitoneal hemorrhage extending from the right inguinal region into the right retroperitoneum towards the right upper quadrant. Contrast images demonstrate no pseudoaneurysm or active contrast extravasation. Plaque along the right common iliac artery is again noted. There is no dissection or focal thrombosis. Atherosclerotic changes of the celiac origin SMA origin, and bilateral renal arteries are stable. IMA is diminutive and patent. There is a small amount of hemoperitoneum about the liver. The bladder is deviated to the left secondary to the retroperitoneal hemorrhage. Right total hip arthroplasty is noted. Kidneys are within normal limits. Spleen pancreas and adrenal glands are unremarkable Several cysts throughout the liver are noted. Review of the MIP images confirms the above findings. IMPRESSION: Large right retroperitoneal hemorrhage extending from the right inguinal region after angiogram. There is no active extravasation or bleeding. There is no pseudoaneurysm. Electronically Signed   By: Jolaine Click M.D.   On: 10/24/2015 17:53    Labs:  CBC:  Recent Labs  09/29/15 1735 09/29/15 2300 09/30/15 0619 09/30/15 1535  WBC 17.6* 14.3* 13.3* 17.2*  HGB 11.7* 9.6* 8.7* 9.6*  HCT 33.4* 26.7* 24.8* 28.5*  PLT 68* 47* 43* 51*    COAGS:  Recent Labs  10/22/2015 1030 10/04/2015 1900 10/19/2015 2324  09/28/15 0842 09/28/15 1805 09/29/15 0938 09/30/15 0619  INR 0.96 2.07* 3.30*  < > 1.72* 1.66* 1.67* 1.52*  APTT 21* 56* 118*  --   --   --   --   --   < > = values in this interval not displayed.  BMP:  Recent Labs  09/29/15 0756 09/29/15 1736 09/30/15 0619  09/30/15 1535  NA 149* 140 139 140  K 3.0* 3.4* 3.4* 4.1  CL 110 100* 105 102  CO2 23 22 23 25   GLUCOSE 148* 146* 131* 124*  BUN 30* 14 22* 23*  CALCIUM 7.0* 7.7* 6.6* 7.3*  CREATININE 3.34* 2.10* 2.98* 3.01*  GFRNONAA 12* 21* 14* 14*  GFRAA 14* 25* 16* 16*    LIVER FUNCTION TESTS:  Recent Labs  08/24/15 0818 10/11/2015 1640 10/18/2015 1950 09/30/15 0619 09/30/15 1535  BILITOT 0.3 0.7 0.5 2.2*  --   AST 16 27 123* 609*  --   ALT 12 13* 121* 232*  --   ALKPHOS 61 41 24* 53  --   PROT 6.6 4.6* <3.0* 4.6*  --   ALBUMIN 3.7 2.6* 1.0* 2.3* 2.7*    TUMOR MARKERS: No results for input(s): AFPTM, CEA, CA199, CHROMGRNA in the last 8760 hours.  Assessment and Plan:  3 days s/p retroperitoneal hemorrhage evacuation, she is still anuric but bleeding is hopefully slowing down. GI bleeding is hopefully resolved and she is getting feeds. We can hopefully move to extubation in a few days.   Signed: Lella Mullany, ART A 09/30/2015, 4:47 PM

## 2015-09-30 NOTE — Progress Notes (Signed)
Admit: 10/20/2015 LOS: 3  55F w/ anuric ATN 2/2 hemorrhagic shock, PEA arrest, and contrast nephropathy  Subjective:  Tolerated intermittent HD yesterday, 2 L UF; however, did require vaso-suppressor support during the treatment This morning much improved from a respiratory standpoint but not following commands, unable to extubate  11/03 0701 - 11/04 0700 In: 2755.9 [I.V.:2310.9; Blood:335; NG/GT:30] Out: 2225 [Urine:120; Stool:100]  Filed Weights   10/25/2015 0938 09/29/15 0200 09/30/15 0425  Weight: 46.267 kg (102 lb) 66.6 kg (146 lb 13.2 oz) 64.3 kg (141 lb 12.1 oz)    Scheduled Meds: . sodium chloride   Intravenous Once  . antiseptic oral rinse  7 mL Mouth Rinse QID  . budesonide  0.25 mg Nebulization Q12H  . chlorhexidine gluconate  15 mL Mouth Rinse BID  . feeding supplement (VITAL HIGH PROTEIN)  1,000 mL Per Tube Q24H  . fentaNYL (SUBLIMAZE) injection  50 mcg Intravenous Once  . hydrocortisone sod succinate (SOLU-CORTEF) inj  50 mg Intravenous Q6H  . insulin aspart  0-9 Units Subcutaneous 6 times per day  . ipratropium-albuterol  3 mL Nebulization Q4H  . pantoprazole (PROTONIX) IV  80 mg Intravenous Once  . [START ON 10/01/2015] pantoprazole (PROTONIX) IV  40 mg Intravenous Q12H   Continuous Infusions: . fentaNYL infusion INTRAVENOUS 100 mcg/hr (09/30/15 0712)  . furosemide (LASIX) infusion Stopped (09/29/15 1200)  . norepinephrine (LEVOPHED) Adult infusion Stopped (09/29/15 1825)  . pantoprozole (PROTONIX) infusion 8 mg/hr (09/30/15 0711)  . dialysis replacement fluid (prismasate)    . dialysis replacement fluid (prismasate)    . dialysate (PRISMASATE)     PRN Meds:.fentaNYL, fentaNYL (SUBLIMAZE) injection, heparin, iodixanol, ondansetron (ZOFRAN) IV, sodium chloride  Current Labs: reviewed    Physical Exam:  Blood pressure 135/58, pulse 108, temperature 99.6 F (37.6 C), temperature source Oral, resp. rate 28, height 5' (1.524 m), weight 64.3 kg (141 lb 12.1 oz),  SpO2 95 %. GEN: Intubated and agitated ENT: ET tube in place . Left neck with Cordis catheter EYES: Eyes closed CV: RRR PULM: Coarse bilaterally ABD: Midline surgical wound bandaged, soft SKIN: Ischemic changes in the distal right lower extremity noted  A/P 1. Anuric AKI 2/2 ATN ? Abdominal compartment syndrome 1. Baseline SCr ~1.1 2. Foley catheter in place; CT A/P 11/1 w unremarkable kidney appearance 3. Start CRRT for ultrafiltration and to improve fluid balance; family updated; currently no heparin 4. Daily weights, Daily Renal Panel, Strict I/Os, Avoid nephrotoxins (NSAIDs, judicious IV Contrast) 1. Hemorrhagic Shock: off pressors, trending down, per CCM 2. VDRF per CCM 3. RP Hematoma s/p evaculation and arterial repair 4. PAD 5. HTN off meds, not on ACEi as outpt  Sabra Heckyan Nyree Yonker MD 09/30/2015, 12:42 PM   Recent Labs Lab 09/29/15 0519 09/29/15 0756 09/29/15 1736 09/30/15 0619  NA 147* 149* 140 139  K 3.0* 3.0* 3.4* 3.4*  CL 110 110 100* 105  CO2 22 23 22 23   GLUCOSE 145* 148* 146* 131*  BUN 29* 30* 14 22*  CREATININE 3.19* 3.34* 2.10* 2.98*  CALCIUM 7.0* 7.0* 7.7* 6.6*  PHOS 4.9*  --  3.7 3.3    Recent Labs Lab 10/26/2015 1640 10/22/2015 1900  09/29/15 1735 09/29/15 2300 09/30/15 0619  WBC 27.1* 13.8*  < > 17.6* 14.3* 13.3*  NEUTROABS 21.9* 10.6*  --   --   --   --   HGB 12.7 5.6*  < > 11.7* 9.6* 8.7*  HCT 37.2 17.0*  < > 33.4* 26.7* 24.8*  MCV 88.6 89.5  < >  83.7 84.8 85.5  PLT 216 115*  < > 68* 47* 43*  < > = values in this interval not displayed.

## 2015-10-01 ENCOUNTER — Inpatient Hospital Stay (HOSPITAL_COMMUNITY): Payer: Commercial Managed Care - HMO

## 2015-10-01 LAB — COMPREHENSIVE METABOLIC PANEL
ALBUMIN: 2.9 g/dL — AB (ref 3.5–5.0)
ALK PHOS: 74 U/L (ref 38–126)
ALT: 183 U/L — AB (ref 14–54)
ANION GAP: 9 (ref 5–15)
AST: 351 U/L — ABNORMAL HIGH (ref 15–41)
BILIRUBIN TOTAL: 5.1 mg/dL — AB (ref 0.3–1.2)
BUN: 19 mg/dL (ref 6–20)
CO2: 25 mmol/L (ref 22–32)
CREATININE: 1.87 mg/dL — AB (ref 0.44–1.00)
Calcium: 7.4 mg/dL — ABNORMAL LOW (ref 8.9–10.3)
Chloride: 102 mmol/L (ref 101–111)
GFR calc Af Amer: 29 mL/min — ABNORMAL LOW (ref >60)
GFR calc non Af Amer: 25 mL/min — ABNORMAL LOW (ref >60)
GLUCOSE: 115 mg/dL — AB (ref 65–99)
Potassium: 3.9 mmol/L (ref 3.5–5.1)
Sodium: 136 mmol/L (ref 135–145)
TOTAL PROTEIN: 5.8 g/dL — AB (ref 6.5–8.1)

## 2015-10-01 LAB — BLOOD GAS, ARTERIAL
Acid-base deficit: 1.9 mmol/L (ref 0.0–2.0)
Bicarbonate: 23 mEq/L (ref 20.0–24.0)
DRAWN BY: 437071
FIO2: 0.3
MECHVT: 400 mL
O2 SAT: 96.7 %
PEEP: 5 cmH2O
PH ART: 7.337 — AB (ref 7.350–7.450)
PO2 ART: 92.1 mmHg (ref 80.0–100.0)
Patient temperature: 98.6
RATE: 12 resp/min
TCO2: 24.4 mmol/L (ref 0–100)
pCO2 arterial: 44.1 mmHg (ref 35.0–45.0)

## 2015-10-01 LAB — GLUCOSE, CAPILLARY
GLUCOSE-CAPILLARY: 105 mg/dL — AB (ref 65–99)
GLUCOSE-CAPILLARY: 98 mg/dL (ref 65–99)
Glucose-Capillary: 102 mg/dL — ABNORMAL HIGH (ref 65–99)
Glucose-Capillary: 77 mg/dL (ref 65–99)
Glucose-Capillary: 78 mg/dL (ref 65–99)
Glucose-Capillary: 80 mg/dL (ref 65–99)
Glucose-Capillary: 94 mg/dL (ref 65–99)

## 2015-10-01 LAB — RENAL FUNCTION PANEL
ALBUMIN: 2.7 g/dL — AB (ref 3.5–5.0)
Anion gap: 8 (ref 5–15)
BUN: 17 mg/dL (ref 6–20)
CALCIUM: 7.4 mg/dL — AB (ref 8.9–10.3)
CO2: 25 mmol/L (ref 22–32)
Chloride: 104 mmol/L (ref 101–111)
Creatinine, Ser: 1.5 mg/dL — ABNORMAL HIGH (ref 0.44–1.00)
GFR calc Af Amer: 37 mL/min — ABNORMAL LOW (ref >60)
GFR calc non Af Amer: 32 mL/min — ABNORMAL LOW (ref >60)
GLUCOSE: 100 mg/dL — AB (ref 65–99)
PHOSPHORUS: 2.4 mg/dL — AB (ref 2.5–4.6)
POTASSIUM: 4.1 mmol/L (ref 3.5–5.1)
SODIUM: 137 mmol/L (ref 135–145)

## 2015-10-01 LAB — CBC
HCT: 30.3 % — ABNORMAL LOW (ref 36.0–46.0)
HCT: 29.8 % — ABNORMAL LOW (ref 36.0–46.0)
HCT: 28.3 % — ABNORMAL LOW (ref 36.0–46.0)
HEMOGLOBIN: 10.3 g/dL — AB (ref 12.0–15.0)
HEMOGLOBIN: 9.4 g/dL — AB (ref 12.0–15.0)
Hemoglobin: 10 g/dL — ABNORMAL LOW (ref 12.0–15.0)
MCH: 29.6 pg (ref 26.0–34.0)
MCH: 29.4 pg (ref 26.0–34.0)
MCH: 29.2 pg (ref 26.0–34.0)
MCHC: 34 g/dL (ref 30.0–36.0)
MCHC: 33.6 g/dL (ref 30.0–36.0)
MCHC: 33.2 g/dL (ref 30.0–36.0)
MCV: 87.1 fL (ref 78.0–100.0)
MCV: 87.6 fL (ref 78.0–100.0)
MCV: 87.9 fL (ref 78.0–100.0)
PLATELETS: 59 10*3/uL — AB (ref 150–400)
PLATELETS: 54 10*3/uL — AB (ref 150–400)
PLATELETS: 54 10*3/uL — AB (ref 150–400)
RBC: 3.48 MIL/uL — AB (ref 3.87–5.11)
RBC: 3.4 MIL/uL — AB (ref 3.87–5.11)
RBC: 3.22 MIL/uL — AB (ref 3.87–5.11)
RDW: 18.3 % — ABNORMAL HIGH (ref 11.5–15.5)
RDW: 18.4 % — ABNORMAL HIGH (ref 11.5–15.5)
RDW: 18.1 % — ABNORMAL HIGH (ref 11.5–15.5)
WBC: 18.9 10*3/uL — ABNORMAL HIGH (ref 4.0–10.5)
WBC: 18.1 10*3/uL — AB (ref 4.0–10.5)
WBC: 17 10*3/uL — AB (ref 4.0–10.5)

## 2015-10-01 LAB — CK TOTAL AND CKMB (NOT AT ARMC)
CK, MB: 36.5 ng/mL — AB (ref 0.5–5.0)
RELATIVE INDEX: 1.7 (ref 0.0–2.5)
Total CK: 2174 U/L — ABNORMAL HIGH (ref 38–234)

## 2015-10-01 LAB — MAGNESIUM
MAGNESIUM: 2.2 mg/dL (ref 1.7–2.4)
MAGNESIUM: 2.4 mg/dL (ref 1.7–2.4)

## 2015-10-01 LAB — LACTIC ACID, PLASMA: Lactic Acid, Venous: 1.6 mmol/L (ref 0.5–2.0)

## 2015-10-01 LAB — PROTIME-INR
INR: 1.35 (ref 0.00–1.49)
Prothrombin Time: 16.8 seconds — ABNORMAL HIGH (ref 11.6–15.2)

## 2015-10-01 LAB — PHOSPHORUS: PHOSPHORUS: 2.5 mg/dL (ref 2.5–4.6)

## 2015-10-01 MED ORDER — SODIUM CHLORIDE 0.9 % IV SOLN: INTRAVENOUS | Status: DC

## 2015-10-01 NOTE — Accreditation Note (Signed)
Dark red liquid oozing out of pt's mouth.  NGT residual checked.  700ccs out.  Dr. Kendrick FriesMcQuaid made aware.  Orders to not refeed residual and to stop TF at this time.

## 2015-10-01 NOTE — Progress Notes (Signed)
Subjective: Interval History: none.. Sedated on vent.  Objective: Vital signs in last 24 hours: Temp:  [93.2 F (34 C)-99.1 F (37.3 C)] 97.4 F (36.3 C) (11/05 0829) Pulse Rate:  [46-134] 92 (11/05 0736) Resp:  [9-28] 15 (11/05 0736) BP: (100-135)/(46-90) 111/49 mmHg (11/05 0736) SpO2:  [64 %-100 %] 100 % (11/05 0736) Arterial Line BP: (94-150)/(43-72) 114/50 mmHg (11/05 0700) FiO2 (%):  [30 %] 30 % (11/05 0736) Weight:  [139 lb 1.8 oz (63.1 kg)] 139 lb 1.8 oz (63.1 kg) (11/05 0600)  Intake/Output from previous day: 11/04 0701 - 11/05 0700 In: 1986.8 [I.V.:880.9; NG/GT:715.8] Out: 4645 [Urine:298; Emesis/NG output:700; Stool:60] Intake/Output this shift:    Abdomen distended but soft. Incision healing. No change in dry gangrenous changes on the right foot toes.  Lab Results:  Recent Labs  10/01/15 0130 10/01/15 0400  WBC 18.9* 18.1*  HGB 10.3* 10.0*  HCT 30.3* 29.8*  PLT 59* 54*   BMET  Recent Labs  09/30/15 1535 10/01/15 0400  NA 140 136  K 4.1 3.9  CL 102 102  CO2 25 25  GLUCOSE 124* 115*  BUN 23* 19  CREATININE 3.01* 1.87*  CALCIUM 7.3* 7.4*    Studies/Results: Ir Angiogram Extremity Right  09/29/2015  CLINICAL DATA:  Right toe gangrene EXAM: RIGHT EXTREMITY ARTERIOGRAPHY; IR ULTRASOUND GUIDANCE VASC ACCESS RIGHT FLUOROSCOPY TIME:  7 minutes and 18 seconds MEDICATIONS AND MEDICAL HISTORY: Versed 1 mg, Fentanyl 50 mcg. Additional Medications: 5000 units of heparin. ANESTHESIA/SEDATION: Moderate sedation time: 40 minutes CONTRAST:  65 cc Visipaque 320 PROCEDURE: The procedure, risks, benefits, and alternatives were explained to the patient. Questions regarding the procedure were encouraged and answered. The patient understands and consents to the procedure. The right groin was prepped with Betadine in a sterile fashion, and a sterile drape was applied covering the operative field. A sterile gown and sterile gloves were used for the procedure. Under sonographic  guidance, a micropuncture needle was inserted into the right common femoral artery and removed over a 018 wire. A 4 French transitional dilator was inserted for a right lower extremity runoff. Insertion into the artery was performed at the mid femoral head prosthetic component. The transitional dilator was then exchanged for a 5 French sheath. A Kumpe be catheter was carefully advanced over a glidewire into the abdominal aorta. Initial attempts were made with a Bentson wire unsuccessfully. After some time, access across the common iliac artery lesion was obtained. The copy was exchanged over a Bentson wire for a Omni Flush catheter and iliac angiography was performed. The Omni Flush was then exchanged for the copy catheter again over the Bentson wire. Pressure measurements were obtained utilizing a pull back technique from the aorta to the external iliac artery. Mean arterial pressure in the aorta is 108 mm Hg and mean pressure in the external iliac artery was 104 mm Hg for a gradient of 4 mm Hg. The 5 French sheath was removed and hemostasis was achieved with direct pressure. At least 30 minutes had transpired after heparin administration. Exo seal was not utilized secondary to close proximity of the sheath to a femoral artery branch. There was no evidence of groin hematoma or bleeding from the entry site during the time of compression. Compression was held for approximately 20 minutes. After the patient was transferred to the outpatient recovery area up stairs, she developed hypotension and was immediately rushed to the CT scan for imaging. This demonstrated a large retroperitoneal hemorrhage without active bleeding. She was resuscitated  in the CT scan suite by the critical care physician and myself. FINDINGS: Right lower extremity runoff demonstrates patency of the common femoral, profunda femoral, superficial femoral, and popliteal arteries. The posterior tibial artery opacifies to the ankle. There is very slow  flow in the peroneal and anterior tibial arteries which eventually do reach the ankle. Right iliac angiography demonstrates irregular plaque in the common femoral artery. As described above, a small pressure gradient across this area was determined. COMPLICATIONS: The patient suffered a retroperitoneal hemorrhage after she was transferred to the outpatient area. IMPRESSION: Evaluation of the right common iliac artery lesion demonstrates irregular plaque but only a small pressure gradient. Stenting was not performed at this time. Optimal treatment will remain as anti-platelet and lipid lowering medication therapy. The patient did suffer a retroperitoneal hemorrhage after she was transferred to the outpatient recovery area. She was resuscitated and transferred to the ICU. Electronically Signed   By: Jolaine Click M.D.   On: 09/29/2015 09:57   Ir US Guide Vasc Access Right  09/29/2015  CLINICAL DATA:  Right toe gangrene EXAM: RIGHT EXTREMITY ARTERIOGRAPHY; IR ULTRASOUND GUIDANCE VASC ACCESS RIGHT FLUOROSCOPY TIME:  7 minutes and 18 seconds MEDICATIONS AND MEDICAL HISTORY: Versed 1 mg, Fentanyl 50 mcg. Additional Medications: 5000 units of heparin. ANESTHESIA/SEDATION: Moderate sedation time: 40 minutes CONTRAST:  65 cc Visipaque 320 PROCEDURE: The procedure, risks, benefits, and alternatives were explained to the patient. Questions regarding the procedure were encouraged and answered. The patient understands and consents to the procedure. The right groin was prepped with Betadine in a sterile fashion, and a sterile drape was applied covering the operative field. A sterile gown and sterile gloves were used for the procedure. Under sonographic guidance, a micropuncture needle was inserted into the right common femoral artery and removed over a 018 wire. A 4 French transitional dilator was inserted for a right lower extremity runoff. Insertion into the artery was performed at the mid femoral head prosthetic component.  The transitional dilator was then exchanged for a 5 French sheath. A Kumpe be catheter was carefully advanced over a glidewire into the abdominal aorta. Initial attempts were made with a Bentson wire unsuccessfully. After some time, access across the common iliac artery lesion was obtained. The copy was exchanged over a Bentson wire for a Omni Flush catheter and iliac angiography was performed. The Omni Flush was then exchanged for the copy catheter again over the Bentson wire. Pressure measurements were obtained utilizing a pull back technique from the aorta to the external iliac artery. Mean arterial pressure in the aorta is 108 mm Hg and mean pressure in the external iliac artery was 104 mm Hg for a gradient of 4 mm Hg. The 5 French sheath was removed and hemostasis was achieved with direct pressure. At least 30 minutes had transpired after heparin administration. Exo seal was not utilized secondary to close proximity of the sheath to a femoral artery branch. There was no evidence of groin hematoma or bleeding from the entry site during the time of compression. Compression was held for approximately 20 minutes. After the patient was transferred to the outpatient recovery area up stairs, she developed hypotension and was immediately rushed to the CT scan for imaging. This demonstrated a large retroperitoneal hemorrhage without active bleeding. She was resuscitated in the CT scan suite by the critical care physician and myself. FINDINGS: Right lower extremity runoff demonstrates patency of the common femoral, profunda femoral, superficial femoral, and popliteal arteries. The posterior tibial artery opacifies  to the ankle. There is very slow flow in the peroneal and anterior tibial arteries which eventually do reach the ankle. Right iliac angiography demonstrates irregular plaque in the common femoral artery. As described above, a small pressure gradient across this area was determined. COMPLICATIONS: The patient  suffered a retroperitoneal hemorrhage after she was transferred to the outpatient area. IMPRESSION: Evaluation of the right common iliac artery lesion demonstrates irregular plaque but only a small pressure gradient. Stenting was not performed at this time. Optimal treatment will remain as anti-platelet and lipid lowering medication therapy. The patient did suffer a retroperitoneal hemorrhage after she was transferred to the outpatient recovery area. She was resuscitated and transferred to the ICU. Electronically Signed   By: Jolaine ClickArthur  Hoss M.D.   On: 09/29/2015 09:57   Dg Chest Port 1 View  10/01/2015  CLINICAL DATA:  Acute respiratory failure. EXAM: PORTABLE CHEST 1 VIEW COMPARISON:  09/30/2015, 09/29/2015 and 09/28/2015 FINDINGS: Endotracheal tube is 17 mm above the carina. Central catheter is in good position just below the carina. NG tube tip is in the body of the stomach. Venous sheath tip is in the left innominate vein. There is persistent atelectasis at the left lung base. Small bilateral effusions. Heart size and pulmonary vascularity are normal. IMPRESSION: Persistent atelectasis and small effusion at the left base. Increased haziness at the right base is most likely a small posteriorly layered pleural effusion. Electronically Signed   By: Francene BoyersJames  Maxwell M.D.   On: 10/01/2015 08:32   Dg Chest Port 1 View  09/30/2015  CLINICAL DATA:  Acute respiratory failure. EXAM: PORTABLE CHEST 1 VIEW COMPARISON:  10/26/2015. FINDINGS: Endotracheal tube tip again noted at the carina. Proximal repositioning suggested. Bilateral IJ lines, NG tube in stable position. Mediastinum hilar structures are normal. Heart size stable. Low lung volumes with bibasilar atelectasis. Mild infiltrate left lung base cannot be excluded. Small left pleural effusion cannot be excluded. No pneumothorax . IMPRESSION: 1. Endotracheal tube tip remains at the carina. Proximal repositioning of approximately 2 to 3 cm suggested. Remaining lines  and tubes in stable position. 2. Persistent bibasilar atelectasis. Mild infiltrate left lung base cannot be excluded. Critical Value/emergent results were called by telephone at the time of interpretation on 09/30/2015 at 7:22 am to nurse Abby, who verbally acknowledged these results. Electronically Signed   By: Maisie Fushomas  Register   On: 09/30/2015 07:23   Dg Chest Port 1 View  09/29/2015  CLINICAL DATA:  Central line placement. Acute respiratory failure. Cardiac arrest. EXAM: PORTABLE CHEST 1 VIEW COMPARISON:  Prior today FINDINGS: A new right jugular central venous catheter is seen with tip overlying the superior cavoatrial junction. No pneumothorax visualized. Endotracheal tube tip is seen at the level of the carina. Nasogastric tube is seen entering the stomach. Persistent left lower lobe atelectasis or consolidation is again seen. Mild bilateral perihilar atelectasis is also stable in appearance. IMPRESSION: New right jugular central venous catheter in appropriate position. No pneumothorax visualized. Low endotracheal tube position, with tip at level of carina. No significant change in left lower lobe atelectasis or consolidation and mild bilateral perihilar atelectasis. These results were discussed by telephone at the time of interpretation on 09/29/2015 at 1:05 pm with Dr. Rory PercyANIEL FEINSTEIN , who verbally acknowledged these results. Electronically Signed   By: Myles RosenthalJohn  Stahl M.D.   On: 09/29/2015 13:06   Dg Chest Port 1 View  09/29/2015  CLINICAL DATA:  Acute respiratory failure. Cardiac arrest. On ventilator. EXAM: PORTABLE CHEST 1 VIEW COMPARISON:  09/28/2015 FINDINGS: Endotracheal tube tip is now in the proximal right mainstem bronchus. Nasogastric tube seen within the stomach. Left jugular Cordis remains in place. Low lung volumes are again noted. Mild increase in left lower lobe atelectasis or consolidation noted. Small left pleural effusion cannot be excluded. Bilateral perihilar atelectasis also mildly  increased since prior study. No pneumothorax visualized. Heart size remains within normal limits. IMPRESSION: Low endotracheal tube position, with tip now overlying the proximal right mainstem bronchus. Mild increase in left lower lobe atelectasis or consolidation, and bilateral perihilar atelectasis. These results will be called to the ordering clinician or representative by the Radiologist Assistant, and communication documented in the PACS or zVision Dashboard. Electronically Signed   By: Myles Rosenthal M.D.   On: 09/29/2015 12:48   Dg Chest Port 1 View  09/28/2015  CLINICAL DATA:  Bedside central venous catheter placement. Ventilator dependent respiratory failure. Followup left pleural effusion. EXAM: PORTABLE CHEST 1 VIEW 1625 hr: COMPARISON:  Chest x-ray earlier same 1053 hr and previously. FINDINGS: Left jugular central venous catheter tip projects at or near the cavoatrial junction. No evidence of pneumothorax or mediastinal hematoma. Endotracheal tube tip remains in satisfactory position approximately 3 cm above the carina. Nasogastric tube tip in the stomach. Small to moderate-sized left pleural effusion and associated consolidation of the left lower lobe, unchanged since earlier in the day. Suboptimal inspiration which accounts for atelectasis at the right lung base. IMPRESSION: 1. Left jugular central venous catheter tip projects at or near the cavoatrial junction. No complicating features. 2. Stable small moderate size left pleural effusion and associated atelectasis versus pneumonia in the left lower lobe. 3. Suboptimal inspiration accounts for right basilar atelectasis. Electronically Signed   By: Hulan Saas M.D.   On: 09/28/2015 16:34   Dg Chest Port 1 View  09/28/2015  CLINICAL DATA:  Respiratory distress.  Pulmonary edema. EXAM: PORTABLE CHEST 1 VIEW COMPARISON:  2015/10/19 FINDINGS: Endotracheal tube is been inserted and is in good position. NG tube has been inserted and the tip is in the  stomach. Central venous sheath tip is in the proximal left innominate vein, unchanged. Heart size and pulmonary vascularity are normal. New small left pleural effusion and probable slight atelectasis at the left base. IMPRESSION: New small left effusion and atelectasis at the left lung base. Tubes in good position. Electronically Signed   By: Francene Boyers M.D.   On: 09/28/2015 11:21   Dg Chest Port 1 View  10/19/2015  CLINICAL DATA:  Code blue.  Endotracheal tube placement. EXAM: PORTABLE CHEST 1 VIEW COMPARISON:  02/09/2015 FINDINGS: Endotracheal tube placed with tip measuring 2.6 cm above the carina. There appears to be a central vascular sheath over the left jugular vein with tip projected over the upper mediastinum. This could be localized in the brachiocephalic vein or arterial structure. Normal heart size and pulmonary vascularity. Shallow inspiration. Slight atelectasis in the left lung base. No focal consolidation. No pneumothorax. No blunting of costophrenic angles. Acutea right and left lower rib fractures. IMPRESSION: Appliances positioned as described. Shallow inspiration. Acute rib fractures. No pneumothorax or focal consolidation in the lungs. Electronically Signed   By: Burman Nieves M.D.   On: October 19, 2015 20:18   Dg Chest Port 1 View  October 19, 2015  CLINICAL DATA:  Bedside central venous catheter placement. Current history of blue toe syndrome EXAM: PORTABLE CHEST 1 VIEW COMPARISON:  02/09/2015 and earlier. FINDINGS: Left jugular introducer sheath tip projects over the left innominate vein. No evidence of pneumothorax  or mediastinal hematoma. Suboptimal inspiration. Cardiac silhouette normal in size, unchanged. Prominent bronchovascular markings diffusely and central peribronchial thickening, unchanged, with likely bronchiectasis in the right lower lobe. Lungs otherwise clear. No pleural effusions. IMPRESSION: 1. Left jugular introducer sheath tip projects over the left innominate vein. 2.  Suboptimal inspiration.  No acute cardiopulmonary disease. Electronically Signed   By: Hulan Saas M.D.   On: 12-Oct-2015 16:09   Dg Abd Portable 1v  09/29/2015  CLINICAL DATA:  Abdominal bleed. EXAM: PORTABLE ABDOMEN - 1 VIEW COMPARISON:  CT 10/05/2015. FINDINGS: NG tube noted with tip projected over the stomach. No bowel distention. No free air. Degenerative changes scoliosis lumbar spine. Right hip replacement. Surgical staples over the right abdomen. Bibasilar subsegmental atelectasis. IMPRESSION: NG tube tip noted projected over the stomach. No bowel distention. No free air. Electronically Signed   By: Maisie Fus  Register   On: 09/29/2015 07:00   Ct Angio Abd/pel W/ And/or W/o  10/17/2015  CLINICAL DATA:  Hypotension after angiogram EXAM: CTA ABDOMEN AND PELVIS wITHOUT AND WITH CONTRAST TECHNIQUE: Multidetector CT imaging of the abdomen and pelvis was performed using the standard protocol during bolus administration of intravenous contrast. Multiplanar reconstructed images and MIPs were obtained and reviewed to evaluate the vascular anatomy. CONTRAST:  100 cc Omnipaque 350 COMPARISON:  None. FINDINGS: There is a large retroperitoneal hemorrhage extending from the right inguinal region into the right retroperitoneum towards the right upper quadrant. Contrast images demonstrate no pseudoaneurysm or active contrast extravasation. Plaque along the right common iliac artery is again noted. There is no dissection or focal thrombosis. Atherosclerotic changes of the celiac origin SMA origin, and bilateral renal arteries are stable. IMA is diminutive and patent. There is a small amount of hemoperitoneum about the liver. The bladder is deviated to the left secondary to the retroperitoneal hemorrhage. Right total hip arthroplasty is noted. Kidneys are within normal limits. Spleen pancreas and adrenal glands are unremarkable Several cysts throughout the liver are noted. Review of the MIP images confirms the above  findings. IMPRESSION: Large right retroperitoneal hemorrhage extending from the right inguinal region after angiogram. There is no active extravasation or bleeding. There is no pseudoaneurysm. Electronically Signed   By: Jolaine Click M.D.   On: 12-Oct-2015 17:53   Anti-infectives: Anti-infectives    Start     Dose/Rate Route Frequency Ordered Stop   10/06/2015 1200  ceFAZolin (ANCEF) IVPB 2 g/50 mL premix     2 g 100 mL/hr over 30 Minutes Intravenous  Once 10/02/2015 1153 Oct 12, 2015 1223      Assessment/Plan: s/p * No procedures listed * Critically ill. Continue support. Elevated bladder pressures noted but with low platelet count and no evidence of difficulty with respirations would not entertain exploration. Continue support.   LOS: 4 days   Ryle Buscemi 10/01/2015, 8:59 AM

## 2015-10-01 NOTE — Progress Notes (Signed)
PULMONARY / CRITICAL CARE MEDICINE   Name: [name] Herman MRN: [id number] DOB: 1935/01/19    ADMISSION DATE:  10-20-15 CONSULTATION DATE:  10/20/2015  REFERRING MD :  Dr. Bonnielee Haff, IR  CHIEF COMPLAINT:  RP bleeding after IR procedure  INITIAL PRESENTATION: 80 year old female who presented to [facility] October 20, 2015 for iliac artery stent placement. Procedure was done, no stent needed. Post-operatively she complained of nausea and had hypotension. Distended abdomen. CT with with retroperitoneal blood, not actively bleeding per Dr. Bonnielee Haff. To ICU for PCCM management.   STUDIES:  CT (A) abd/pelvis 10-20-2023) >>> Large right retroperitoneal hemorrhage extending from the right inguinal region after angiogram. There is no active extravasation or bleeding. There is no pseudoaneurysm. Lower extremity arterial duplex Oct 20, 2023) No evidence of psuedoaneurysm in the right groin. A hematoma is noted measuring 3.55cm. CXR (11/3): No significant change in left lower lobe atelectasis or consolidation and mild bilateral perihilar atelectasis CXR (11/4): Endotracheal tube tip remains at the carina. Proximal repositioning of approximately 2 to 3 cm suggested. Remaining lines and tubes in stable position. Persistent bibasilar atelectasis. Mild infiltrate left lung base cannot be excluded.  SIGNIFICANT EVENTS: 10/20/23 to IR for iliac stent, stent not needed, post op RP blood. Shock. To ICU October 20, 2023 coded pea 10 min , OR for evacuation hematoma and iliac repair  11/2- GI bleeding, renal called 11/3- less bleeding from OGT, no pressors 11/4- HD transitioned to [facility]  SUBJECTIVE:  Patient sedated. Doing well on weaning trial.  VITAL SIGNS: Temp:  [93.2 F (34 C)-99.1 F (37.3 C)] 97.4 F (36.3 C) (11/05 0829) Pulse Rate:  [46-134] 92 (11/05 0736) Resp:  [9-28] 15 (11/05 0736) BP: (100-135)/(46-90) 111/49 mmHg (11/05 0736) SpO2:  [64 %-100 %] 100 % (11/05 0736) Arterial Line BP: (94-150)/(43-72) 114/50  mmHg (11/05 0700) FiO2 (%):  [30 %] 30 % (11/05 0736) Weight:  [139 lb 1.8 oz (63.1 kg)] 139 lb 1.8 oz (63.1 kg) (11/05 0600) HEMODYNAMICS: CVP:  [10 mmHg-40 mmHg] 18 mmHg VENTILATOR SETTINGS: Vent Mode:  [-] PRVC FiO2 (%):  [30 %] 30 % Set Rate:  [12 bmp] 12 bmp Vt Set:  [400 mL] 400 mL PEEP:  [5 cmH20] 5 cmH20 Pressure Support:  [5 cmH20] 5 cmH20 Plateau Pressure:  [13 cmH20-19 cmH20] 13 cmH20 INTAKE / OUTPUT:  Intake/Output Summary (Last 24 hours) at 10/01/15 1024 Last data filed at 10/01/15 0700  Gross per 24 hour  Intake 1851.34 ml  Output   4605 ml  Net -2753.66 ml    PHYSICAL EXAMINATION: General:  Vented, Sedated Neuro:  Cranial nerves intact. No gross deficits. HEENT: No thryomegaly, JVD Cardiovascular:  S1, S2, RRR Lungs: Clear, intubated Abdomen:  Soft, + BS. Mild ooze from abd incision.  Musculoskeletal:  Upper ext edema rt dopplerable, left palpable Skin:  Necrotic appearing R toes.       LABS:  CBC  Recent Labs Lab 09/30/15 1535 10/01/15 0130 10/01/15 0400  WBC 17.2* 18.9* 18.1*  HGB 9.6* 10.3* 10.0*  HCT 28.5* 30.3* 29.8*  PLT 51* 59* 54*   Coag's  Recent Labs Lab Oct 20, 2015 1030 10/20/2015 1900 2015/10/20 2324  09/29/15 0938 09/30/15 0619 10/01/15 0400  APTT 21* 56* 118*  --   --   --   --   INR 0.96 2.07* 3.30*  < > 1.67* 1.52* 1.35  < > = values in this interval not displayed. BMET  Recent Labs Lab 09/30/15 0619 09/30/15 1535 10/01/15 0400  NA 139 140  136  K 3.4* 4.1 3.9  CL 105 102 102  CO2 BUN 22* 23* 19  CREATININE 2.98* 3.01* 1.87*  GLUCOSE 131* 124* 115*   Electrolytes  Recent Labs Lab 09/30/15 0619 09/30/15 1345 09/30/15 1535 10/01/15 0400  CALCIUM 6.6*  --  7.3* 7.4*  MG 1.8 1.7  --  2.2  PHOS 3.3  --  3.7  3.8 2.5   Sepsis Markers  Recent Labs Lab 09/28/15 0121 09/28/15 0410 10/01/15 0130  LATICACIDVEN 14.3* 15.2* 1.6   ABG  Recent Labs Lab 09/29/15 0710 09/30/15 0528 10/01/15 0325    PHART 7.514* 7.396 7.337*  PCO2ART 28.0* 40.6 44.1  PO2ART 79.5* 80.0 92.1   Liver Enzymes  Recent Labs Lab October 06, 2015 1950 09/30/15 0619 09/30/15 1535 10/01/15 0400  AST 123* 609*  --  351*  ALT 121* 232*  --  183*  ALKPHOS 24* 53  --  74  BILITOT 0.5 2.2*  --  5.1*  ALBUMIN 1.0* 2.3* 2.7* 2.9*   Cardiac Enzymes  Recent Labs Lab 09/28/15 0113 09/28/15 0330 09/28/15 0731  TROPONINI 0.41* 0.34* 0.61*   Glucose  Recent Labs Lab 09/30/15 1110 09/30/15 1534 09/30/15 1927 10/01/15 0005 10/01/15 0400 10/01/15 0831  GLUCAP 132* 107* 99 102* 105* 98    Imaging Dg Chest Port 1 View  10/01/2015  CLINICAL DATA:  Acute respiratory failure. EXAM: PORTABLE CHEST 1 VIEW COMPARISON:  09/30/2015, 09/29/2015 and 09/28/2015 FINDINGS: Endotracheal tube is 17 mm above the carina. Central catheter is in good position just below the carina. NG tube tip is in the body of the stomach. Venous sheath tip is in the left innominate vein. There is persistent atelectasis at the left lung base. Small bilateral effusions. Heart size and pulmonary vascularity are normal. IMPRESSION: Persistent atelectasis and small effusion at the left base. Increased haziness at the right base is most likely a small posteriorly layered pleural effusion. Electronically Signed   By: Francene Boyers M.D.   On: 10/01/2015 08:32     ASSESSMENT / PLAN:  PULMONARY A: COPD without acute exacerbation Acute resp failure, hypoxia High risk TRALI High risk uncompensated acidosis  pulm edema concerns  P:   Improved vent needs Continue SBT today cpap 5 ps 5. Getting closer to extubation.  Lasix per renal Fluid removal by [facility].  CARDIOVASCULAR CVL LIJ sheath 11/1 >>>  ALINE left fem 11/1>>> A:  Hemorrhagic shock resolved Blue toe syndrome PAD H/o HTN Trop leak, s/p cpr [facility], -2 L P:  Tolerating being off pressors.  Transfusion threshold hgb 7.0  RENAL A:   CKD Acute renal failure, ATN, post arrest ,  shock hypoK At risk abdo compartment, bladder pressure high but no elevated vent pressures or CVP No plan for repeat OR.  Hypernatremia Hypokalemia, 3.4 P:   BMET q12h [facility], - 100 cc/hr Replete electrolytes PRN  GASTROINTESTINAL A:   H/o PUD Active Gi bleed, likely all related to coags, fibrinogen, plat etc Improved bleeding Abdominal pressure 28 P:   Off tube feeds for high residuals. PPI drip Low suction NGT. Neg balance for badder pressure also important s/p massive resus  HEMATOLOGIC Total: PRBC 16 FFP 3 Cryo 11 A:   Acute retroperitoneal hemorrhage s/p IR arteriogram  Consumptive / dilutional coagulapthy DIC improved with cryo P:  CBC to q12h Follow CBC, platelets, coags  INFECTIOUS A:   Afebrile, No acute issues  P:   Off all abx No fevers  ENDOCRINE A:   Chronic  prednisone for polymyalgia rheumatica GI bleed P:   Stress dose steroids, continue Follow glucose on Bmet SSI D5W discontinued  NEUROLOGIC A:   Acute metabolic encephalopathy up and down R/o anoxia, post arrest P:   RASS goal: 0 Monitor closely Low dose fent, consider to int Avoid benzo  FAMILY  - Updates: family updated in full today. - Inter-disciplinary family meet or Palliative Care meeting due by:  11/7  Critical care time- 35 mins.  Chilton GreathousePraveen Ardean Simonich MD [facility] Pager [phone] If no answer or after 3pm call: [phone] 10/01/2015, 10:24 AM

## 2015-10-01 NOTE — Progress Notes (Signed)
Subjective: On vent, sedated. Per RN tolerating 5 hr vent wean last night, no further blood transfusion and continues to be off pressors.   Allergies: Meloxicam and Meperidine hcl  Medications: Prior to Admission medications   Medication Sig Start Date End Date Taking? Authorizing Provider  albuterol (PROVENTIL HFA;VENTOLIN HFA) 108 (90 BASE) MCG/ACT inhaler Inhale 2 puffs into the lungs every 4 (four) hours as needed for wheezing. 06/22/15 04/18/17 Yes Kristian Covey, MD  aspirin EC 81 MG tablet Take 81 mg by mouth daily.   Yes Historical Provider, MD  atorvastatin (LIPITOR) 10 MG tablet Take 1 tablet (10 mg total) by mouth daily. 08/25/15  Yes Kristian Covey, MD  Biotin 1000 MCG tablet Take 1,000 mcg by mouth daily.   Yes Historical Provider, MD  Calcium Carb-Cholecalciferol (CALCIUM 1000 + D PO) Take 1 tablet by mouth daily.    Yes Historical Provider, MD  clopidogrel (PLAVIX) 75 MG tablet Take 1 tablet (75 mg total) by mouth daily. 08/22/15  Yes Kristian Covey, MD  cyanocobalamin 500 MCG tablet Take 500 mcg by mouth daily.   Yes Historical Provider, MD  latanoprost (XALATAN) 0.005 % ophthalmic solution Place 1 drop into the right eye at bedtime.     Yes Historical Provider, MD  mometasone [facility]) 220 MCG/INH inhaler Inhale 2 puffs into the lungs daily. 01/05/15  Yes Kristian Covey, MD  Multiple Vitamins-Minerals (OCUVITE PO) Take by mouth.   Yes Historical Provider, MD  oxyCODONE-acetaminophen (PERCOCET) 10-325 MG per tablet Take 1 tablet by mouth every 6 (six) hours as needed for pain. 07/28/15 07/27/16 Yes Kristian Covey, MD  pantoprazole (PROTONIX) 40 MG tablet TAKE 1 TABLET (40 MG TOTAL) BY MOUTH DAILY. 07/14/15  Yes Kristian Covey, MD  predniSONE (DELTASONE) 10 MG tablet Take 10 mg by mouth daily with breakfast.   Yes Historical Provider, MD  sertraline (ZOLOFT) 100 MG tablet Take 0.5 tablets (50 mg total) by mouth daily. 07/14/15  Yes Kristian Covey, MD    triamterene-hydrochlorothiazide (DYAZIDE) 37.5-25 MG capsule TAKE ONE CAPSULE BY MOUTH EVERY MORNING 08/29/15  Yes Kristian Covey, MD    Vital Signs: BP 111/49 mmHg  Pulse 92  Temp(Src) 97.4 F (36.3 C) (Oral)  Resp 15  Ht 5' (1.524 m)  Wt 139 lb 1.8 oz (63.1 kg)  BMI 27.17 kg/m2  SpO2 100%  Physical Exam General: On vent, sedated, OG with dark bloody output, family in room Abd: Distended, soft, incision healing-some oozing  Ext: Dry gangrenous changes of 2-4 digits of foot   Imaging: Ir Angiogram Extremity Right  09/29/2015  CLINICAL DATA:  Right toe gangrene EXAM: RIGHT EXTREMITY ARTERIOGRAPHY; IR ULTRASOUND GUIDANCE VASC ACCESS RIGHT FLUOROSCOPY TIME:  7 minutes and 18 seconds MEDICATIONS AND MEDICAL HISTORY: Versed 1 mg, Fentanyl 50 mcg. Additional Medications: 5000 units of heparin. ANESTHESIA/SEDATION: Moderate sedation time: 40 minutes CONTRAST:  65 cc Visipaque 320 PROCEDURE: The procedure, risks, benefits, and alternatives were explained to the patient. Questions regarding the procedure were encouraged and answered. The patient understands and consents to the procedure. The right groin was prepped with Betadine in a sterile fashion, and a sterile drape was applied covering the operative field. A sterile gown and sterile gloves were used for the procedure. Under sonographic guidance, a micropuncture needle was inserted into the right common femoral artery and removed over a 018 wire. A 4 French transitional dilator was inserted for a right lower extremity runoff. Insertion into the artery was performed  at the mid femoral head prosthetic component. The transitional dilator was then exchanged for a 5 French sheath. A Kumpe be catheter was carefully advanced over a glidewire into the abdominal aorta. Initial attempts were made with a Bentson wire unsuccessfully. After some time, access across the common iliac artery lesion was obtained. The copy was exchanged over a Bentson wire for a  Omni Flush catheter and iliac angiography was performed. The Omni Flush was then exchanged for the copy catheter again over the Bentson wire. Pressure measurements were obtained utilizing a pull back technique from the aorta to the external iliac artery. Mean arterial pressure in the aorta is 108 mm Hg and mean pressure in the external iliac artery was 104 mm Hg for a gradient of 4 mm Hg. The 5 French sheath was removed and hemostasis was achieved with direct pressure. At least 30 minutes had transpired after heparin administration. Exo seal was not utilized secondary to close proximity of the sheath to a femoral artery branch. There was no evidence of groin hematoma or bleeding from the entry site during the time of compression. Compression was held for approximately 20 minutes. After the patient was transferred to the outpatient recovery area up stairs, she developed hypotension and was immediately rushed to the CT scan for imaging. This demonstrated a large retroperitoneal hemorrhage without active bleeding. She was resuscitated in the CT scan suite by the critical care physician and myself. FINDINGS: Right lower extremity runoff demonstrates patency of the common femoral, profunda femoral, superficial femoral, and popliteal arteries. The posterior tibial artery opacifies to the ankle. There is very slow flow in the peroneal and anterior tibial arteries which eventually do reach the ankle. Right iliac angiography demonstrates irregular plaque in the common femoral artery. As described above, a small pressure gradient across this area was determined. COMPLICATIONS: The patient suffered a retroperitoneal hemorrhage after she was transferred to the outpatient area. IMPRESSION: Evaluation of the right common iliac artery lesion demonstrates irregular plaque but only a small pressure gradient. Stenting was not performed at this time. Optimal treatment will remain as anti-platelet and lipid lowering medication therapy.  The patient did suffer a retroperitoneal hemorrhage after she was transferred to the outpatient recovery area. She was resuscitated and transferred to the ICU. Electronically Signed   By: Jolaine ClickArthur  Hoss M.D.   On: 09/29/2015 09:57   Ir [place] Guide Vasc Access Right  09/29/2015  CLINICAL DATA:  Right toe gangrene EXAM: RIGHT EXTREMITY ARTERIOGRAPHY; IR ULTRASOUND GUIDANCE VASC ACCESS RIGHT FLUOROSCOPY TIME:  7 minutes and 18 seconds MEDICATIONS AND MEDICAL HISTORY: Versed 1 mg, Fentanyl 50 mcg. Additional Medications: 5000 units of heparin. ANESTHESIA/SEDATION: Moderate sedation time: 40 minutes CONTRAST:  65 cc Visipaque 320 PROCEDURE: The procedure, risks, benefits, and alternatives were explained to the patient. Questions regarding the procedure were encouraged and answered. The patient understands and consents to the procedure. The right groin was prepped with Betadine in a sterile fashion, and a sterile drape was applied covering the operative field. A sterile gown and sterile gloves were used for the procedure. Under sonographic guidance, a micropuncture needle was inserted into the right common femoral artery and removed over a 018 wire. A 4 French transitional dilator was inserted for a right lower extremity runoff. Insertion into the artery was performed at the mid femoral head prosthetic component. The transitional dilator was then exchanged for a 5 French sheath. A Kumpe be catheter was carefully advanced over a glidewire into the abdominal aorta. Initial attempts were  made with a Bentson wire unsuccessfully. After some time, access across the common iliac artery lesion was obtained. The copy was exchanged over a Bentson wire for a Omni Flush catheter and iliac angiography was performed. The Omni Flush was then exchanged for the copy catheter again over the Bentson wire. Pressure measurements were obtained utilizing a pull back technique from the aorta to the external iliac artery. Mean arterial pressure in  the aorta is 108 mm Hg and mean pressure in the external iliac artery was 104 mm Hg for a gradient of 4 mm Hg. The 5 French sheath was removed and hemostasis was achieved with direct pressure. At least 30 minutes had transpired after heparin administration. Exo seal was not utilized secondary to close proximity of the sheath to a femoral artery branch. There was no evidence of groin hematoma or bleeding from the entry site during the time of compression. Compression was held for approximately 20 minutes. After the patient was transferred to the outpatient recovery area up stairs, she developed hypotension and was immediately rushed to the CT scan for imaging. This demonstrated a large retroperitoneal hemorrhage without active bleeding. She was resuscitated in the CT scan suite by the critical care physician and myself. FINDINGS: Right lower extremity runoff demonstrates patency of the common femoral, profunda femoral, superficial femoral, and popliteal arteries. The posterior tibial artery opacifies to the ankle. There is very slow flow in the peroneal and anterior tibial arteries which eventually do reach the ankle. Right iliac angiography demonstrates irregular plaque in the common femoral artery. As described above, a small pressure gradient across this area was determined. COMPLICATIONS: The patient suffered a retroperitoneal hemorrhage after she was transferred to the outpatient area. IMPRESSION: Evaluation of the right common iliac artery lesion demonstrates irregular plaque but only a small pressure gradient. Stenting was not performed at this time. Optimal treatment will remain as anti-platelet and lipid lowering medication therapy. The patient did suffer a retroperitoneal hemorrhage after she was transferred to the outpatient recovery area. She was resuscitated and transferred to the ICU. Electronically Signed   By: Jolaine Click M.D.   On: 09/29/2015 09:57   Dg Chest Port 1 View  10/01/2015  CLINICAL  DATA:  Acute respiratory failure. EXAM: PORTABLE CHEST 1 VIEW COMPARISON:  09/30/2015, 09/29/2015 and 09/28/2015 FINDINGS: Endotracheal tube is 17 mm above the carina. Central catheter is in good position just below the carina. NG tube tip is in the body of the stomach. Venous sheath tip is in the left innominate vein. There is persistent atelectasis at the left lung base. Small bilateral effusions. Heart size and pulmonary vascularity are normal. IMPRESSION: Persistent atelectasis and small effusion at the left base. Increased haziness at the right base is most likely a small posteriorly layered pleural effusion. Electronically Signed   By: Francene Boyers M.D.   On: 10/01/2015 08:32   Dg Chest Port 1 View  09/30/2015  CLINICAL DATA:  Acute respiratory failure. EXAM: PORTABLE CHEST 1 VIEW COMPARISON:  10/26/2015. FINDINGS: Endotracheal tube tip again noted at the carina. Proximal repositioning suggested. Bilateral IJ lines, NG tube in stable position. Mediastinum hilar structures are normal. Heart size stable. Low lung volumes with bibasilar atelectasis. Mild infiltrate left lung base cannot be excluded. Small left pleural effusion cannot be excluded. No pneumothorax . IMPRESSION: 1. Endotracheal tube tip remains at the carina. Proximal repositioning of approximately 2 to 3 cm suggested. Remaining lines and tubes in stable position. 2. Persistent bibasilar atelectasis. Mild infiltrate left lung  base cannot be excluded. Critical Value/emergent results were called by telephone at the time of interpretation on 09/30/2015 at 7:22 am to nurse Abby, who verbally acknowledged these results. Electronically Signed   By: Maisie Fus  Register   On: 09/30/2015 07:23   Dg Chest Port 1 View  09/29/2015  CLINICAL DATA:  Central line placement. Acute respiratory failure. Cardiac arrest. EXAM: PORTABLE CHEST 1 VIEW COMPARISON:  Prior today FINDINGS: A new right jugular central venous catheter is seen with tip overlying the superior  cavoatrial junction. No pneumothorax visualized. Endotracheal tube tip is seen at the level of the carina. Nasogastric tube is seen entering the stomach. Persistent left lower lobe atelectasis or consolidation is again seen. Mild bilateral perihilar atelectasis is also stable in appearance. IMPRESSION: New right jugular central venous catheter in appropriate position. No pneumothorax visualized. Low endotracheal tube position, with tip at level of carina. No significant change in left lower lobe atelectasis or consolidation and mild bilateral perihilar atelectasis. These results were discussed by telephone at the time of interpretation on 09/29/2015 at 1:05 pm with Dr. Rory Percy , who verbally acknowledged these results. Electronically Signed   By: Myles Rosenthal M.D.   On: 09/29/2015 13:06   Dg Chest Port 1 View  09/29/2015  CLINICAL DATA:  Acute respiratory failure. Cardiac arrest. On ventilator. EXAM: PORTABLE CHEST 1 VIEW COMPARISON:  09/28/2015 FINDINGS: Endotracheal tube tip is now in the proximal right mainstem bronchus. Nasogastric tube seen within the stomach. Left jugular Cordis remains in place. Low lung volumes are again noted. Mild increase in left lower lobe atelectasis or consolidation noted. Small left pleural effusion cannot be excluded. Bilateral perihilar atelectasis also mildly increased since prior study. No pneumothorax visualized. Heart size remains within normal limits. IMPRESSION: Low endotracheal tube position, with tip now overlying the proximal right mainstem bronchus. Mild increase in left lower lobe atelectasis or consolidation, and bilateral perihilar atelectasis. These results will be called to the ordering clinician or representative by the Radiologist Assistant, and communication documented in the PACS or zVision Dashboard. Electronically Signed   By: Myles Rosenthal M.D.   On: 09/29/2015 12:48   Dg Chest Port 1 View  09/28/2015  CLINICAL DATA:  Bedside central venous catheter  placement. Ventilator dependent respiratory failure. Followup left pleural effusion. EXAM: PORTABLE CHEST 1 VIEW 1625 hr: COMPARISON:  Chest x-ray earlier same 1053 hr and previously. FINDINGS: Left jugular central venous catheter tip projects at or near the cavoatrial junction. No evidence of pneumothorax or mediastinal hematoma. Endotracheal tube tip remains in satisfactory position approximately 3 cm above the carina. Nasogastric tube tip in the stomach. Small to moderate-sized left pleural effusion and associated consolidation of the left lower lobe, unchanged since earlier in the day. Suboptimal inspiration which accounts for atelectasis at the right lung base. IMPRESSION: 1. Left jugular central venous catheter tip projects at or near the cavoatrial junction. No complicating features. 2. Stable small moderate size left pleural effusion and associated atelectasis versus pneumonia in the left lower lobe. 3. Suboptimal inspiration accounts for right basilar atelectasis. Electronically Signed   By: Hulan Saas M.D.   On: 09/28/2015 16:34   Dg Chest Port 1 View  09/28/2015  CLINICAL DATA:  Respiratory distress.  Pulmonary edema. EXAM: PORTABLE CHEST 1 VIEW COMPARISON:  10/17/2015 FINDINGS: Endotracheal tube is been inserted and is in good position. NG tube has been inserted and the tip is in the stomach. Central venous sheath tip is in the proximal left innominate vein, unchanged.  Heart size and pulmonary vascularity are normal. New small left pleural effusion and probable slight atelectasis at the left base. IMPRESSION: New small left effusion and atelectasis at the left lung base. Tubes in good position. Electronically Signed   By: Francene Boyers M.D.   On: 09/28/2015 11:21   Dg Chest Port 1 View  09/30/15  CLINICAL DATA:  Code blue.  Endotracheal tube placement. EXAM: PORTABLE CHEST 1 VIEW COMPARISON:  02/09/2015 FINDINGS: Endotracheal tube placed with tip measuring 2.6 cm above the carina. There  appears to be a central vascular sheath over the left jugular vein with tip projected over the upper mediastinum. This could be localized in the brachiocephalic vein or arterial structure. Normal heart size and pulmonary vascularity. Shallow inspiration. Slight atelectasis in the left lung base. No focal consolidation. No pneumothorax. No blunting of costophrenic angles. Acutea right and left lower rib fractures. IMPRESSION: Appliances positioned as described. Shallow inspiration. Acute rib fractures. No pneumothorax or focal consolidation in the lungs. Electronically Signed   By: Burman Nieves M.D.   On: 30-Sep-2015 20:18   Dg Chest Port 1 View  Sep 30, 2015  CLINICAL DATA:  Bedside central venous catheter placement. Current history of blue toe syndrome EXAM: PORTABLE CHEST 1 VIEW COMPARISON:  02/09/2015 and earlier. FINDINGS: Left jugular introducer sheath tip projects over the left innominate vein. No evidence of pneumothorax or mediastinal hematoma. Suboptimal inspiration. Cardiac silhouette normal in size, unchanged. Prominent bronchovascular markings diffusely and central peribronchial thickening, unchanged, with likely bronchiectasis in the right lower lobe. Lungs otherwise clear. No pleural effusions. IMPRESSION: 1. Left jugular introducer sheath tip projects over the left innominate vein. 2. Suboptimal inspiration.  No acute cardiopulmonary disease. Electronically Signed   By: Hulan Saas M.D.   On: September 30, 2015 16:09   Dg Abd Portable 1v  09/29/2015  CLINICAL DATA:  Abdominal bleed. EXAM: PORTABLE ABDOMEN - 1 VIEW COMPARISON:  CT 09-30-15. FINDINGS: NG tube noted with tip projected over the stomach. No bowel distention. No free air. Degenerative changes scoliosis lumbar spine. Right hip replacement. Surgical staples over the right abdomen. Bibasilar subsegmental atelectasis. IMPRESSION: NG tube tip noted projected over the stomach. No bowel distention. No free air. Electronically Signed   By:  Maisie Fus  Register   On: 09/29/2015 07:00   Ct Angio Abd/pel W/ And/or W/o  09/30/2015  CLINICAL DATA:  Hypotension after angiogram EXAM: CTA ABDOMEN AND PELVIS wITHOUT AND WITH CONTRAST TECHNIQUE: Multidetector CT imaging of the abdomen and pelvis was performed using the standard protocol during bolus administration of intravenous contrast. Multiplanar reconstructed images and MIPs were obtained and reviewed to evaluate the vascular anatomy. CONTRAST:  100 cc Omnipaque 350 COMPARISON:  None. FINDINGS: There is a large retroperitoneal hemorrhage extending from the right inguinal region into the right retroperitoneum towards the right upper quadrant. Contrast images demonstrate no pseudoaneurysm or active contrast extravasation. Plaque along the right common iliac artery is again noted. There is no dissection or focal thrombosis. Atherosclerotic changes of the celiac origin SMA origin, and bilateral renal arteries are stable. IMA is diminutive and patent. There is a small amount of hemoperitoneum about the liver. The bladder is deviated to the left secondary to the retroperitoneal hemorrhage. Right total hip arthroplasty is noted. Kidneys are within normal limits. Spleen pancreas and adrenal glands are unremarkable Several cysts throughout the liver are noted. Review of the MIP images confirms the above findings. IMPRESSION: Large right retroperitoneal hemorrhage extending from the right inguinal region after angiogram. There is no active  extravasation or bleeding. There is no pseudoaneurysm. Electronically Signed   By: Jolaine Click M.D.   On: 10-08-15 17:53    Labs:  CBC:  Recent Labs  09/30/15 0619 09/30/15 1535 10/01/15 0130 10/01/15 0400  WBC 13.3* 17.2* 18.9* 18.1*  HGB 8.7* 9.6* 10.3* 10.0*  HCT 24.8* 28.5* 30.3* 29.8*  PLT 43* 51* 59* 54*    COAGS:  Recent Labs  10/08/2015 1030 Oct 08, 2015 1900 2015-10-08 2324  09/28/15 1805 09/29/15 0938 09/30/15 0619 10/01/15 0400  INR 0.96 2.07*  3.30*  < > 1.66* 1.67* 1.52* 1.35  APTT 21* 56* 118*  --   --   --   --   --   < > = values in this interval not displayed.  BMP:  Recent Labs  09/29/15 1736 09/30/15 0619 09/30/15 1535 10/01/15 0400  NA 140 139 140 136  K 3.4* 3.4* 4.1 3.9  CL 100* 105 102 102  CO2 22 23 25 25   GLUCOSE 146* 131* 124* 115*  BUN 14 22* 23* 19  CALCIUM 7.7* 6.6* 7.3* 7.4*  CREATININE 2.10* 2.98* 3.01* 1.87*  GFRNONAA 21* 14* 14* 25*  GFRAA 25* 16* 16* 29*    LIVER FUNCTION TESTS:  Recent Labs  10/08/2015 1640 10-08-2015 1950 09/30/15 0619 09/30/15 1535 10/01/15 0400  BILITOT 0.7 0.5 2.2*  --  5.1*  AST 27 123* 609*  --  351*  ALT 13* 121* 232*  --  183*  ALKPHOS 41 24* 53  --  74  PROT 4.6* <3.0* 4.6*  --  5.8*  ALBUMIN 2.6* 1.0* 2.3* 2.7* 2.9*    Assessment and Plan: Right iliac artery stenosis s/p diagnostic arteriogram 2015/10/08 in IR Post procedure nausea and hypotension, CT with Bleed  Hemorrhagic shock, now off pressors, H/H stable S/p evacuation of retroperitoneal hematoma and repair of external iliac artery by Vascular- Appreciate vascular assistance ATN secondary to hemorrhagic shock, PEA and contrast nephropathy on CRRT, Cr trending down 1.87 (3.01) after dialysis, still low urine output  VDRF on Vent, sedated- plans per CCM- appreciate assistance  Will report to Dr. Bonnielee Haff today   Signed: Pattricia Boss D 10/01/2015, 9:32 AM

## 2015-10-01 NOTE — Progress Notes (Signed)
eLink Physician-Brief Progress Note Patient Name: [name] Herman DOB: 09/14/1935 MRN: [id number]   Date of Service  10/01/2015  HPI/Events of Note  RN says bladder pressure measurements sare high but belly mno more distended. No high PiP on vent 0per RN and confirmed on camera  No frank bleeding except some blood via foleu y during bladdreer pressure measurment  eICU Interventions  Check cbc, lactate, ck     Intervention Category Intermediate Interventions: Other:  Canary Fister 10/01/2015, 1:26 AM

## 2015-10-01 NOTE — Procedures (Signed)
Admit: 09/29/2015 LOS: 4  89F w/ anuric ATN 2/2 hemorrhagic shock, PEA arrest, and contrast nephropathy  Current CRRT Prescription: Start Date: 09/30/15 Catheter: R IJ temp cath placed 11/4 BFR: 200 Pre Blood Pump: 1000 4K DFR: 1000 4K Replacement Rate: 300 4K Goal UF: net neg 112mL/hr Anticoagulation: none Clotting: none since starting  S: Much of UOP could be fluid used for transducing bladder pressure Had been declining, but ?40 overnight Tolerating CRRT No pressors  O: 11/04 0701 - 11/05 0700 In: 1936.8 [I.V.:840.9; NG/GT:715.8] Out: 4394 [Urine:298; Emesis/NG output:700; Stool:60]  Filed Weights   09/29/15 0200 09/30/15 0425 10/01/15 0600  Weight: 66.6 kg (146 lb 13.2 oz) 64.3 kg (141 lb 12.1 oz) 63.1 kg (139 lb 1.8 oz)     Recent Labs Lab 09/30/15 0619 09/30/15 1535 10/01/15 0400  NA 139 140 136  K 3.4* 4.1 3.9  CL 105 102 102  CO2 GLUCOSE 131* 124* 115*  BUN 22* 23* 19  CREATININE 2.98* 3.01* 1.87*  CALCIUM 6.6* 7.3* 7.4*  PHOS 3.3 3.7  3.8 2.5    Recent Labs Lab 10/20/2015 1640 10/07/2015 1900  09/30/15 1535 10/01/15 0130 10/01/15 0400  WBC 27.1* 13.8*  < > 17.2* 18.9* 18.1*  NEUTROABS 21.9* 10.6*  --   --   --   --   HGB 12.7 5.6*  < > 9.6* 10.3* 10.0*  HCT 37.2 17.0*  < > 28.5* 30.3* 29.8*  MCV 88.6 89.5  < > 86.4 87.1 87.6  PLT 216 115*  < > 51* 59* 54*  < > = values in this interval not displayed.  Scheduled Meds: . sodium chloride   Intravenous Once  . antiseptic oral rinse  7 mL Mouth Rinse QID  . budesonide  0.25 mg Nebulization Q12H  . chlorhexidine gluconate  15 mL Mouth Rinse BID  . fentaNYL (SUBLIMAZE) injection  50 mcg Intravenous Once  . hydrocortisone sod succinate (SOLU-CORTEF) inj  50 mg Intravenous Q6H  . insulin aspart  0-9 Units Subcutaneous 6 times per day  . ipratropium-albuterol  3 mL Nebulization Q4H  . pantoprazole (PROTONIX) IV  80 mg Intravenous Once  . pantoprazole (PROTONIX) IV  40 mg Intravenous Q12H    Continuous Infusions: . feeding supplement (VITAL AF 1.2 CAL) Stopped (10/01/15 0600)  . fentaNYL infusion INTRAVENOUS 150 mcg/hr (10/01/15 0453)  . furosemide (LASIX) infusion Stopped (09/29/15 1200)  . norepinephrine (LEVOPHED) Adult infusion Stopped (09/29/15 1825)  . pantoprozole (PROTONIX) infusion 8 mg/hr (10/01/15 0507)  . dialysis replacement fluid (prismasate) 1,000 mL/hr at 10/01/15 0449  . dialysis replacement fluid (prismasate) 300 mL/hr at 10/01/15 6045  . dialysate (PRISMASATE) 1,000 mL/hr at 10/01/15 0449   PRN Meds:.fentaNYL, fentaNYL (SUBLIMAZE) injection, heparin, iodixanol, ondansetron (ZOFRAN) IV, sodium chloride  ABG    Component Value Date/Time   PHART 7.337* 10/01/2015 0325   PCO2ART 44.1 10/01/2015 0325   PO2ART 92.1 10/01/2015 0325   HCO3 23.0 10/01/2015 0325   TCO2 24.4 10/01/2015 0325   ACIDBASEDEF 1.9 10/01/2015 0325   O2SAT 96.7 10/01/2015 0325    A/P  1. Anuric AKI 2/2 ATN ? Abdominal compartment syndrome 1. Baseline SCr ~1.1 2. Foley catheter in place; CT A/P 11/1 w unremarkable kidney appearance 3. On CRRT for ultrafiltration and to improve fluid balance; family updated; currently no heparin 4. Tolerating current settigs, achieving net negative UF 1. Hemorrhagic Shock: off pressors, trending down, per CCM 2. VDRF per CCM 3. RP Hematoma s/p evaculation and arterial repair  4. PAD 5. HTN off meds, not on ACEi as outpt  Sabra Heckyan Lofton Leon, MD [facility] pgr [phone]

## 2015-10-02 LAB — RENAL FUNCTION PANEL
ALBUMIN: 2.7 g/dL — AB (ref 3.5–5.0)
Anion gap: 12 (ref 5–15)
BUN: 18 mg/dL (ref 6–20)
CO2: 24 mmol/L (ref 22–32)
Calcium: 8 mg/dL — ABNORMAL LOW (ref 8.9–10.3)
Chloride: 102 mmol/L (ref 101–111)
Creatinine, Ser: 1.07 mg/dL — ABNORMAL HIGH (ref 0.44–1.00)
GFR calc Af Amer: 56 mL/min — ABNORMAL LOW (ref >60)
GFR calc non Af Amer: 48 mL/min — ABNORMAL LOW (ref >60)
GLUCOSE: 88 mg/dL (ref 65–99)
PHOSPHORUS: 1.9 mg/dL — AB (ref 2.5–4.6)
POTASSIUM: 4.6 mmol/L (ref 3.5–5.1)
Sodium: 138 mmol/L (ref 135–145)

## 2015-10-02 LAB — MAGNESIUM
MAGNESIUM: 2.5 mg/dL — AB (ref 1.7–2.4)
Magnesium: 2.4 mg/dL (ref 1.7–2.4)

## 2015-10-02 LAB — COMPREHENSIVE METABOLIC PANEL
ALBUMIN: 2.5 g/dL — AB (ref 3.5–5.0)
ALK PHOS: 81 U/L (ref 38–126)
ALT: 126 U/L — AB (ref 14–54)
AST: 180 U/L — ABNORMAL HIGH (ref 15–41)
Anion gap: 9 (ref 5–15)
BILIRUBIN TOTAL: 8.7 mg/dL — AB (ref 0.3–1.2)
BUN: 16 mg/dL (ref 6–20)
CO2: 26 mmol/L (ref 22–32)
Calcium: 7.6 mg/dL — ABNORMAL LOW (ref 8.9–10.3)
Chloride: 102 mmol/L (ref 101–111)
Creatinine, Ser: 1.34 mg/dL — ABNORMAL HIGH (ref 0.44–1.00)
GFR calc Af Amer: 43 mL/min — ABNORMAL LOW (ref >60)
GFR calc non Af Amer: 37 mL/min — ABNORMAL LOW (ref >60)
GLUCOSE: 95 mg/dL (ref 65–99)
Potassium: 4.5 mmol/L (ref 3.5–5.1)
SODIUM: 137 mmol/L (ref 135–145)
TOTAL PROTEIN: 5.6 g/dL — AB (ref 6.5–8.1)

## 2015-10-02 LAB — GLUCOSE, CAPILLARY
GLUCOSE-CAPILLARY: 59 mg/dL — AB (ref 65–99)
Glucose-Capillary: 88 mg/dL (ref 65–99)
Glucose-Capillary: 77 mg/dL (ref 65–99)
Glucose-Capillary: 77 mg/dL (ref 65–99)
Glucose-Capillary: 80 mg/dL (ref 65–99)
Glucose-Capillary: 129 mg/dL — ABNORMAL HIGH (ref 65–99)
Glucose-Capillary: 69 mg/dL (ref 65–99)

## 2015-10-02 LAB — CBC
HEMATOCRIT: 28 % — AB (ref 36.0–46.0)
HEMOGLOBIN: 9.3 g/dL — AB (ref 12.0–15.0)
MCH: 29.3 pg (ref 26.0–34.0)
MCHC: 33.2 g/dL (ref 30.0–36.0)
MCV: 88.3 fL (ref 78.0–100.0)
Platelets: 52 10*3/uL — ABNORMAL LOW (ref 150–400)
RBC: 3.17 MIL/uL — ABNORMAL LOW (ref 3.87–5.11)
RDW: 18.1 % — AB (ref 11.5–15.5)
WBC: 15.6 10*3/uL — AB (ref 4.0–10.5)

## 2015-10-02 LAB — PHOSPHORUS
Phosphorus: 2.1 mg/dL — ABNORMAL LOW (ref 2.5–4.6)
Phosphorus: 1.9 mg/dL — ABNORMAL LOW (ref 2.5–4.6)

## 2015-10-02 MED ORDER — DEXTROSE 50 % IV SOLN
25.0000 mL | Freq: Once | INTRAVENOUS | Status: AC
  Administered 2015-10-02: 25 mL via INTRAVENOUS

## 2015-10-02 MED ORDER — DEXTROSE 50 % IV SOLN
INTRAVENOUS | Status: AC
  Administered 2015-10-02: 25 mL via INTRAVENOUS
  Filled 2015-10-02: qty 50

## 2015-10-02 MED ORDER — HYDROCORTISONE NA SUCCINATE PF 100 MG IJ SOLR
50.0000 mg | Freq: Two times a day (BID) | INTRAMUSCULAR | Status: DC
  Administered 2015-10-02 – 2015-10-07 (×10): 50 mg via INTRAVENOUS
  Filled 2015-10-02 (×10): qty 1

## 2015-10-02 MED ORDER — PANTOPRAZOLE SODIUM 40 MG IV SOLR: 40.0000 mg | INTRAVENOUS | Status: DC

## 2015-10-02 NOTE — Procedures (Signed)
Admit: 10/03/2015 LOS: 5  23F w/ anuric ATN 2/2 hemorrhagic shock, PEA arrest, and contrast nephropathy  Current CRRT Prescription: Start Date: 09/30/15 Catheter: R IJ temp cath placed 11/4 BFR: 300 Pre Blood Pump: 1000 4K DFR: 1000 4K Replacement Rate: 300 4K Goal UF: net neg 16750mL/hr Anticoagulation: none Clotting: infrequent  S: No new events Follows Commands some this AM for RN Tolerating aggressive UF Infrequent clotting Bladder pressure 13 this AM  O: 11/05 0701 - 11/06 0700 In: 738 [I.V.:478; NG/GT:40] Out: 4524 [Urine:140; Emesis/NG output:350]  Filed Weights   09/30/15 0425 10/01/15 0600 10/02/15 0300  Weight: 64.3 kg (141 lb 12.1 oz) 63.1 kg (139 lb 1.8 oz) 60 kg (132 lb 4.4 oz)     Recent Labs Lab 10/01/15 0400 10/01/15 1540 10/02/15 0429  NA 136 137 137  K 3.9 4.1 4.5  CL 102 104 102  CO2 25 25 26   GLUCOSE 115* 100* 95  BUN 19 17 16   CREATININE 1.87* 1.50* 1.34*  CALCIUM 7.4* 7.4* 7.6*  PHOS 2.5 2.4* 2.1*    Recent Labs Lab 06-Feb-2015 1640 06-Feb-2015 1900  10/01/15 0400 10/01/15 1542 10/02/15 0429  WBC 27.1* 13.8*  < > 18.1* 17.0* 15.6*  NEUTROABS 21.9* 10.6*  --   --   --   --   HGB 12.7 5.6*  < > 10.0* 9.4* 9.3*  HCT 37.2 17.0*  < > 29.8* 28.3* 28.0*  MCV 88.6 89.5  < > 87.6 87.9 88.3  PLT 216 115*  < > 54* 54* 52*  < > = values in this interval not displayed.  Scheduled Meds: . antiseptic oral rinse  7 mL Mouth Rinse QID  . budesonide  0.25 mg Nebulization Q12H  . chlorhexidine gluconate  15 mL Mouth Rinse BID  . fentaNYL (SUBLIMAZE) injection  50 mcg Intravenous Once  . hydrocortisone sod succinate (SOLU-CORTEF) inj  50 mg Intravenous Q6H  . insulin aspart  0-9 Units Subcutaneous 6 times per day  . ipratropium-albuterol  3 mL Nebulization Q4H  . pantoprazole (PROTONIX) IV  40 mg Intravenous Q12H   Continuous Infusions: . sodium chloride 10 mL/hr at 10/01/15 2000  . feeding supplement (VITAL AF 1.2 CAL) Stopped (10/01/15 0600)  .  fentaNYL infusion INTRAVENOUS 50 mcg/hr (10/01/15 2000)  . furosemide (LASIX) infusion Stopped (09/29/15 1200)  . norepinephrine (LEVOPHED) Adult infusion Stopped (09/29/15 1825)  . dialysis replacement fluid (prismasate) 1,000 mL/hr at 10/02/15 0548  . dialysis replacement fluid (prismasate) 300 mL/hr at 10/01/15 2200  . dialysate (PRISMASATE) 1,000 mL/hr at 10/02/15 0554   PRN Meds:.fentaNYL, fentaNYL (SUBLIMAZE) injection, heparin, iodixanol, ondansetron (ZOFRAN) IV, sodium chloride  ABG    Component Value Date/Time   PHART 7.337* 10/01/2015 0325   PCO2ART 44.1 10/01/2015 0325   PO2ART 92.1 10/01/2015 0325   HCO3 23.0 10/01/2015 0325   TCO2 24.4 10/01/2015 0325   ACIDBASEDEF 1.9 10/01/2015 0325   O2SAT 96.7 10/01/2015 0325    A/P  1. Anuric AKI 2/2 ATN related to CIN, shock, PEA arrest, ? Abdominal compartment syndrome 1. Baseline SCr ~1.1 2. Foley catheter in place; CT A/P 11/1 w unremarkable kidney appearance 3. On CRRT for ultrafiltration and to improve fluid balance; currently no heparin 4. Tolerating current settigs, achieving net negative UF 5. Cont again today to improve fluid removal 1. Hemorrhagic Shock: off pressors, per CCM; stable 2. VDRF per CCM, ?SBT today? 3. RP Hematoma s/p evaculation and arterial repair 4. PAD 5. HTN off meds, not on ACEi as outpt  Pearson Grippe, MD [facility] pgr [phone]

## 2015-10-02 NOTE — Care Management Important Message (Signed)
Important Message  Patient Details  Name: Orma FlamingMartha H Snipe MRN: [id number] Date of Birth: 01/05/1935   Medicare Important Message Given:  Yes-second notification given    Kyla BalzarineShealy, Cosmo Tetreault Abena 10/02/2015, 10:01 AM

## 2015-10-02 NOTE — Progress Notes (Signed)
ET tube advanced 2 cm and secured 23 cm at lip.  Patient tolerated well.

## 2015-10-02 NOTE — Progress Notes (Addendum)
PULMONARY / CRITICAL CARE MEDICINE   Name: FAREN FLORENCE MRN: [id number] DOB: 1935-10-12    ADMISSION DATE:  2015-10-15 CONSULTATION DATE:  10/15/15  REFERRING MD :  Dr. Bonnielee Haff, IR  CHIEF COMPLAINT:  RP bleeding after IR procedure  INITIAL PRESENTATION: 80 year old female who presented to [facility] 2015-10-15 for iliac artery stent placement. Procedure was done, no stent needed. Post-operatively she complained of nausea and had hypotension. Distended abdomen. CT with with retroperitoneal blood, not actively bleeding per Dr. Bonnielee Haff. To ICU for PCCM management.   STUDIES:  CT (A) abd/pelvis Oct 15, 2023) >>> Large right retroperitoneal hemorrhage extending from the right inguinal region after angiogram. There is no active extravasation or bleeding. There is no pseudoaneurysm. Lower extremity arterial duplex 2023/10/15) No evidence of psuedoaneurysm in the right groin. A hematoma is noted measuring 3.55cm. CXR (11/3): No significant change in left lower lobe atelectasis or consolidation and mild bilateral perihilar atelectasis CXR (11/4): Endotracheal tube tip remains at the carina. Proximal repositioning of approximately 2 to 3 cm suggested. Remaining lines and tubes in stable position. Persistent bibasilar atelectasis. Mild infiltrate left lung base cannot be excluded.  SIGNIFICANT EVENTS: 15-Oct-2023 to IR for iliac stent, stent not needed, post op RP blood. Shock. To ICU 10/15/2023 coded pea 10 min , OR for evacuation hematoma and iliac repair  11/2- GI bleeding, renal called 11/3- less bleeding from OGT, no pressors 11/4- HD transitioned to CVVH  SUBJECTIVE:  Patient sedated. Doing well on weaning trial.  VITAL SIGNS: Temp:  [97.5 F (36.4 C)-98.3 F (36.8 C)] 98.1 F (36.7 C) (11/06 0806) Pulse Rate:  [78-100] 91 (11/06 0900) Resp:  [7-24] 15 (11/06 0900) BP: (102-161)/(43-71) 143/52 mmHg (11/06 0900) SpO2:  [95 %-100 %] 100 % (11/06 0900) Arterial Line BP: (101-157)/(41-67) 138/55  mmHg (11/06 0900) FiO2 (%):  [30 %] 30 % (11/06 0900) Weight:  [132 lb 4.4 oz (60 kg)] 132 lb 4.4 oz (60 kg) (11/06 0300) HEMODYNAMICS: CVP:  [5 mmHg] 5 mmHg VENTILATOR SETTINGS: Vent Mode:  [-] PRVC FiO2 (%):  [30 %] 30 % Set Rate:  [12 bmp] 12 bmp Vt Set:  [400 mL] 400 mL PEEP:  [5 cmH20] 5 cmH20 Plateau Pressure:  [10 cmH20-16 cmH20] 11 cmH20 INTAKE / OUTPUT:  Intake/Output Summary (Last 24 hours) at 10/02/15 0955 Last data filed at 10/02/15 0700  Gross per 24 hour  Intake    658 ml  Output   4316 ml  Net  -3658 ml    PHYSICAL EXAMINATION: General:  Vented, Sedated Neuro:  Cranial nerves intact. No gross deficits. HEENT: No thryomegaly, JVD Cardiovascular:  S1, S2, RRR Lungs: Clear, intubated Abdomen:  Soft, + BS. Mild ooze from abd incision.  Musculoskeletal:  Upper ext edema rt dopplerable, left palpable Skin:  Necrotic appearing R toes.   Photos (09/29/05)     LABS:  CBC  Recent Labs Lab 10/01/15 0400 10/01/15 1542 10/02/15 0429  WBC 18.1* 17.0* 15.6*  HGB 10.0* 9.4* 9.3*  HCT 29.8* 28.3* 28.0*  PLT 54* 54* 52*   Coag's  Recent Labs Lab October 15, 2015 1030 2015/10/15 1900 10/15/15 2324  09/29/15 0938 09/30/15 0619 10/01/15 0400  APTT 21* 56* 118*  --   --   --   --   INR 0.96 2.07* 3.30*  < > 1.67* 1.52* 1.35  < > = values in this interval not displayed. BMET  Recent Labs Lab 10/01/15 0400 10/01/15 1540 10/02/15 0429  NA 136 137 137  K 3.9 4.1 4.5  CL 102 104 102  CO2 25 25 26   BUN 19 17 16   CREATININE 1.87* 1.50* 1.34*  GLUCOSE 115* 100* 95   Electrolytes  Recent Labs Lab 10/01/15 0400 10/01/15 1540 10/02/15 0429  CALCIUM 7.4* 7.4* 7.6*  MG 2.2 2.4 2.4  PHOS 2.5 2.4* 2.1*   Sepsis Markers  Recent Labs Lab 09/28/15 0121 09/28/15 0410 10/01/15 0130  LATICACIDVEN 14.3* 15.2* 1.6   ABG  Recent Labs Lab 09/29/15 0710 09/30/15 0528 10/01/15 0325  PHART 7.514* 7.396 7.337*  PCO2ART 28.0* 40.6 44.1  PO2ART 79.5* 80.0  92.1   Liver Enzymes  Recent Labs Lab 09/30/15 0619  10/01/15 0400 10/01/15 1540 10/02/15 0429  AST 609*  --  351*  --  180*  ALT 232*  --  183*  --  126*  ALKPHOS 53  --  74  --  81  BILITOT 2.2*  --  5.1*  --  8.7*  ALBUMIN 2.3*  < > 2.9* 2.7* 2.5*  < > = values in this interval not displayed. Cardiac Enzymes  Recent Labs Lab 09/28/15 0113 09/28/15 0330 09/28/15 0731  TROPONINI 0.41* 0.34* 0.61*   Glucose  Recent Labs Lab 10/01/15 1220 10/01/15 1612 10/01/15 1933 10/01/15 2339 10/02/15 0337 10/02/15 0800  GLUCAP 94 80 78 77 88 77    Imaging No results found.   ASSESSMENT / PLAN:  PULMONARY A: COPD without acute exacerbation Acute resp failure, hypoxia High risk TRALI High risk uncompensated acidosis  pulm edema concerns  P:   Improved vent needs Tolerating SBTs.  Can extubate today if mental status is better Stop fentanyl sedation. Fluid removal by CVVH.  CARDIOVASCULAR CVL LIJ sheath 11/1 >>>  ALINE left fem 11/1>>> A:  Hemorrhagic shock resolved Blue toe syndrome PAD H/o HTN Trop leak, s/p cpr CVVHD, -2 L P:  Tolerating being off pressors.  Transfusion threshold hgb 7.0. Bleeding and DIC has improved.  RENAL A:   CKD Acute renal failure, ATN, post arrest , shock hypoK At risk abdo compartment, bladder pressure high but no elevated vent pressures or CVP No plan for repeat OR.  Hypernatremia Hypokalemia, 3.4 P:   BMET q12h CVVHD, - 100 cc/hr Replete electrolytes PRN  GASTROINTESTINAL A:   H/o PUD Active Gi bleed, likely all related to coags, fibrinogen, plat etc Improved bleeding Abdominal pressure improved to 13 on 11/6 P:   Off tube feeds for high residuals. PPI bid Low suction NGT.  HEMATOLOGIC Total: PRBC 16 FFP 3 Cryo 11 A:   Acute retroperitoneal hemorrhage s/p IR arteriogram  Consumptive / dilutional coagulapthy DIC improved with cryo P:  CBC to q24 Follow CBC, platelets, coags  INFECTIOUS A:     Afebrile, No acute issues  P:   Off all abx No fevers  ENDOCRINE A:   Chronic prednisone for polymyalgia rheumatica GI bleed P:   Steroids. Reduce hydrocortisone to 50 q12 Follow glucose on Bmet SSI  NEUROLOGIC A:   Acute metabolic encephalopathy up and down R/o anoxia, post arrest P:   RASS goal: 0 Monitor closely D/C fentanyl Avoid benzo  FAMILY  - Updates: family updated in full today 11/6 - Inter-disciplinary family meet or Palliative Care meeting due by:  11/7  Critical care time- 35 mins.  Chilton GreathousePraveen Marland Reine MD [facility] Pager [phone] If no answer or after 3pm call: [phone] 10/02/2015, 9:55 AM

## 2015-10-02 NOTE — Progress Notes (Signed)
Subjective: Interval History: none.. Remains critically ill on vent.  More alert this morning  Objective: Vital signs in last 24 hours: Temp:  [97.5 F (36.4 C)-98.3 F (36.8 C)] 98.1 F (36.7 C) (11/06 0806) Pulse Rate:  [78-100] 88 (11/06 0800) Resp:  [7-24] 16 (11/06 0800) BP: (102-161)/(43-71) 161/57 mmHg (11/06 0800) SpO2:  [95 %-100 %] 100 % (11/06 0800) Arterial Line BP: (101-157)/(41-67) 141/55 mmHg (11/06 0800) FiO2 (%):  [30 %] 30 % (11/06 0800) Weight:  [132 lb 4.4 oz (60 kg)] 132 lb 4.4 oz (60 kg) (11/06 0300)  Intake/Output from previous day: 11/05 0701 - 11/06 0700 In: 758 [I.V.:498; NG/GT:40] Out: 4715 [Urine:163; Emesis/NG output:350] Intake/Output this shift:    Abdomen soft and distended.  No specific tenderness.  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 Additional Medications: 5000 units of heparin. ANESTHESIA/SEDATION: Moderate sedation time: 40 minutes CONTRAST:  65 cc Visipaque 320 PROCEDURE: The procedure, risks, benefits, and alternatives were explained to the patient. Questions regarding the procedure were encouraged and answered. The patient understands and consents to the procedure. The right groin was prepped with Betadine in a sterile fashion, and a sterile drape was applied covering the operative field. A sterile gown and sterile gloves were used for the procedure. Under  sonographic guidance, a micropuncture needle was inserted into the right common femoral artery and removed over a 018 wire. A 4 French transitional dilator was inserted for a right lower extremity runoff. Insertion into the artery was performed at the mid femoral head prosthetic component. The transitional dilator was then exchanged for a 5 French sheath. A Kumpe be catheter was carefully advanced over a glidewire into the abdominal aorta. Initial attempts were made with a Bentson wire unsuccessfully. After some time, access across the common iliac artery lesion was obtained. The copy was exchanged over a Bentson wire for a Omni Flush catheter and iliac angiography was performed. The Omni Flush was then exchanged for the copy catheter again over the Bentson wire. Pressure measurements were obtained utilizing a pull back technique from the aorta to the external iliac artery. Mean arterial pressure in the aorta is 108 mm Hg and mean pressure in the external iliac artery was 104 mm Hg for a gradient of 4 mm Hg. The 5 French sheath was removed and hemostasis was achieved with direct pressure. At least 30 minutes had transpired after heparin administration. Exo seal was not utilized secondary to close proximity of the sheath to a femoral artery branch. There was no evidence of groin hematoma or bleeding from the entry site during the time of compression. Compression was held for approximately 20 minutes. After the patient was transferred to the outpatient recovery area up stairs, she developed hypotension and was immediately rushed to the CT scan for imaging. This demonstrated a large  retroperitoneal hemorrhage without active bleeding. She was resuscitated in the CT scan suite by the critical care physician and myself. FINDINGS: Right lower extremity runoff demonstrates patency of the common femoral, profunda femoral, superficial femoral, and popliteal arteries. The posterior tibial artery opacifies to the ankle. There  is very slow flow in the peroneal and anterior tibial arteries which eventually do reach the ankle. Right iliac angiography demonstrates irregular plaque in the common femoral artery. As described above, a small pressure gradient across this area was determined. COMPLICATIONS: The patient suffered a retroperitoneal hemorrhage after she was transferred to the outpatient area. IMPRESSION: Evaluation of the right common iliac artery lesion demonstrates irregular plaque but only a small pressure gradient. Stenting was not performed at this time. Optimal treatment will remain as anti-platelet and lipid lowering medication therapy. The patient did suffer a retroperitoneal hemorrhage after she was transferred to the outpatient recovery area. She was resuscitated and transferred to the ICU. Electronically Signed   By: Jolaine ClickArthur  Hoss M.D.   On: 09/29/2015 09:57   Ir [place] Guide Vasc Access Right  09/29/2015  CLINICAL DATA:  Right toe gangrene EXAM: RIGHT EXTREMITY ARTERIOGRAPHY; IR ULTRASOUND GUIDANCE VASC ACCESS RIGHT FLUOROSCOPY TIME:  7 minutes and 18 seconds MEDICATIONS AND MEDICAL HISTORY: Versed 1 mg, Fentanyl 50 mcg. Additional Medications: 5000 units of heparin. ANESTHESIA/SEDATION: Moderate sedation time: 40 minutes CONTRAST:  65 cc Visipaque 320 PROCEDURE: The procedure, risks, benefits, and alternatives were explained to the patient. Questions regarding the procedure were encouraged and answered. The patient understands and consents to the procedure. The right groin was prepped with Betadine in a sterile fashion, and a sterile drape was applied covering the operative field. A sterile gown and sterile gloves were used for the procedure. Under sonographic guidance, a micropuncture needle was inserted into the right common femoral artery and removed over a 018 wire. A 4 French transitional dilator was inserted for a right lower extremity runoff. Insertion into the artery was performed at the mid femoral head prosthetic  component. The transitional dilator was then exchanged for a 5 French sheath. A Kumpe be catheter was carefully advanced over a glidewire into the abdominal aorta. Initial attempts were made with a Bentson wire unsuccessfully. After some time, access across the common iliac artery lesion was obtained. The copy was exchanged over a Bentson wire for a Omni Flush catheter and iliac angiography was performed. The Omni Flush was then exchanged for the copy catheter again over the Bentson wire. Pressure measurements were obtained utilizing a pull back technique from the aorta to the external iliac artery. Mean arterial pressure in the aorta is 108 mm Hg and mean pressure in the external iliac artery was 104 mm Hg for a gradient of 4 mm Hg. The 5 French sheath was removed and hemostasis was achieved with direct pressure. At least 30 minutes had transpired after heparin administration. Exo seal was not utilized secondary to close proximity of the sheath to a femoral artery branch. There was no evidence of groin hematoma or bleeding from the entry site during the time of compression. Compression was held for approximately 20 minutes. After the patient was transferred to the outpatient recovery area up stairs, she developed hypotension and was immediately rushed to the CT scan for imaging. This demonstrated a large retroperitoneal hemorrhage without active bleeding. She was resuscitated in the CT scan suite by the critical care physician and myself. FINDINGS: Right lower extremity runoff demonstrates patency of the common femoral, profunda femoral, superficial femoral,  and popliteal arteries. The posterior tibial artery opacifies to the ankle. There is very slow flow in the peroneal and anterior tibial arteries which eventually do reach the ankle. Right iliac angiography demonstrates irregular plaque in the common femoral artery. As described above, a small pressure gradient across this area was determined. COMPLICATIONS: The  patient suffered a retroperitoneal hemorrhage after she was transferred to the outpatient area. IMPRESSION: Evaluation of the right common iliac artery lesion demonstrates irregular plaque but only a small pressure gradient. Stenting was not performed at this time. Optimal treatment will remain as anti-platelet and lipid lowering medication therapy. The patient did suffer a retroperitoneal hemorrhage after she was transferred to the outpatient recovery area. She was resuscitated and transferred to the ICU. Electronically Signed   By: Jolaine Click M.D.   On: 09/29/2015 09:57   Dg Chest Port 1 View  10/01/2015  CLINICAL DATA:  Acute respiratory failure. EXAM: PORTABLE CHEST 1 VIEW COMPARISON:  09/30/2015, 09/29/2015 and 09/28/2015 FINDINGS: Endotracheal tube is 17 mm above the carina. Central catheter is in good position just below the carina. NG tube tip is in the body of the stomach. Venous sheath tip is in the left innominate vein. There is persistent atelectasis at the left lung base. Small bilateral effusions. Heart size and pulmonary vascularity are normal. IMPRESSION: Persistent atelectasis and small effusion at the left base. Increased haziness at the right base is most likely a small posteriorly layered pleural effusion. Electronically Signed   By: Francene Boyers M.D.   On: 10/01/2015 08:32   Dg Chest Port 1 View  09/30/2015  CLINICAL DATA:  Acute respiratory failure. EXAM: PORTABLE CHEST 1 VIEW COMPARISON:  10/26/2015. FINDINGS: Endotracheal tube tip again noted at the carina. Proximal repositioning suggested. Bilateral IJ lines, NG tube in stable position. Mediastinum hilar structures are normal. Heart size stable. Low lung volumes with bibasilar atelectasis. Mild infiltrate left lung base cannot be excluded. Small left pleural effusion cannot be excluded. No pneumothorax . IMPRESSION: 1. Endotracheal tube tip remains at the carina. Proximal repositioning of approximately 2 to 3 cm suggested.  Remaining lines and tubes in stable position. 2. Persistent bibasilar atelectasis. Mild infiltrate left lung base cannot be excluded. Critical Value/emergent results were called by telephone at the time of interpretation on 09/30/2015 at 7:22 am to nurse Abby, who verbally acknowledged these results. Electronically Signed   By: Maisie Fus  Register   On: 09/30/2015 07:23   Dg Chest Port 1 View  09/29/2015  CLINICAL DATA:  Central line placement. Acute respiratory failure. Cardiac arrest. EXAM: PORTABLE CHEST 1 VIEW COMPARISON:  Prior today FINDINGS: A new right jugular central venous catheter is seen with tip overlying the superior cavoatrial junction. No pneumothorax visualized. Endotracheal tube tip is seen at the level of the carina. Nasogastric tube is seen entering the stomach. Persistent left lower lobe atelectasis or consolidation is again seen. Mild bilateral perihilar atelectasis is also stable in appearance. IMPRESSION: New right jugular central venous catheter in appropriate position. No pneumothorax visualized. Low endotracheal tube position, with tip at level of carina. No significant change in left lower lobe atelectasis or consolidation and mild bilateral perihilar atelectasis. These results were discussed by telephone at the time of interpretation on 09/29/2015 at 1:05 pm with Dr. Rory Percy , who verbally acknowledged these results. Electronically Signed   By: Myles Rosenthal M.D.   On: 09/29/2015 13:06   Dg Chest Port 1 View  09/29/2015  CLINICAL DATA:  Acute respiratory failure. Cardiac arrest.  On ventilator. EXAM: PORTABLE CHEST 1 VIEW COMPARISON:  09/28/2015 FINDINGS: Endotracheal tube tip is now in the proximal right mainstem bronchus. Nasogastric tube seen within the stomach. Left jugular Cordis remains in place. Low lung volumes are again noted. Mild increase in left lower lobe atelectasis or consolidation noted. Small left pleural effusion cannot be excluded. Bilateral perihilar  atelectasis also mildly increased since prior study. No pneumothorax visualized. Heart size remains within normal limits. IMPRESSION: Low endotracheal tube position, with tip now overlying the proximal right mainstem bronchus. Mild increase in left lower lobe atelectasis or consolidation, and bilateral perihilar atelectasis. These results will be called to the ordering clinician or representative by the Radiologist Assistant, and communication documented in the PACS or zVision Dashboard. Electronically Signed   By: Myles Rosenthal M.D.   On: 09/29/2015 12:48   Dg Chest Port 1 View  09/28/2015  CLINICAL DATA:  Bedside central venous catheter placement. Ventilator dependent respiratory failure. Followup left pleural effusion. EXAM: PORTABLE CHEST 1 VIEW 1625 hr: COMPARISON:  Chest x-ray earlier same 1053 hr and previously. FINDINGS: Left jugular central venous catheter tip projects at or near the cavoatrial junction. No evidence of pneumothorax or mediastinal hematoma. Endotracheal tube tip remains in satisfactory position approximately 3 cm above the carina. Nasogastric tube tip in the stomach. Small to moderate-sized left pleural effusion and associated consolidation of the left lower lobe, unchanged since earlier in the day. Suboptimal inspiration which accounts for atelectasis at the right lung base. IMPRESSION: 1. Left jugular central venous catheter tip projects at or near the cavoatrial junction. No complicating features. 2. Stable small moderate size left pleural effusion and associated atelectasis versus pneumonia in the left lower lobe. 3. Suboptimal inspiration accounts for right basilar atelectasis. Electronically Signed   By: Hulan Saas M.D.   On: 09/28/2015 16:34   Dg Chest Port 1 View  09/28/2015  CLINICAL DATA:  Respiratory distress.  Pulmonary edema. EXAM: PORTABLE CHEST 1 VIEW COMPARISON:  10/17/2015 FINDINGS: Endotracheal tube is been inserted and is in good position. NG tube has been  inserted and the tip is in the stomach. Central venous sheath tip is in the proximal left innominate vein, unchanged. Heart size and pulmonary vascularity are normal. New small left pleural effusion and probable slight atelectasis at the left base. IMPRESSION: New small left effusion and atelectasis at the left lung base. Tubes in good position. Electronically Signed   By: Francene Boyers M.D.   On: 09/28/2015 11:21   Dg Chest Port 1 View  10/04/2015  CLINICAL DATA:  Code blue.  Endotracheal tube placement. EXAM: PORTABLE CHEST 1 VIEW COMPARISON:  02/09/2015 FINDINGS: Endotracheal tube placed with tip measuring 2.6 cm above the carina. There appears to be a central vascular sheath over the left jugular vein with tip projected over the upper mediastinum. This could be localized in the brachiocephalic vein or arterial structure. Normal heart size and pulmonary vascularity. Shallow inspiration. Slight atelectasis in the left lung base. No focal consolidation. No pneumothorax. No blunting of costophrenic angles. Acutea right and left lower rib fractures. IMPRESSION: Appliances positioned as described. Shallow inspiration. Acute rib fractures. No pneumothorax or focal consolidation in the lungs. Electronically Signed   By: Burman Nieves M.D.   On: 10/21/2015 20:18   Dg Chest Port 1 View  09/29/2015  CLINICAL DATA:  Bedside central venous catheter placement. Current history of blue toe syndrome EXAM: PORTABLE CHEST 1 VIEW COMPARISON:  02/09/2015 and earlier. FINDINGS: Left jugular introducer sheath tip projects  over the left innominate vein. No evidence of pneumothorax or mediastinal hematoma. Suboptimal inspiration. Cardiac silhouette normal in size, unchanged. Prominent bronchovascular markings diffusely and central peribronchial thickening, unchanged, with likely bronchiectasis in the right lower lobe. Lungs otherwise clear. No pleural effusions. IMPRESSION: 1. Left jugular introducer sheath tip projects over  the left innominate vein. 2. Suboptimal inspiration.  No acute cardiopulmonary disease. Electronically Signed   By: Hulan Saas M.D.   On: 10/23/2015 16:09   Dg Abd Portable 1v  09/29/2015  CLINICAL DATA:  Abdominal bleed. EXAM: PORTABLE ABDOMEN - 1 VIEW COMPARISON:  CT 09/30/2015. FINDINGS: NG tube noted with tip projected over the stomach. No bowel distention. No free air. Degenerative changes scoliosis lumbar spine. Right hip replacement. Surgical staples over the right abdomen. Bibasilar subsegmental atelectasis. IMPRESSION: NG tube tip noted projected over the stomach. No bowel distention. No free air. Electronically Signed   By: Maisie Fus  Register   On: 09/29/2015 07:00   Ct Angio Abd/pel W/ And/or W/o  10/06/2015  CLINICAL DATA:  Hypotension after angiogram EXAM: CTA ABDOMEN AND PELVIS wITHOUT AND WITH CONTRAST TECHNIQUE: Multidetector CT imaging of the abdomen and pelvis was performed using the standard protocol during bolus administration of intravenous contrast. Multiplanar reconstructed images and MIPs were obtained and reviewed to evaluate the vascular anatomy. CONTRAST:  100 cc Omnipaque 350 COMPARISON:  None. FINDINGS: There is a large retroperitoneal hemorrhage extending from the right inguinal region into the right retroperitoneum towards the right upper quadrant. Contrast images demonstrate no pseudoaneurysm or active contrast extravasation. Plaque along the right common iliac artery is again noted. There is no dissection or focal thrombosis. Atherosclerotic changes of the celiac origin SMA origin, and bilateral renal arteries are stable. IMA is diminutive and patent. There is a small amount of hemoperitoneum about the liver. The bladder is deviated to the left secondary to the retroperitoneal hemorrhage. Right total hip arthroplasty is noted. Kidneys are within normal limits. Spleen pancreas and adrenal glands are unremarkable Several cysts throughout the liver are noted. Review of the MIP  images confirms the above findings. IMPRESSION: Large right retroperitoneal hemorrhage extending from the right inguinal region after angiogram. There is no active extravasation or bleeding. There is no pseudoaneurysm. Electronically Signed   By: Jolaine Click M.D.   On: 10/01/2015 17:53   Anti-infectives: Anti-infectives    Start     Dose/Rate Route Frequency Ordered Stop   10/01/2015 1200  ceFAZolin (ANCEF) IVPB 2 g/50 mL premix     2 g 100 mL/hr over 30 Minutes Intravenous  Once 10/13/2015 1153 10/10/2015 1223      Assessment/Plan: s/p * No procedures listed * Slow progression regarding overall hemodynamics.  Now off all pressors.  No sign of renal recovery.  Following with you.   LOS: 5 days   Alecxander Mainwaring 10/02/2015, 9:15 AM

## 2015-10-03 ENCOUNTER — Inpatient Hospital Stay (HOSPITAL_COMMUNITY): Payer: Commercial Managed Care - HMO

## 2015-10-03 LAB — POCT I-STAT 3, ART BLOOD GAS (G3+)
ACID-BASE EXCESS: 3 mmol/L — AB (ref 0.0–2.0)
BICARBONATE: 27.4 meq/L — AB (ref 20.0–24.0)
O2 Saturation: 98 %
PO2 ART: 97 mmHg (ref 80.0–100.0)
Patient temperature: 98.6
TCO2: 29 mmol/L (ref 0–100)
pCO2 arterial: 39.7 mmHg (ref 35.0–45.0)
pH, Arterial: 7.446 (ref 7.350–7.450)

## 2015-10-03 LAB — CBC
HCT: 28.6 % — ABNORMAL LOW (ref 36.0–46.0)
HEMOGLOBIN: 9.5 g/dL — AB (ref 12.0–15.0)
MCH: 29.5 pg (ref 26.0–34.0)
MCHC: 33.2 g/dL (ref 30.0–36.0)
MCV: 88.8 fL (ref 78.0–100.0)
Platelets: 63 10*3/uL — ABNORMAL LOW (ref 150–400)
RBC: 3.22 MIL/uL — ABNORMAL LOW (ref 3.87–5.11)
RDW: 17.8 % — AB (ref 11.5–15.5)
WBC: 13 10*3/uL — AB (ref 4.0–10.5)

## 2015-10-03 LAB — COMPREHENSIVE METABOLIC PANEL
ALBUMIN: 2.5 g/dL — AB (ref 3.5–5.0)
ALK PHOS: 96 U/L (ref 38–126)
ALT: 103 U/L — ABNORMAL HIGH (ref 14–54)
ANION GAP: 8 (ref 5–15)
AST: 117 U/L — ABNORMAL HIGH (ref 15–41)
BILIRUBIN TOTAL: 13.5 mg/dL — AB (ref 0.3–1.2)
BUN: 18 mg/dL (ref 6–20)
CALCIUM: 7.6 mg/dL — AB (ref 8.9–10.3)
CO2: 25 mmol/L (ref 22–32)
Chloride: 103 mmol/L (ref 101–111)
Creatinine, Ser: 0.95 mg/dL (ref 0.44–1.00)
GFR calc Af Amer: >60 mL/min (ref >60)
GFR, EST NON AFRICAN AMERICAN: 56 mL/min — AB (ref >60)
Glucose, Bld: 96 mg/dL (ref 65–99)
Potassium: 4.7 mmol/L (ref 3.5–5.1)
Sodium: 136 mmol/L (ref 135–145)
TOTAL PROTEIN: 5.7 g/dL — AB (ref 6.5–8.1)

## 2015-10-03 LAB — MAGNESIUM
MAGNESIUM: 2.5 mg/dL — AB (ref 1.7–2.4)
MAGNESIUM: 2.5 mg/dL — AB (ref 1.7–2.4)

## 2015-10-03 LAB — GLUCOSE, CAPILLARY
GLUCOSE-CAPILLARY: 84 mg/dL (ref 65–99)
GLUCOSE-CAPILLARY: 106 mg/dL — AB (ref 65–99)
Glucose-Capillary: 85 mg/dL (ref 65–99)
Glucose-Capillary: 88 mg/dL (ref 65–99)
Glucose-Capillary: 89 mg/dL (ref 65–99)
Glucose-Capillary: 89 mg/dL (ref 65–99)

## 2015-10-03 LAB — PHOSPHORUS
PHOSPHORUS: 3.2 mg/dL (ref 2.5–4.6)
Phosphorus: 2 mg/dL — ABNORMAL LOW (ref 2.5–4.6)

## 2015-10-03 LAB — PROCALCITONIN: Procalcitonin: 2.8 ng/mL

## 2015-10-03 MED ORDER — DEXMEDETOMIDINE HCL IN NACL 200 MCG/50ML IV SOLN
0.0000 ug/kg/h | INTRAVENOUS | Status: DC
  Administered 2015-10-03: 0.4 ug/kg/h via INTRAVENOUS
  Filled 2015-10-03: qty 50

## 2015-10-03 MED ORDER — HYDRALAZINE HCL 20 MG/ML IJ SOLN: 5.0000 mg | Freq: Four times a day (QID) | INTRAMUSCULAR | Status: DC | PRN

## 2015-10-03 MED ORDER — DEXMEDETOMIDINE HCL IN NACL 400 MCG/100ML IV SOLN
0.0000 ug/kg/h | INTRAVENOUS | Status: DC
  Administered 2015-10-03: 0.01 ug/kg/h via INTRAVENOUS
  Administered 2015-10-04 (×2): 0.8 ug/kg/h via INTRAVENOUS
  Administered 2015-10-05: 0.799 ug/kg/h via INTRAVENOUS
  Administered 2015-10-05 (×2): 1.2 ug/kg/h via INTRAVENOUS
  Administered 2015-10-06: 1 ug/kg/h via INTRAVENOUS
  Administered 2015-10-06 (×3): 1.2 ug/kg/h via INTRAVENOUS
  Administered 2015-10-07 (×3): 0.9 ug/kg/h via INTRAVENOUS
  Administered 2015-10-08 (×4): 1 ug/kg/h via INTRAVENOUS
  Administered 2015-10-09 (×4): 1.1 ug/kg/h via INTRAVENOUS
  Administered 2015-10-10: 1 ug/kg/h via INTRAVENOUS
  Administered 2015-10-10 (×2): 1.1 ug/kg/h via INTRAVENOUS
  Administered 2015-10-11: 0.5 ug/kg/h via INTRAVENOUS
  Administered 2015-10-11: 0.7 ug/kg/h via INTRAVENOUS
  Administered 2015-10-11: 0.702 ug/kg/h via INTRAVENOUS
  Filled 2015-10-03 (×5): qty 100
  Filled 2015-10-03: qty 200
  Filled 2015-10-03 (×21): qty 100

## 2015-10-03 MED ORDER — SODIUM PHOSPHATE 3 MMOLE/ML IV SOLN
20.0000 mmol | Freq: Once | INTRAVENOUS | Status: AC
  Administered 2015-10-03: 20 mmol via INTRAVENOUS
  Filled 2015-10-03: qty 6.67

## 2015-10-03 NOTE — Progress Notes (Signed)
Referring Physician(s): Dr Caryl Never  Chief Complaint:  Retroperitoneal bleed post IR procedure 11/1  Subjective:  Pt still on vent Did wean for few hrs both days over weekend Small amount urine this am Does try to follow commands---slight movement of B feet -- Noted able to bring hands and arms off bed  Allergies: Meloxicam and Meperidine hcl  Medications: Prior to Admission medications   Medication Sig Start Date End Date Taking? Authorizing Provider  albuterol (PROVENTIL HFA;VENTOLIN HFA) 108 (90 BASE) MCG/ACT inhaler Inhale 2 puffs into the lungs every 4 (four) hours as needed for wheezing. 06/22/15 04/18/17 Yes Kristian Covey, MD  aspirin EC 81 MG tablet Take 81 mg by mouth daily.   Yes Historical Provider, MD  atorvastatin (LIPITOR) 10 MG tablet Take 1 tablet (10 mg total) by mouth daily. 08/25/15  Yes Kristian Covey, MD  Biotin 1000 MCG tablet Take 1,000 mcg by mouth daily.   Yes Historical Provider, MD  Calcium Carb-Cholecalciferol (CALCIUM 1000 + D PO) Take 1 tablet by mouth daily.    Yes Historical Provider, MD  clopidogrel (PLAVIX) 75 MG tablet Take 1 tablet (75 mg total) by mouth daily. 08/22/15  Yes Kristian Covey, MD  cyanocobalamin 500 MCG tablet Take 500 mcg by mouth daily.   Yes Historical Provider, MD  latanoprost (XALATAN) 0.005 % ophthalmic solution Place 1 drop into the right eye at bedtime.     Yes Historical Provider, MD  mometasone [facility]) 220 MCG/INH inhaler Inhale 2 puffs into the lungs daily. 01/05/15  Yes Kristian Covey, MD  Multiple Vitamins-Minerals (OCUVITE PO) Take by mouth.   Yes Historical Provider, MD  oxyCODONE-acetaminophen (PERCOCET) 10-325 MG per tablet Take 1 tablet by mouth every 6 (six) hours as needed for pain. 07/28/15 07/27/16 Yes Kristian Covey, MD  pantoprazole (PROTONIX) 40 MG tablet TAKE 1 TABLET (40 MG TOTAL) BY MOUTH DAILY. 07/14/15  Yes Kristian Covey, MD  predniSONE (DELTASONE) 10 MG tablet Take 10 mg by mouth daily  with breakfast.   Yes Historical Provider, MD  sertraline (ZOLOFT) 100 MG tablet Take 0.5 tablets (50 mg total) by mouth daily. 07/14/15  Yes Kristian Covey, MD  triamterene-hydrochlorothiazide (DYAZIDE) 37.5-25 MG capsule TAKE ONE CAPSULE BY MOUTH EVERY MORNING 08/29/15  Yes Kristian Covey, MD     Vital Signs: BP 116/52 mmHg  Pulse 79  Temp(Src) 97.5 F (36.4 C) (Oral)  Resp 17  Ht 5' (1.524 m)  Wt 128 lb 1.4 oz (58.1 kg)  BMI 25.02 kg/m2  SpO2 100%  Physical Exam  Pulmonary/Chest:  vent  Abdominal: Soft. Bowel sounds are normal.  Genitourinary:  210 cc UOP today  Musculoskeletal:  Noted can move B feet to command---so slightly Able to raise both hands and arms up to command Surgical skin site clean and dry Rt foot 1+pulse Rt foot toes 2,3,4 all blue/back- gangrenous Lt foot 1+ pulses  Skin: Skin is warm.  Nursing note and vitals reviewed.   Imaging: Dg Chest Port 1 View  10/03/2015  CLINICAL DATA:  Acute respiratory failure EXAM: PORTABLE CHEST 1 VIEW COMPARISON:  Two days ago FINDINGS: Endotracheal tube tip is 5 mm above the carina. Right IJ dialysis catheter and left neck sheath are in stable position. An orogastric tube reaches the stomach at least. Improved lower lung aeration, especially on the right. Persistent dense retrocardiac opacity with possible air bronchograms. No edema, significant effusion, or pneumothorax. Normal heart size. IMPRESSION: 1. Low endotracheal tube with  tip 5 mm above the carina. 2. Mild improvement basilar lung aeration with residual retrocardiac atelectasis or pneumonia. Electronically Signed   By: Marnee Spring M.D.   On: 10/03/2015 05:20   Dg Chest Port 1 View  10/01/2015  CLINICAL DATA:  Acute respiratory failure. EXAM: PORTABLE CHEST 1 VIEW COMPARISON:  09/30/2015, 09/29/2015 and 09/28/2015 FINDINGS: Endotracheal tube is 17 mm above the carina. Central catheter is in good position just below the carina. NG tube tip is in the body of  the stomach. Venous sheath tip is in the left innominate vein. There is persistent atelectasis at the left lung base. Small bilateral effusions. Heart size and pulmonary vascularity are normal. IMPRESSION: Persistent atelectasis and small effusion at the left base. Increased haziness at the right base is most likely a small posteriorly layered pleural effusion. Electronically Signed   By: Francene Boyers M.D.   On: 10/01/2015 08:32   Dg Chest Port 1 View  09/30/2015  CLINICAL DATA:  Acute respiratory failure. EXAM: PORTABLE CHEST 1 VIEW COMPARISON:  10/26/2015. FINDINGS: Endotracheal tube tip again noted at the carina. Proximal repositioning suggested. Bilateral IJ lines, NG tube in stable position. Mediastinum hilar structures are normal. Heart size stable. Low lung volumes with bibasilar atelectasis. Mild infiltrate left lung base cannot be excluded. Small left pleural effusion cannot be excluded. No pneumothorax . IMPRESSION: 1. Endotracheal tube tip remains at the carina. Proximal repositioning of approximately 2 to 3 cm suggested. Remaining lines and tubes in stable position. 2. Persistent bibasilar atelectasis. Mild infiltrate left lung base cannot be excluded. Critical Value/emergent results were called by telephone at the time of interpretation on 09/30/2015 at 7:22 am to nurse Abby, who verbally acknowledged these results. Electronically Signed   By: Maisie Fus  Register   On: 09/30/2015 07:23   Dg Chest Port 1 View  09/29/2015  CLINICAL DATA:  Central line placement. Acute respiratory failure. Cardiac arrest. EXAM: PORTABLE CHEST 1 VIEW COMPARISON:  Prior today FINDINGS: A new right jugular central venous catheter is seen with tip overlying the superior cavoatrial junction. No pneumothorax visualized. Endotracheal tube tip is seen at the level of the carina. Nasogastric tube is seen entering the stomach. Persistent left lower lobe atelectasis or consolidation is again seen. Mild bilateral perihilar  atelectasis is also stable in appearance. IMPRESSION: New right jugular central venous catheter in appropriate position. No pneumothorax visualized. Low endotracheal tube position, with tip at level of carina. No significant change in left lower lobe atelectasis or consolidation and mild bilateral perihilar atelectasis. These results were discussed by telephone at the time of interpretation on 09/29/2015 at 1:05 pm with Dr. Rory Percy , who verbally acknowledged these results. Electronically Signed   By: Myles Rosenthal M.D.   On: 09/29/2015 13:06   Dg Chest Port 1 View  09/29/2015  CLINICAL DATA:  Acute respiratory failure. Cardiac arrest. On ventilator. EXAM: PORTABLE CHEST 1 VIEW COMPARISON:  09/28/2015 FINDINGS: Endotracheal tube tip is now in the proximal right mainstem bronchus. Nasogastric tube seen within the stomach. Left jugular Cordis remains in place. Low lung volumes are again noted. Mild increase in left lower lobe atelectasis or consolidation noted. Small left pleural effusion cannot be excluded. Bilateral perihilar atelectasis also mildly increased since prior study. No pneumothorax visualized. Heart size remains within normal limits. IMPRESSION: Low endotracheal tube position, with tip now overlying the proximal right mainstem bronchus. Mild increase in left lower lobe atelectasis or consolidation, and bilateral perihilar atelectasis. These results will be called to the  ordering clinician or representative by the Radiologist Assistant, and communication documented in the PACS or zVision Dashboard. Electronically Signed   By: Myles RosenthalJohn  Stahl M.D.   On: 09/29/2015 12:48    Labs:  CBC:  Recent Labs  10/01/15 0400 10/01/15 1542 10/02/15 0429 10/03/15 0350  WBC 18.1* 17.0* 15.6* 13.0*  HGB 10.0* 9.4* 9.3* 9.5*  HCT 29.8* 28.3* 28.0* 28.6*  PLT 54* 54* 52* 63*    COAGS:  Recent Labs  Jul 10, 2015 1030 Jul 10, 2015 1900 Jul 10, 2015 2324  09/28/15 1805 09/29/15 0938 09/30/15 0619  10/01/15 0400  INR 0.96 2.07* 3.30*  < > 1.66* 1.67* 1.52* 1.35  APTT 21* 56* 118*  --   --   --   --   --   < > = values in this interval not displayed.  BMP:  Recent Labs  10/01/15 1540 10/02/15 0429 10/02/15 1640 10/03/15 0350  NA 137 137 138 136  K 4.1 4.5 4.6 4.7  CL 104 102 102 103  CO2 25 26 24 25   GLUCOSE 100* 95 88 96  BUN 17 16 18 18   CALCIUM 7.4* 7.6* 8.0* 7.6*  CREATININE 1.50* 1.34* 1.07* 0.95  GFRNONAA 32* 37* 48* 56*  GFRAA 37* 43* 56* >60    LIVER FUNCTION TESTS:  Recent Labs  09/30/15 0619  10/01/15 0400 10/01/15 1540 10/02/15 0429 10/02/15 1640 10/03/15 0350  BILITOT 2.2*  --  5.1*  --  8.7*  --  13.5*  AST 609*  --  351*  --  180*  --  117*  ALT 232*  --  183*  --  126*  --  103*  ALKPHOS 53  --  74  --  81  --  96  PROT 4.6*  --  5.8*  --  5.6*  --  5.7*  ALBUMIN 2.3*  < > 2.9* 2.7* 2.5* 2.7* 2.5*  < > = values in this interval not displayed.  Assessment and Plan:  IR procedure 11/1 Right common iliac arteriogram No stent needed Post Retroperitoneal bleed Evacuation in OR 11/2 Code/PEA Still on vent Will follow and report to Dr Bonnielee HaffHoss   Signed: Kambry Takacs A 10/03/2015, 8:18 AM   I spent a total of 15 Minutes at the the patient's bedside AND on the patient's hospital floor or unit, greater than 50% of which was counseling/coordinating care for RP bleed post IR procedure 11/1

## 2015-10-03 NOTE — Progress Notes (Signed)
Hypoglycemic Event  CBG: 69  Treatment: D50 IV 25 mL  Symptoms: None  Follow-up CBG: Time:1958 CBG Result:129  Possible Reasons for Event: Inadequate meal intake  Comments/MD notified:none    Andy GaussHayes, Karyss Frese Patrick

## 2015-10-03 NOTE — Progress Notes (Signed)
PULMONARY / CRITICAL CARE MEDICINE   Name: [name] Herman MRN: [id number] DOB: 10/30/1935    ADMISSION DATE:  10/14/2015 CONSULTATION DATE:  10/05/2015  REFERRING MD :  Dr. Bonnielee HaffHoss, IR  CHIEF COMPLAINT:  RP bleeding after IR procedure  INITIAL PRESENTATION: 80 year old female who [facility] for iliac artery stent placement. Procedure was done, no stent needed. Post-operatively she complained of nausea and had hypotension. Distended abdomen. CT with with retroperitoneal blood, not actively bleeding per Dr. Bonnielee HaffHoss. To ICU for PCCM management.   STUDIES:  CT (A) abd/pelvis (11/1) >>> Large right retroperitoneal hemorrhage extending from the right inguinal region after angiogram. There is no active extravasation or bleeding. There is no pseudoaneurysm. Lower extremity arterial duplex (11/1) No evidence of psuedoaneurysm in the right groin. A hematoma is noted measuring 3.55cm. CXR (11/3): No significant change in left lower lobe atelectasis or consolidation and mild bilateral perihilar atelectasis CXR (11/4): Endotracheal tube tip remains at the carina. Proximal repositioning of approximately 2 to 3 cm suggested. Remaining lines and tubes in stable position. Persistent bibasilar atelectasis. Mild infiltrate left lung base cannot be excluded. CXR (11/7): 1. Low endotracheal tube with tip 5 mm above the carina. 2. Mild improvement basilar lung aeration with residual retrocardiac atelectasis or pneumonia.  SIGNIFICANT EVENTS: 11/1 to IR for iliac stent, stent not needed, post op RP blood. Shock. To ICU 11/1 coded pea 10 min , OR for evacuation hematoma and iliac repair  11/2- GI bleeding, renal called 11/3- less bleeding from OGT, no pressors 11/4- HD transitioned to CVVH  SUBJECTIVE:  Patient sedated. Doing well on weaning trial. Hypoglycemic to 69 overnight.  VITAL SIGNS: Temp:  [97.5 F (36.4 C)-98.1 F (36.7 C)] 97.5 F (36.4 C) (11/07 0339) Pulse  Rate:  [80-108] 83 (11/07 0600) Resp:  [12-32] 15 (11/07 0600) BP: (104-182)/(45-83) 123/54 mmHg (11/07 0600) SpO2:  [100 %] 100 % (11/07 0600) Arterial Line BP: (94-190)/(39-81) 114/46 mmHg (11/07 0600) FiO2 (%):  [30 %] 30 % (11/07 0600) Weight:  [128 lb 1.4 oz (58.1 kg)-132 lb 4.4 oz (60 kg)] 128 lb 1.4 oz (58.1 kg) (11/07 0500) HEMODYNAMICS: CVP:  [2 mmHg-8 mmHg] 2 mmHg VENTILATOR SETTINGS: Vent Mode:  [-] PRVC FiO2 (%):  [30 %] 30 % Set Rate:  [12 bmp] 12 bmp Vt Set:  [400 mL] 400 mL PEEP:  [5 cmH20] 5 cmH20 Pressure Support:  [5 cmH20] 5 cmH20 Plateau Pressure:  [11 cmH20-16 cmH20] 16 cmH20 INTAKE / OUTPUT:  Intake/Output Summary (Last 24 hours) at 10/03/15 0659 Last data filed at 10/03/15 0600  Gross per 24 hour  Intake 470.75 ml  Output   3614 ml  Net -3143.25 ml    PHYSICAL EXAMINATION: General:  Vented, eyes open  Neuro:  Patient awake, PERRLA HEENT: No thryomegaly, JVD Cardiovascular:  S1, S2, RRR Lungs: Clear to auscultation bilaterally, intubated Abdomen:  Soft, + BS. Oozing from abd incision slowing.  Musculoskeletal:  Upper ext edema rt dopplerable, left palpable Skin:  Necrotic appearing R toes, third toe appears slightly darker than previous exam.   Photos (09/29/05)     LABS:  CBC  Recent Labs Lab 10/01/15 1542 10/02/15 0429 10/03/15 0350  WBC 17.0* 15.6* 13.0*  HGB 9.4* 9.3* 9.5*  HCT 28.3* 28.0* 28.6*  PLT 54* 52* 63*   Coag's  Recent Labs Lab 10/24/2015 1030 10/17/2015 1900 09/28/2015 2324  09/29/15 0938 09/30/15 0619 10/01/15 0400  APTT 21* 56* 118*  --   --   --   --  INR 0.96 2.07* 3.30*  < > 1.67* 1.52* 1.35  < > = values in this interval not displayed. BMET  Recent Labs Lab 10/02/15 0429 10/02/15 1640 10/03/15 0350  NA 137 138 136  K 4.5 4.6 4.7  CL 102 102 103  CO2 BUN CREATININE 1.34* 1.07* 0.95  GLUCOSE 95 88 96   Electrolytes  Recent Labs Lab 10/02/15 0429 10/02/15 1640 10/03/15 0350    CALCIUM 7.6* 8.0* 7.6*  MG 2.4 2.5* 2.5*  PHOS 2.1* 1.9*  1.9* 2.0*   Sepsis Markers  Recent Labs Lab 09/28/15 0121 09/28/15 0410 10/01/15 0130  LATICACIDVEN 14.3* 15.2* 1.6   ABG  Recent Labs Lab 09/29/15 0710 09/30/15 0528 10/01/15 0325  PHART 7.514* 7.396 7.337*  PCO2ART 28.0* 40.6 44.1  PO2ART 79.5* 80.0 92.1   Liver Enzymes  Recent Labs Lab 10/01/15 0400  10/02/15 0429 10/02/15 1640 10/03/15 0350  AST 351*  --  180*  --  117*  ALT 183*  --  126*  --  103*  ALKPHOS 74  --  81  --  96  BILITOT 5.1*  --  8.7*  --  13.5*  ALBUMIN 2.9*  < > 2.5* 2.7* 2.5*  < > = values in this interval not displayed. Cardiac Enzymes  Recent Labs Lab 09/28/15 0113 09/28/15 0330 09/28/15 0731  TROPONINI 0.41* 0.34* 0.61*   Glucose  Recent Labs Lab 10/02/15 1230 10/02/15 1557 10/02/15 1935 10/02/15 1958 10/02/15 2344 10/03/15 0338  GLUCAP 77 80 69 129* 89 88    Imaging Dg Chest Port 1 View  10/03/2015  CLINICAL DATA:  Acute respiratory failure EXAM: PORTABLE CHEST 1 VIEW COMPARISON:  Two days ago FINDINGS: Endotracheal tube tip is 5 mm above the carina. Right IJ dialysis catheter and left neck sheath are in stable position. An orogastric tube reaches the stomach at least. Improved lower lung aeration, especially on the right. Persistent dense retrocardiac opacity with possible air bronchograms. No edema, significant effusion, or pneumothorax. Normal heart size. IMPRESSION: 1. Low endotracheal tube with tip 5 mm above the carina. 2. Mild improvement basilar lung aeration with residual retrocardiac atelectasis or pneumonia. Electronically Signed   By: Marnee Spring M.D.   On: 10/03/2015 05:20     ASSESSMENT / PLAN:  PULMONARY A: COPD without acute exacerbation Acute resp failure, hypoxia High risk TRALI High risk uncompensated acidosis  pulm edema concerns  P:   Improved vent needs Tolerating SBTs.  Will consider extubation today if mental status is  better Fentanyl resumed yesterday afternoon Fluid removal by CVVH.  CARDIOVASCULAR CVL LIJ sheath 11/1 >>>  ALINE left fem 11/1>>> A:  Hemorrhagic shock resolved Blue toe syndrome PAD H/o HTN Trop leak, s/p cpr CVVHD, -2.9 L P:  Tolerating being off pressors.  Transfusion threshold hgb 7.0. Bleeding and DIC has improved.  RENAL A:   CKD Acute renal failure, ATN, post arrest , shock At risk abdo compartment, bladder pressure high but no elevated vent pressures or CVP No plan for repeat OR.  Hypernatremia Hypokalemia, improved Hypophos, 2.0  P:   BMET q12h CVVHD, - 100 cc/hr Replete electrolytes PRN  GASTROINTESTINAL A:   H/o PUD Active GI bleed, likely all related to coags, fibrinogen, plat etc Improved bleeding Abdominal pressure improved to 13 on 11/6 P:   Off tube feeds for high residuals. PPI bid Low suction NGT.  HEMATOLOGIC Total: PRBC 16 FFP 3 Cryo 11 A:   Acute retroperitoneal  hemorrhage s/p IR arteriogram  Consumptive / dilutional coagulapthy DIC improved with cryo Hgb stable 9.5, Platelets 63, WBC 13.0 P:  CBC to q24 Follow CBC, platelets, coags  INFECTIOUS A:   Afebrile, No acute issues  P:   Off all abx  ENDOCRINE A:   Chronic prednisone for polymyalgia rheumatica GI bleed P:   Hydrocortisone to 50 mg q12 Follow glucose on Bmet SSI  NEUROLOGIC A:   Acute metabolic encephalopathy up and down R/o anoxia, post arrest P:   RASS goal: 0 Monitor closely Fentanyl resumed yesterday afternoon Avoid benzo  FAMILY  - Updates: family updated in full today 11/6 - Inter-disciplinary family meet or Palliative Care meeting due by:  11/7  Ashly M. Nadine Counts, DO PGY-2, [facility] 10/03/2015, 6:59 AM

## 2015-10-03 NOTE — Progress Notes (Addendum)
  [facility] Progress Note  Subjective  - POD #5  Sedated on vent.   Objective Filed Vitals:   10/03/15 0900  BP: 158/74  Pulse: 103  Temp:   Resp: 21    Intake/Output Summary (Last 24 hours) at 10/03/15 0941 Last data filed at 10/03/15 0909  Gross per 24 hour  Intake 475.75 ml  Output   3295 ml  Net -2819.25 ml   Abdomen distended but soft Dry gangrene to right 2-4th toes. No evidence of infection. Palpable left DP pulse. Right foot warm.   Assessment/Planning: 80 y.o. female is s/p: Evacuation of retroperitoneal hematoma, repair of right external iliac artery 6 Days Post-Op   Possible extubation today.  Stable dry gangrenous changes to right toes.  Abdominal pressure improved 11/6.  Platelets trending up to 63 today. Hgb 9.5.  BP stable off pressors. AKI: CRRT stopped per renal. Making little urine. Hopefully kidney function will recover.   Raymond GurneyKimberly A Trinh 10/03/2015 9:41 AM -- Agree with above.  Still with slow progress in the right direction.  Abdominal dressings still with some sero sanguinous drainage.  Toes unchanged.  Agree with nutritional support.  She is following commands some.  Hopefully off vent with recovery of renal function soon.  Fabienne Brunsharles Fields, MD [facility] [place] Office: [phone] Pager: [phone]  Laboratory CBC    Component Value Date/Time   WBC 13.0* 10/03/2015 0350   HGB 9.5* 10/03/2015 0350   HCT 28.6* 10/03/2015 0350   PLT 63* 10/03/2015 0350    BMET    Component Value Date/Time   NA 136 10/03/2015 0350   K 4.7 10/03/2015 0350   CL 103 10/03/2015 0350   CO2 25 10/03/2015 0350   GLUCOSE 96 10/03/2015 0350   BUN 18 10/03/2015 0350   CREATININE 0.95 10/03/2015 0350   CREATININE 1.07* 08/02/2015 1427   CALCIUM 7.6* 10/03/2015 0350   GFRNONAA 56* 10/03/2015 0350   GFRAA >60 10/03/2015 0350    COAG Lab Results  Component Value Date   INR 1.35 10/01/2015   INR 1.52*  09/30/2015   INR 1.67* 09/29/2015   No results found for: PTT  Antibiotics Anti-infectives    Start     Dose/Rate Route Frequency Ordered Stop   10/21/2015 1200  ceFAZolin (ANCEF) IVPB 2 g/50 mL premix     2 g 100 mL/hr over 30 Minutes Intravenous  Once 10/02/2015 1153 10/11/2015 1223       Maris BergerKimberly Trinh, PA-C [facility] Office: [phone] Pager: [phone] 10/03/2015 9:41 AM

## 2015-10-03 NOTE — Progress Notes (Signed)
Patient ID: [name] FlamingMartha H Fess, female   DOB: 04/27/1935, 80 y.o.   MRN: [id number]  I agree with the earlier note by midlevel.   Currently, she is still on the vent. Her BP, pulse and Hb levels have been stable and abdominal pressures are near normal. She is still making minimal urine. Continuous dialysis ended this morning, and hopefully, her renal function will return. Abdomen is nondistended. Her right 2-4 toes still exhibit dry gangrene, without evidence on infection. This may need to be addressed with debridement. I will defer to vascular surgery.

## 2015-10-03 NOTE — Progress Notes (Signed)
**  Critical Care Interval Note**  Patient intubated.  BP elevated 170/77, HR 124 on exam.  Patient with low grade fever 100.30F.  Procalcitonin today 2.80.  WBCs actually improving today.  Patient is not on antibiotics.  Patient has as needed hydralazine 5 mg ordered.  Will continue to monitor patient overnight.  Mathayus Stanbery M. Nadine CountsGottschalk, DO PGY-2, [facility]

## 2015-10-03 NOTE — Progress Notes (Signed)
Subjective:  Hypoglycemia overnight- otherwise stable- making a little urine Objective Vital signs in last 24 hours: Filed Vitals:   10/03/15 0500 10/03/15 0600 10/03/15 0700 10/03/15 0721  BP: 145/59 123/54 116/52 116/52  Pulse: 90 83 80 79  Temp:      TempSrc:      Resp: Height:      Weight: 58.1 kg (128 lb 1.4 oz)     SpO2: 100% 100% 100% 100%   Weight change: 0 kg (0 lb)  Intake/Output Summary (Last 24 hours) at 10/03/15 0732 Last data filed at 10/03/15 0700  Gross per 24 hour  Intake 475.75 ml  Output   3615 ml  Net -3139.25 ml    Assessment/ Plan: Pt is a 80 y.o. yo female who was admitted on 10/20/2015 with hemorrhagic shock/PEA arrest after PAD intervention with resultant AKI  Assessment/Plan: 1. AKI- due to contrast nephropathy and ATN due to hemorrhagic shock and PEA arrest- has been CRRT dependent since 11/4.  Baseline creatinine in September was 1.07.  Right now labs are WNL, she is making a little urine and volume status as well seems OK with FIO2 of 30 and CVP of 8.  I will go ahead and stop CRRT and follow.   AM hopeful for eventual recovery 2. HTN/vol=- CVP down and FIo2 30% - minimal drips 3. Anemia- after hemorrhagic shock- RPH- no heparin with CRRT- hgb holding at 9's 4. Elytes- stable for now 5. VDRF- per CCM  Sanjna Haskew A    Labs: Basic Metabolic Panel:  Recent Labs Lab 10/02/15 0429 10/02/15 1640 10/03/15 0350  NA 137 138 136  K 4.5 4.6 4.7  CL 102 102 103  CO2 GLUCOSE 95 88 96  BUN CREATININE 1.34* 1.07* 0.95  CALCIUM 7.6* 8.0* 7.6*  PHOS 2.1* 1.9*  1.9* 2.0*   Liver Function Tests:  Recent Labs Lab 10/01/15 0400  10/02/15 0429 10/02/15 1640 10/03/15 0350  AST 351*  --  180*  --  117*  ALT 183*  --  126*  --  103*  ALKPHOS 74  --  81  --  96  BILITOT 5.1*  --  8.7*  --  13.5*  PROT 5.8*  --  5.6*  --  5.7*  ALBUMIN 2.9*  < > 2.5* 2.7* 2.5*  < > = values in this interval not  displayed. No results for input(s): LIPASE, AMYLASE in the last 168 hours. No results for input(s): AMMONIA in the last 168 hours. CBC:  Recent Labs Lab 10/04/2015 1640 10/14/2015 1900  10/01/15 0130 10/01/15 0400 10/01/15 1542 10/02/15 0429 10/03/15 0350  WBC 27.1* 13.8*  < > 18.9* 18.1* 17.0* 15.6* 13.0*  NEUTROABS 21.9* 10.6*  --   --   --   --   --   --   HGB 12.7 5.6*  < > 10.3* 10.0* 9.4* 9.3* 9.5*  HCT 37.2 17.0*  < > 30.3* 29.8* 28.3* 28.0* 28.6*  MCV 88.6 89.5  < > 87.1 87.6 87.9 88.3 88.8  PLT 216 115*  < > 59* 54* 54* 52* 63*  < > = values in this interval not displayed. Cardiac Enzymes:  Recent Labs Lab 10/02/2015 1640 10/22/2015 1950 09/28/15 0113 09/28/15 0330 09/28/15 0731 09/28/15 1805 10/01/15 0130  CKTOTAL  --   --   --   --   --  9528* 2174*  CKMB  --   --   --   --   --   --  36.5*  TROPONINI <0.03 0.13* 0.41* 0.34* 0.61*  --   --    CBG:  Recent Labs Lab 10/02/15 1557 10/02/15 1935 10/02/15 1958 10/02/15 2344 10/03/15 0338  GLUCAP 80 69 129* 89 88    Iron Studies: No results for input(s): IRON, TIBC, TRANSFERRIN, FERRITIN in the last 72 hours. Studies/Results: Dg Chest Port 1 View  10/03/2015  CLINICAL DATA:  Acute respiratory failure EXAM: PORTABLE CHEST 1 VIEW COMPARISON:  Two days ago FINDINGS: Endotracheal tube tip is 5 mm above the carina. Right IJ dialysis catheter and left neck sheath are in stable position. An orogastric tube reaches the stomach at least. Improved lower lung aeration, especially on the right. Persistent dense retrocardiac opacity with possible air bronchograms. No edema, significant effusion, or pneumothorax. Normal heart size. IMPRESSION: 1. Low endotracheal tube with tip 5 mm above the carina. 2. Mild improvement basilar lung aeration with residual retrocardiac atelectasis or pneumonia. Electronically Signed   By: Marnee SpringJonathon  Watts M.D.   On: 10/03/2015 05:20   Medications: Infusions: . sodium chloride 10 mL/hr at 10/01/15  2000  . feeding supplement (VITAL AF 1.2 CAL) Stopped (10/01/15 0600)  . fentaNYL infusion INTRAVENOUS 100 mcg/hr (10/03/15 0700)  . furosemide (LASIX) infusion Stopped (09/29/15 1200)  . dialysis replacement fluid (prismasate) 1,000 mL/hr at 10/03/15 0315  . dialysis replacement fluid (prismasate) 300 mL/hr at 10/01/15 2200  . dialysate (PRISMASATE) 1,000 mL/hr at 10/03/15 0300    Scheduled Medications: . antiseptic oral rinse  7 mL Mouth Rinse QID  . budesonide  0.25 mg Nebulization Q12H  . chlorhexidine gluconate  15 mL Mouth Rinse BID  . fentaNYL (SUBLIMAZE) injection  50 mcg Intravenous Once  . hydrocortisone sod succinate (SOLU-CORTEF) inj  50 mg Intravenous Q12H  . insulin aspart  0-9 Units Subcutaneous 6 times per day  . ipratropium-albuterol  3 mL Nebulization Q4H  . pantoprazole (PROTONIX) IV  40 mg Intravenous Q12H    have reviewed scheduled and prn medications.  Physical Exam: General: eyes open- no commands- jaundiced Heart: RRR Lungs: CBS bilat Abdomen: distended Extremities: some pitting edema- black toes on right Dialysis Access: right IJ vascath placed 11/4    10/03/2015,7:32 AM  LOS: 6 days

## 2015-10-04 ENCOUNTER — Inpatient Hospital Stay (HOSPITAL_COMMUNITY): Payer: Commercial Managed Care - HMO

## 2015-10-04 LAB — CBC
HCT: 25.5 % — ABNORMAL LOW (ref 36.0–46.0)
Hemoglobin: 8.3 g/dL — ABNORMAL LOW (ref 12.0–15.0)
MCH: 28.9 pg (ref 26.0–34.0)
MCHC: 32.5 g/dL (ref 30.0–36.0)
MCV: 88.9 fL (ref 78.0–100.0)
PLATELETS: 73 10*3/uL — AB (ref 150–400)
RBC: 2.87 MIL/uL — ABNORMAL LOW (ref 3.87–5.11)
RDW: 18.1 % — ABNORMAL HIGH (ref 11.5–15.5)
WBC: 11.7 10*3/uL — AB (ref 4.0–10.5)

## 2015-10-04 LAB — COMPREHENSIVE METABOLIC PANEL
ALK PHOS: 162 U/L — AB (ref 38–126)
ALT: 87 U/L — AB (ref 14–54)
AST: 127 U/L — AB (ref 15–41)
Albumin: 2.1 g/dL — ABNORMAL LOW (ref 3.5–5.0)
Anion gap: 11 (ref 5–15)
BUN: 53 mg/dL — AB (ref 6–20)
CALCIUM: 7.7 mg/dL — AB (ref 8.9–10.3)
CO2: 24 mmol/L (ref 22–32)
CREATININE: 2.76 mg/dL — AB (ref 0.44–1.00)
Chloride: 105 mmol/L (ref 101–111)
GFR, EST AFRICAN AMERICAN: 18 mL/min — AB (ref >60)
GFR, EST NON AFRICAN AMERICAN: 15 mL/min — AB (ref >60)
Glucose, Bld: 140 mg/dL — ABNORMAL HIGH (ref 65–99)
Potassium: 4.8 mmol/L (ref 3.5–5.1)
SODIUM: 140 mmol/L (ref 135–145)
Total Bilirubin: 16.9 mg/dL — ABNORMAL HIGH (ref 0.3–1.2)
Total Protein: 5 g/dL — ABNORMAL LOW (ref 6.5–8.1)

## 2015-10-04 LAB — GLUCOSE, CAPILLARY
GLUCOSE-CAPILLARY: 116 mg/dL — AB (ref 65–99)
GLUCOSE-CAPILLARY: 131 mg/dL — AB (ref 65–99)
Glucose-Capillary: 123 mg/dL — ABNORMAL HIGH (ref 65–99)
Glucose-Capillary: 152 mg/dL — ABNORMAL HIGH (ref 65–99)
Glucose-Capillary: 140 mg/dL — ABNORMAL HIGH (ref 65–99)
Glucose-Capillary: 145 mg/dL — ABNORMAL HIGH (ref 65–99)

## 2015-10-04 LAB — PHOSPHORUS
PHOSPHORUS: 3.4 mg/dL (ref 2.5–4.6)
Phosphorus: 3.8 mg/dL (ref 2.5–4.6)

## 2015-10-04 LAB — PROCALCITONIN: PROCALCITONIN: 3.92 ng/mL

## 2015-10-04 LAB — MAGNESIUM
MAGNESIUM: 2.6 mg/dL — AB (ref 1.7–2.4)
Magnesium: 2.6 mg/dL — ABNORMAL HIGH (ref 1.7–2.4)

## 2015-10-04 MED ORDER — HALOPERIDOL LACTATE 5 MG/ML IJ SOLN
5.0000 mg | Freq: Once | INTRAMUSCULAR | Status: AC
  Administered 2015-10-04: 5 mg via INTRAVENOUS
  Filled 2015-10-04: qty 1

## 2015-10-04 NOTE — Progress Notes (Addendum)
  [facility] Progress Note  Subjective  - POD #6  Sedated on vent. Not tolerating vent wean.   Objective Filed Vitals:   10/04/15 0915  BP:   Pulse: 114  Temp:   Resp: 19    Intake/Output Summary (Last 24 hours) at 10/04/15 0945 Last data filed at 10/04/15 0900  Gross per 24 hour  Intake 1157.95 ml  Output    300 ml  Net 857.95 ml   Abdomen distended but soft. Midline abdominal incisions and RLQ oblique incisions clean. Some superficial skin tears RLQ. Dry gangrenous changes to right 2nd, 3rd and 4th toes.   Assessment/Planning: 80 y.o. female is s/p: evacuation of retroperitoneal hematoma, repair of right external iliac artery.  7 Days Post-Op   Febrile overnight, but resolved. Not currently on abx.  Did not tolerate vent wean this am.  AKI: 205 cc urine output. Renal following and not CRRT still held. Hopefully kidney function will recover.  Stable dry gangrene right toes.  Bladder pressure 15 today.   Raymond GurneyKimberly A Trinh 10/04/2015 9:45 AM -- Findings as above.  No real events over last 24 hours Some serosanguinous drainage from RLQ incision continue to follow for now.  High risk for hernia but would not open wound or address this in pt current state Dr Imogene Burnhen will follow pt while I am away this week.  Fabienne Brunsharles Darely Becknell, MD [facility] [place] Office: [phone] Pager: [phone]  Laboratory CBC    Component Value Date/Time   WBC 11.7* 10/04/2015 0540   HGB 8.3* 10/04/2015 0540   HCT 25.5* 10/04/2015 0540   PLT 73* 10/04/2015 0540    BMET    Component Value Date/Time   NA 140 10/04/2015 0540   K 4.8 10/04/2015 0540   CL 105 10/04/2015 0540   CO2 24 10/04/2015 0540   GLUCOSE 140* 10/04/2015 0540   BUN 53* 10/04/2015 0540   CREATININE 2.76* 10/04/2015 0540   CREATININE 1.07* 08/02/2015 1427   CALCIUM 7.7* 10/04/2015 0540   GFRNONAA 15* 10/04/2015 0540   GFRAA 18* 10/04/2015 0540    COAG Lab Results    Component Value Date   INR 1.35 10/01/2015   INR 1.52* 09/30/2015   INR 1.67* 09/29/2015   No results found for: PTT  Antibiotics Anti-infectives    Start     Dose/Rate Route Frequency Ordered Stop   10/25/2015 1200  ceFAZolin (ANCEF) IVPB 2 g/50 mL premix     2 g 100 mL/hr over 30 Minutes Intravenous  Once 10/11/2015 1153 09/28/2015 1223       Maris BergerKimberly Trinh, PA-C [facility] Office: [phone] Pager: [phone] 10/04/2015 9:45 AM

## 2015-10-04 NOTE — Progress Notes (Signed)
Pulled ETT back 2 cm. per MD.

## 2015-10-04 NOTE — Progress Notes (Signed)
Referring Physician Fields  Chief Complaint:  Retroperitoneal bleed post IR procedure 11/1  Subjective:  On vent Not tolerating weaning UOP 205 cc yesterday Minimal today No movement today Not arousable  Allergies: Meloxicam and Meperidine hcl  Medications: Prior to Admission medications   Medication Sig Start Date End Date Taking? Authorizing Provider  albuterol (PROVENTIL HFA;VENTOLIN HFA) 108 (90 BASE) MCG/ACT inhaler Inhale 2 puffs into the lungs every 4 (four) hours as needed for wheezing. 06/22/15 04/18/17 Yes Kristian CoveyBruce W Burchette, MD  aspirin EC 81 MG tablet Take 81 mg by mouth daily.   Yes Historical Provider, MD  atorvastatin (LIPITOR) 10 MG tablet Take 1 tablet (10 mg total) by mouth daily. 08/25/15  Yes Kristian CoveyBruce W Burchette, MD  Biotin 1000 MCG tablet Take 1,000 mcg by mouth daily.   Yes Historical Provider, MD  Calcium Carb-Cholecalciferol (CALCIUM 1000 + D PO) Take 1 tablet by mouth daily.    Yes Historical Provider, MD  clopidogrel (PLAVIX) 75 MG tablet Take 1 tablet (75 mg total) by mouth daily. 08/22/15  Yes Kristian CoveyBruce W Burchette, MD  cyanocobalamin 500 MCG tablet Take 500 mcg by mouth daily.   Yes Historical Provider, MD  latanoprost (XALATAN) 0.005 % ophthalmic solution Place 1 drop into the right eye at bedtime.     Yes Historical Provider, MD  mometasone [facility](ASMANEX) 220 MCG/INH inhaler Inhale 2 puffs into the lungs daily. 01/05/15  Yes Kristian CoveyBruce W Burchette, MD  Multiple Vitamins-Minerals (OCUVITE PO) Take by mouth.   Yes Historical Provider, MD  oxyCODONE-acetaminophen (PERCOCET) 10-325 MG per tablet Take 1 tablet by mouth every 6 (six) hours as needed for pain. 07/28/15 07/27/16 Yes Kristian CoveyBruce W Burchette, MD  pantoprazole (PROTONIX) 40 MG tablet TAKE 1 TABLET (40 MG TOTAL) BY MOUTH DAILY. 07/14/15  Yes Kristian CoveyBruce W Burchette, MD  predniSONE (DELTASONE) 10 MG tablet Take 10 mg by mouth daily with breakfast.   Yes Historical Provider, MD  sertraline (ZOLOFT) 100 MG tablet Take 0.5 tablets  (50 mg total) by mouth daily. 07/14/15  Yes Kristian CoveyBruce W Burchette, MD  triamterene-hydrochlorothiazide (DYAZIDE) 37.5-25 MG capsule TAKE ONE CAPSULE BY MOUTH EVERY MORNING 08/29/15  Yes Kristian CoveyBruce W Burchette, MD     Vital Signs: BP 149/72 mmHg  Pulse 104  Temp(Src) 98.8 F (37.1 C) (Oral)  Resp 18  Ht 5' (1.524 m)  Wt 127 lb 10.3 oz (57.9 kg)  BMI 24.93 kg/m2  SpO2 99%  Physical Exam  Constitutional:  On vent sedated  Musculoskeletal:  Rt toes 2/3/4 all gangrenous 1+ pulse Skin warm and pink abd wounds followed by Dr Darrick PennaFields   Skin: Skin is warm.  Nursing note and vitals reviewed.   Imaging: Dg Chest Port 1 View  10/04/2015  CLINICAL DATA:  Hypoxia EXAM: PORTABLE CHEST 1 VIEW COMPARISON:  October 03, 2015 FINDINGS: Endotracheal tube tip is essentially at the carina, very little change from 1 day prior. Central catheter tip is in the superior vena cava. Nasogastric tube tip and side port are below the diaphragm. No pneumothorax. There is patchy bibasilar atelectasis. Lungs elsewhere clear. Heart size and pulmonary vascularity are normal. No adenopathy. IMPRESSION: Tube and catheter positions as described without pneumothorax. Note that the endotracheal tube tip is essentially at the carina. It may be prudent to consider withdrawing endotracheal tube 2-3 cm. Patchy bibasilar atelectasis. No new opacity. No change in cardiac silhouette. These results will be called to the ordering clinician or representative by the Radiologist Assistant, and communication documented in the PACS  or zVision Dashboard. Electronically Signed   By: Bretta Bang III M.D.   On: 10/04/2015 07:16   Dg Chest Port 1 View  10/03/2015  CLINICAL DATA:  Acute respiratory failure EXAM: PORTABLE CHEST 1 VIEW COMPARISON:  Two days ago FINDINGS: Endotracheal tube tip is 5 mm above the carina. Right IJ dialysis catheter and left neck sheath are in stable position. An orogastric tube reaches the stomach at least. Improved lower  lung aeration, especially on the right. Persistent dense retrocardiac opacity with possible air bronchograms. No edema, significant effusion, or pneumothorax. Normal heart size. IMPRESSION: 1. Low endotracheal tube with tip 5 mm above the carina. 2. Mild improvement basilar lung aeration with residual retrocardiac atelectasis or pneumonia. Electronically Signed   By: Marnee Spring M.D.   On: 10/03/2015 05:20   Dg Chest Port 1 View  10/01/2015  CLINICAL DATA:  Acute respiratory failure. EXAM: PORTABLE CHEST 1 VIEW COMPARISON:  09/30/2015, 09/29/2015 and 09/28/2015 FINDINGS: Endotracheal tube is 17 mm above the carina. Central catheter is in good position just below the carina. NG tube tip is in the body of the stomach. Venous sheath tip is in the left innominate vein. There is persistent atelectasis at the left lung base. Small bilateral effusions. Heart size and pulmonary vascularity are normal. IMPRESSION: Persistent atelectasis and small effusion at the left base. Increased haziness at the right base is most likely a small posteriorly layered pleural effusion. Electronically Signed   By: Francene Boyers M.D.   On: 10/01/2015 08:32    Labs:  CBC:  Recent Labs  10/01/15 1542 10/02/15 0429 10/03/15 0350 10/04/15 0540  WBC 17.0* 15.6* 13.0* 11.7*  HGB 9.4* 9.3* 9.5* 8.3*  HCT 28.3* 28.0* 28.6* 25.5*  PLT 54* 52* 63* 73*    COAGS:  Recent Labs  2015/10/06 1030 Oct 06, 2015 1900 10/06/2015 2324  09/28/15 1805 09/29/15 0938 09/30/15 0619 10/01/15 0400  INR 0.96 2.07* 3.30*  < > 1.66* 1.67* 1.52* 1.35  APTT 21* 56* 118*  --   --   --   --   --   < > = values in this interval not displayed.  BMP:  Recent Labs  10/02/15 0429 10/02/15 1640 10/03/15 0350 10/04/15 0540  NA 137 138 136 140  K 4.5 4.6 4.7 4.8  CL 102 102 103 105  CO2 GLUCOSE 95 88 96 140*  BUN 53*  CALCIUM 7.6* 8.0* 7.6* 7.7*  CREATININE 1.34* 1.07* 0.95 2.76*  GFRNONAA 37* 48* 56* 15*  GFRAA  43* 56* >60 18*    LIVER FUNCTION TESTS:  Recent Labs  10/01/15 0400  10/02/15 0429 10/02/15 1640 10/03/15 0350 10/04/15 0540  BILITOT 5.1*  --  8.7*  --  13.5* 16.9*  AST 351*  --  180*  --  117* 127*  ALT 183*  --  126*  --  103* 87*  ALKPHOS 74  --  81  --  96 162*  PROT 5.8*  --  5.6*  --  5.7* 5.0*  ALBUMIN 2.9*  < > 2.5* 2.7* 2.5* 2.1*  < > = values in this interval not displayed.  Assessment and Plan:  IR will continue to follow with Critical Care  and Dr Darrick Penna   Signed: Ralene Muskrat A 10/04/2015, 2:21 PM   I spent a total of 15 Minutes at the the patient's bedside AND on the patient's hospital floor or unit, greater than 50% of which was counseling/coordinating care for  post procedure RP bleed

## 2015-10-04 NOTE — Progress Notes (Signed)
PULMONARY / CRITICAL CARE MEDICINE   Name: [name] Herman MRN: [id number] DOB: Oct 25, 1935    ADMISSION DATE:  Oct 21, 2015 CONSULTATION DATE:  10-21-2015  REFERRING MD :  Dr. Bonnielee Haff, IR  CHIEF COMPLAINT:  RP bleeding after IR procedure  INITIAL PRESENTATION: 80 year old female who presented to [facility] Oct 21, 2015 for iliac artery stent placement. Procedure was done, no stent needed. Post-operatively she complained of nausea and had hypotension. Distended abdomen. CT with with retroperitoneal blood, not actively bleeding per Dr. Bonnielee Haff. To ICU for PCCM management.   STUDIES:  CT (A) abd/pelvis October 21, 2023) >>> Large right retroperitoneal hemorrhage extending from the right inguinal region after angiogram. There is no active extravasation or bleeding. There is no pseudoaneurysm. Lower extremity arterial duplex 10-21-2023) No evidence of psuedoaneurysm in the right groin. A hematoma is noted measuring 3.55cm. CXR (11/3): No significant change in left lower lobe atelectasis or consolidation and mild bilateral perihilar atelectasis CXR (11/4): Endotracheal tube tip remains at the carina. Proximal repositioning of approximately 2 to 3 cm suggested. Remaining lines and tubes in stable position. Persistent bibasilar atelectasis. Mild infiltrate left lung base cannot be excluded. CXR (11/7): 1. Low endotracheal tube with tip 5 mm above the carina. 2. Mild improvement basilar lung aeration with residual retrocardiac atelectasis or pneumonia. CXR (11/8): Tube and catheter positions as described without pneumothorax. Note that the endotracheal tube tip is essentially at the carina. It may be prudent to consider withdrawing endotracheal tube 2-3 cm  SIGNIFICANT EVENTS: 10/21/23 to IR for iliac stent, stent not needed, post op RP blood. Shock. To ICU 2023/10/21 coded pea 10 min , OR for evacuation hematoma and iliac repair  11/2- GI bleeding, renal called 11/3- less bleeding from OGT, no pressors 11/4- HD  transitioned to CVVH 11/7- CRRT stopped  SUBJECTIVE:  Patient awake. Not tolerated weaning trial well.  VITAL SIGNS: Temp:  [98.3 F (36.8 C)-101 F (38.3 C)] 99.4 F (37.4 C) (11/08 0341) Pulse Rate:  [79-133] 91 (11/08 0600) Resp:  [12-30] 12 (11/08 0600) BP: (86-172)/(38-84) 86/50 mmHg (11/08 0600) SpO2:  [93 %-100 %] 98 % (11/08 0600) Arterial Line BP: (69-177)/(33-79) 79/34 mmHg (11/08 0600) FiO2 (%):  [30 %] 30 % (11/08 0401) Weight:  [127 lb 10.3 oz (57.9 kg)-128 lb 1.4 oz (58.1 kg)] 127 lb 10.3 oz (57.9 kg) (11/08 0500) HEMODYNAMICS: CVP:  [0 mmHg-9 mmHg] 4 mmHg VENTILATOR SETTINGS: Vent Mode:  [-] PRVC FiO2 (%):  [30 %] 30 % Set Rate:  [12 bmp] 12 bmp Vt Set:  [400 mL] 400 mL PEEP:  [5 cmH20] 5 cmH20 Pressure Support:  [5 cmH20] 5 cmH20 Plateau Pressure:  [14 cmH20-17 cmH20] 17 cmH20 INTAKE / OUTPUT:  Intake/Output Summary (Last 24 hours) at 10/04/15 0635 Last data filed at 10/04/15 0600  Gross per 24 hour  Intake 1078.21 ml  Output    474 ml  Net 604.21 ml    PHYSICAL EXAMINATION:  General:  Weaning trial, eyes open  Neuro:  Patient awake, PERRLA, restless HEENT: No thryomegaly, JVD Cardiovascular:  S1, S2, RRR Lungs: Clear to auscultation bilaterally, intubated Abdomen:  Soft, + BS. Abdominal bandages in place Musculoskeletal:  Lower extremities without edema, see skin Skin:  Necrotic appearing R toes, extent of necrosis seems to be increasing in all 3 toes compared to previous exam.  Photos (10/03/05)      LABS:  CBC  Recent Labs Lab 10/02/15 0429 10/03/15 0350 10/04/15 0540  WBC 15.6* 13.0* 11.7*  HGB 9.3*  9.5* 8.3*  HCT 28.0* 28.6* 25.5*  PLT 52* 63* 73*   Coag's  Recent Labs Lab 10/15/2015 1030 10/09/2015 1900 10/06/2015 2324  09/29/15 0938 09/30/15 0619 10/01/15 0400  APTT 21* 56* 118*  --   --   --   --   INR 0.96 2.07* 3.30*  < > 1.67* 1.52* 1.35  < > = values in this interval not displayed. BMET  Recent Labs Lab  10/02/15 0429 10/02/15 1640 10/03/15 0350  NA 137 138 136  K 4.5 4.6 4.7  CL 102 102 103  CO2 BUN CREATININE 1.34* 1.07* 0.95  GLUCOSE 95 88 96   Electrolytes  Recent Labs Lab 10/02/15 0429 10/02/15 1640 10/03/15 0350 10/03/15 1621 10/03/15 1622 10/04/15 0315  CALCIUM 7.6* 8.0* 7.6*  --   --   --   MG 2.4 2.5* 2.5* 2.5*  --  2.6*  PHOS 2.1* 1.9*  1.9* 2.0*  --  3.2  --    Sepsis Markers  Recent Labs Lab 09/28/15 0121 09/28/15 0410 10/01/15 0130 10/03/15 1053  LATICACIDVEN 14.3* 15.2* 1.6  --   PROCALCITON  --   --   --  2.80   ABG  Recent Labs Lab 09/30/15 0528 10/01/15 0325 10/03/15 1023  PHART 7.396 7.337* 7.446  PCO2ART 40.6 44.1 39.7  PO2ART 80.0 92.1 97.0   Liver Enzymes  Recent Labs Lab 10/01/15 0400  10/02/15 0429 10/02/15 1640 10/03/15 0350  AST 351*  --  180*  --  117*  ALT 183*  --  126*  --  103*  ALKPHOS 74  --  81  --  96  BILITOT 5.1*  --  8.7*  --  13.5*  ALBUMIN 2.9*  < > 2.5* 2.7* 2.5*  < > = values in this interval not displayed. Cardiac Enzymes  Recent Labs Lab 09/28/15 0113 09/28/15 0330 09/28/15 0731  TROPONINI 0.41* 0.34* 0.61*   Glucose  Recent Labs Lab 10/03/15 0815 10/03/15 1203 10/03/15 1546 10/03/15 1931 10/03/15 2333 10/04/15 0344  GLUCAP 85 89 84 106* 116* 123*    Imaging No results found.   ASSESSMENT / PLAN:  PULMONARY A: COPD without acute exacerbation Acute resp failure, hypoxia High risk TRALI High risk uncompensated acidosis  pulm edema concerns  P:   Improved vent needs Tolerating SBTs.  Did not tolerate wean this am. Fentanyl  CVVH dc'd 11/7  CARDIOVASCULAR CVL LIJ sheath 11/1 >>>  ALINE left fem 11/1>>> A:  Hemorrhagic shock resolved Blue toe syndrome PAD H/o HTN Trop leak, s/p cpr P:  Tolerating being off pressors.  Transfusion threshold hgb 7.0. Bleeding and DIC has improved.  RENAL A:   CKD Acute renal failure, ATN, post arrest ,  shock At risk abdo compartment, bladder pressure high but no elevated vent pressures or CVP No plan for repeat OR.  Hypernatremia, improved Hypokalemia, improved Hypophos, improved Cr back up to 2.76 P:   Daily BMET Replete electrolytes as needed Consider resuming CVVHD  GASTROINTESTINAL A:   H/o PUD Active GI bleed, likely all related to coags, fibrinogen, plat etc Improved bleeding Abdominal pressure improved to 15 on 11/8 P:   TF resumed 11/7 PPI bid  HEMATOLOGIC Total: PRBC 16 FFP 3 Cryo 11 A:   Acute retroperitoneal hemorrhage s/p IR arteriogram  Consumptive / dilutional coagulapthy DIC improved with cryo Hgb stable 8.3, Platelets 73, WBC 11.7 P:  CBC to q24 Follow CBC, platelets, coags  INFECTIOUS A:  Low grade fever to 101F overnight.  Resolved independently.  Do not suspect infectious etiology at this time.  WBC improving.  PCT rising slightly from 2.8>3.92.  P:   Off all abx  ENDOCRINE A:   Chronic prednisone for polymyalgia rheumatica GI bleed P:   Hydrocortisone to 50 mg q12 Follow glucose on Bmet SSI  NEUROLOGIC A:   Acute metabolic encephalopathy up and down R/o anoxia, post arrest P:   RASS goal: 0 Monitor closely Fentanyl resumed yesterday afternoon Avoid benzo  FAMILY  - Updates: family updated in full 11/6 - Inter-disciplinary family meet or Palliative Care meeting due by:  11/7  Juliane Guest M. Nadine CountsGottschalk, DO PGY-2, [facility] 10/04/2015, 6:35 AM

## 2015-10-04 NOTE — Progress Notes (Signed)
**  Critical Care Interval Note**  BP tenuous, 70/37, HR 100.  Suspect that sedation medications are playing a large role.  RN reports that BP is elevated at low doses of pain/sedation meds and swing to low pressures when meds are increased slightly.  Concern for difficulty weaning if does not get adequate rest.  Agree with allowing for some rest, would caution that very low BPs may inhibit adequate perfusion to viral organs.  Low grade temp to 101F.  Will continue to monitor closely.    Donika Butner M. Nadine CountsGottschalk, DO PGY-2, [facility]

## 2015-10-04 NOTE — Progress Notes (Signed)
Subjective:  Febrile and hypotensive overnight- only 205 cc of UOP Objective Vital signs in last 24 hours: Filed Vitals:   10/04/15 0600 10/04/15 0615 10/04/15 0630 10/04/15 0645  BP: 86/50     Pulse: 91 108 113 114  Temp:      TempSrc:      Resp: Height:      Weight:      SpO2: 98% 100% 99% 98%   Weight change: -1.9 kg (-4 lb 3 oz)  Intake/Output Summary (Last 24 hours) at 10/04/15 4540 Last data filed at 10/04/15 0600  Gross per 24 hour  Intake 1058.21 ml  Output    305 ml  Net 753.21 ml    Assessment/ Plan: Pt is a 80 y.o. yo female who was admitted on October 21, 2015 with hemorrhagic shock/PEA arrest after PAD intervention with resultant AKI  Assessment/Plan: 1. AKI- due to contrast nephropathy and ATN due to hemorrhagic shock and PEA arrest- has been CRRT dependent from 11/4-11/7.  Baseline creatinine in September was 1.07.  Right now labs are worse but there are no indications to resume any renal replacement therapy. she is making a little urine and volume status as well seems OK with FIO2 of 30 and CVP in single digits.   AM hopeful for eventual recovery.  Will give it some more time to plateau and possibly improve 2. HTN/vol=- CVP down and FIo2 30% - Feeling like low BP has to do with sedation- it does get higher at times 3. Anemia- after hemorrhagic shock- RPH- hgb dropping slowly 4. Elytes- stable for now 5. VDRF- per CCM 6. Fever- per CCM- no abx, possibly due to hematoma- wbc coming down  Jackilyn Umphlett A    Labs: Basic Metabolic Panel:  Recent Labs Lab 10/02/15 1640 10/03/15 0350 10/03/15 1622 10/04/15 0540  NA 138 136  --  140  K 4.6 4.7  --  4.8  CL 102 103  --  105  CO2 24 25  --  24  GLUCOSE 88 96  --  140*  BUN 18 18  --  53*  CREATININE 1.07* 0.95  --  2.76*  CALCIUM 8.0* 7.6*  --  7.7*  PHOS 1.9*  1.9* 2.0* 3.2 3.8   Liver Function Tests:  Recent Labs Lab 10/02/15 0429 10/02/15 1640 10/03/15 0350 10/04/15 0540  AST 180*   --  117* 127*  ALT 126*  --  103* 87*  ALKPHOS 81  --  96 162*  BILITOT 8.7*  --  13.5* 16.9*  PROT 5.6*  --  5.7* 5.0*  ALBUMIN 2.5* 2.7* 2.5* 2.1*   No results for input(s): LIPASE, AMYLASE in the last 168 hours. No results for input(s): AMMONIA in the last 168 hours. CBC:  Recent Labs Lab 10-21-15 1640 2015/10/21 1900  10/01/15 0400 10/01/15 1542 10/02/15 0429 10/03/15 0350 10/04/15 0540  WBC 27.1* 13.8*  < > 18.1* 17.0* 15.6* 13.0* 11.7*  NEUTROABS 21.9* 10.6*  --   --   --   --   --   --   HGB 12.7 5.6*  < > 10.0* 9.4* 9.3* 9.5* 8.3*  HCT 37.2 17.0*  < > 29.8* 28.3* 28.0* 28.6* 25.5*  MCV 88.6 89.5  < > 87.6 87.9 88.3 88.8 88.9  PLT 216 115*  < > 54* 54* 52* 63* 73*  < > = values in this interval not displayed. Cardiac Enzymes:  Recent Labs Lab 2015/10/21 1640 10-21-15 1950 09/28/15 0113 09/28/15 0330 09/28/15  [id number] 09/28/15 1805 10/01/15 0130  CKTOTAL  --   --   --   --   --  1781* 2174*  CKMB  --   --   --   --   --   --  36.5*  TROPONINI <0.03 0.13* 0.41* 0.34* 0.61*  --   --    CBG:  Recent Labs Lab 10/03/15 1203 10/03/15 1546 10/03/15 1931 10/03/15 2333 10/04/15 0344  GLUCAP 89 84 106* 116* 123*    Iron Studies: No results for input(s): IRON, TIBC, TRANSFERRIN, FERRITIN in the last 72 hours. Studies/Results: Dg Chest Port 1 View  10/04/2015  CLINICAL DATA:  Hypoxia EXAM: PORTABLE CHEST 1 VIEW COMPARISON:  October 03, 2015 FINDINGS: Endotracheal tube tip is essentially at the carina, very little change from 1 day prior. Central catheter tip is in the superior vena cava. Nasogastric tube tip and side port are below the diaphragm. No pneumothorax. There is patchy bibasilar atelectasis. Lungs elsewhere clear. Heart size and pulmonary vascularity are normal. No adenopathy. IMPRESSION: Tube and catheter positions as described without pneumothorax. Note that the endotracheal tube tip is essentially at the carina. It may be prudent to consider withdrawing  endotracheal tube 2-3 cm. Patchy bibasilar atelectasis. No new opacity. No change in cardiac silhouette. These results will be called to the ordering clinician or representative by the Radiologist Assistant, and communication documented in the PACS or zVision Dashboard. Electronically Signed   By: Bretta BangWilliam  Woodruff III M.D.   On: 10/04/2015 07:16   Dg Chest Port 1 View  10/03/2015  CLINICAL DATA:  Acute respiratory failure EXAM: PORTABLE CHEST 1 VIEW COMPARISON:  Two days ago FINDINGS: Endotracheal tube tip is 5 mm above the carina. Right IJ dialysis catheter and left neck sheath are in stable position. An orogastric tube reaches the stomach at least. Improved lower lung aeration, especially on the right. Persistent dense retrocardiac opacity with possible air bronchograms. No edema, significant effusion, or pneumothorax. Normal heart size. IMPRESSION: 1. Low endotracheal tube with tip 5 mm above the carina. 2. Mild improvement basilar lung aeration with residual retrocardiac atelectasis or pneumonia. Electronically Signed   By: Marnee SpringJonathon  Watts M.D.   On: 10/03/2015 05:20   Medications: Infusions: . sodium chloride 10 mL/hr at 10/01/15 2000  . dexmedetomidine 0.1 mcg/kg/hr (10/04/15 0659)  . feeding supplement (VITAL AF 1.2 CAL) 1,000 mL (10/03/15 1202)  . fentaNYL infusion INTRAVENOUS 25 mcg/hr (10/04/15 0659)    Scheduled Medications: . antiseptic oral rinse  7 mL Mouth Rinse QID  . budesonide  0.25 mg Nebulization Q12H  . chlorhexidine gluconate  15 mL Mouth Rinse BID  . fentaNYL (SUBLIMAZE) injection  50 mcg Intravenous Once  . hydrocortisone sod succinate (SOLU-CORTEF) inj  50 mg Intravenous Q12H  . insulin aspart  0-9 Units Subcutaneous 6 times per day  . ipratropium-albuterol  3 mL Nebulization Q4H  . pantoprazole (PROTONIX) IV  40 mg Intravenous Q12H    have reviewed scheduled and prn medications.  Physical Exam: General: eyes open- no commands- jaundiced Heart: RRR Lungs: CBS  bilat Abdomen: distended Extremities: some pitting edema- black toes on right Dialysis Access: right IJ vascath placed 11/4    10/04/2015,7:27 AM  LOS: 7 days

## 2015-10-05 ENCOUNTER — Inpatient Hospital Stay (HOSPITAL_COMMUNITY): Payer: Commercial Managed Care - HMO

## 2015-10-05 DIAGNOSIS — J9601 Acute respiratory failure with hypoxia: Secondary | ICD-10-CM

## 2015-10-05 LAB — PROCALCITONIN: PROCALCITONIN: 4.01 ng/mL

## 2015-10-05 LAB — MAGNESIUM
MAGNESIUM: 2.8 mg/dL — AB (ref 1.7–2.4)
Magnesium: 1.8 mg/dL (ref 1.7–2.4)

## 2015-10-05 LAB — BLOOD GAS, ARTERIAL
ACID-BASE DEFICIT: 2.5 mmol/L — AB (ref 0.0–2.0)
Bicarbonate: 21.1 mEq/L (ref 20.0–24.0)
Drawn by: 398981
FIO2: 0.3
MECHVT: 400 mL
O2 SAT: 98.5 %
PEEP/CPAP: 5 cmH2O
PH ART: 7.431 (ref 7.350–7.450)
Patient temperature: 98.6
RATE: 12 resp/min
TCO2: 22.1 mmol/L (ref 0–100)
pCO2 arterial: 32.3 mmHg — ABNORMAL LOW (ref 35.0–45.0)
pO2, Arterial: 119 mmHg — ABNORMAL HIGH (ref 80.0–100.0)

## 2015-10-05 LAB — CBC
HEMATOCRIT: 25.8 % — AB (ref 36.0–46.0)
Hemoglobin: 8.4 g/dL — ABNORMAL LOW (ref 12.0–15.0)
MCH: 28.7 pg (ref 26.0–34.0)
MCHC: 32.6 g/dL (ref 30.0–36.0)
MCV: 88.1 fL (ref 78.0–100.0)
Platelets: 92 10*3/uL — ABNORMAL LOW (ref 150–400)
RBC: 2.93 MIL/uL — AB (ref 3.87–5.11)
RDW: 18.2 % — ABNORMAL HIGH (ref 11.5–15.5)
WBC: 17.9 10*3/uL — ABNORMAL HIGH (ref 4.0–10.5)

## 2015-10-05 LAB — COMPREHENSIVE METABOLIC PANEL
ALT: 74 U/L — ABNORMAL HIGH (ref 14–54)
ANION GAP: 11 (ref 5–15)
AST: 74 U/L — ABNORMAL HIGH (ref 15–41)
Albumin: 2.1 g/dL — ABNORMAL LOW (ref 3.5–5.0)
Alkaline Phosphatase: 182 U/L — ABNORMAL HIGH (ref 38–126)
BUN: 100 mg/dL — ABNORMAL HIGH (ref 6–20)
CHLORIDE: 107 mmol/L (ref 101–111)
CO2: 23 mmol/L (ref 22–32)
Calcium: 8 mg/dL — ABNORMAL LOW (ref 8.9–10.3)
Creatinine, Ser: 4.23 mg/dL — ABNORMAL HIGH (ref 0.44–1.00)
GFR calc Af Amer: 11 mL/min — ABNORMAL LOW (ref >60)
GFR calc non Af Amer: 9 mL/min — ABNORMAL LOW (ref >60)
Glucose, Bld: 149 mg/dL — ABNORMAL HIGH (ref 65–99)
Potassium: 5.5 mmol/L — ABNORMAL HIGH (ref 3.5–5.1)
SODIUM: 141 mmol/L (ref 135–145)
Total Bilirubin: 10.3 mg/dL — ABNORMAL HIGH (ref 0.3–1.2)
Total Protein: 5.3 g/dL — ABNORMAL LOW (ref 6.5–8.1)

## 2015-10-05 LAB — GLUCOSE, CAPILLARY
GLUCOSE-CAPILLARY: 92 mg/dL (ref 65–99)
GLUCOSE-CAPILLARY: 104 mg/dL — AB (ref 65–99)
GLUCOSE-CAPILLARY: 93 mg/dL (ref 65–99)
Glucose-Capillary: 130 mg/dL — ABNORMAL HIGH (ref 65–99)
Glucose-Capillary: 140 mg/dL — ABNORMAL HIGH (ref 65–99)
Glucose-Capillary: 144 mg/dL — ABNORMAL HIGH (ref 65–99)
Glucose-Capillary: 90 mg/dL (ref 65–99)

## 2015-10-05 LAB — PHOSPHORUS
PHOSPHORUS: 3.7 mg/dL (ref 2.5–4.6)
Phosphorus: 2.1 mg/dL — ABNORMAL LOW (ref 2.5–4.6)

## 2015-10-05 MED ORDER — ALBUMIN HUMAN 5 % IV SOLN
INTRAVENOUS | Status: AC
  Filled 2015-10-05: qty 250

## 2015-10-05 MED ORDER — ALBUMIN HUMAN 25 % IV SOLN
25.0000 g | Freq: Once | INTRAVENOUS | Status: AC
  Administered 2015-10-05: 25 g via INTRAVENOUS
  Filled 2015-10-05: qty 100

## 2015-10-05 MED ORDER — VITAL AF 1.2 CAL PO LIQD
1000.0000 mL | ORAL | Status: DC
  Filled 2015-10-05 (×3): qty 1000

## 2015-10-05 MED ORDER — VITAL AF 1.2 CAL PO LIQD
1000.0000 mL | ORAL | Status: DC
  Filled 2015-10-05: qty 1000

## 2015-10-05 MED ORDER — VANCOMYCIN HCL 10 G IV SOLR
1250.0000 mg | Freq: Once | INTRAVENOUS | Status: AC
  Administered 2015-10-05: 1250 mg via INTRAVENOUS
  Filled 2015-10-05: qty 1250

## 2015-10-05 MED ORDER — PIPERACILLIN-TAZOBACTAM IN DEX 2-0.25 GM/50ML IV SOLN
2.2500 g | Freq: Three times a day (TID) | INTRAVENOUS | Status: DC
  Administered 2015-10-05 – 2015-10-10 (×15): 2.25 g via INTRAVENOUS
  Filled 2015-10-05 (×17): qty 50

## 2015-10-05 NOTE — Progress Notes (Signed)
[facility]  Subjective  - Intubated plan to start CRRT today UO 20 in the last 12 hours.   Objective 101/58 109 97.8 F (36.6 C) (Oral) 20 100%  Intake/Output Summary (Last 24 hours) at 10/05/15 0734 Last data filed at 10/05/15 0600  Gross per 24 hour  Intake 1214.85 ml  Output     30 ml  Net 1184.85 ml   Abdomen distended but soft. Midline abdominal incisions and RLQ oblique incisions clean. Some superficial skin tears RLQ. Dry gangrenous changes to right 2nd, 3rd and 4th toes.  No change from yesterdays exam  Assessment/Planning: POD # 8 80 y.o. female is s/p: evacuation of retroperitoneal hematoma, repair of right external iliac artery.  Cr 4.23 UO 30 cc last 24 hr.  Need to do intermittent HD treatment today per nephrology report.   Clinton GallantCOLLINS, Khoa Opdahl [facility]EN 10/05/2015 7:34 AM --  Laboratory Lab Results:  Recent Labs  10/04/15 0540 10/05/15 0410  WBC 11.7* 17.9*  HGB 8.3* 8.4*  HCT 25.5* 25.8*  PLT 73* 92*   BMET  Recent Labs  10/04/15 0540 10/05/15 0410  NA 140 141  K 4.8 5.5*  CL 105 107  CO2 24 23  GLUCOSE 140* 149*  BUN 53* 100*  CREATININE 2.76* 4.23*  CALCIUM 7.7* 8.0*    COAG Lab Results  Component Value Date   INR 1.35 10/01/2015   INR 1.52* 09/30/2015   INR 1.67* 09/29/2015   No results found for: PTT

## 2015-10-05 NOTE — Progress Notes (Signed)
eLink Physician-Brief Progress Note Patient Name: [name] FlamingMartha H Buer DOB: 03/27/1935 MRN: [id number]   Date of Service  10/05/2015  HPI/Events of Note  Reviewed abdo film, tube coils back up  eICU Interventions  Has failed stomach feeds in past per RN Hold TF     Intervention Category Intermediate Interventions: Diagnostic test evaluation  [name],[name] 10/05/2015, 7:36 PM

## 2015-10-05 NOTE — Progress Notes (Signed)
Emesis x1, residual checked , MD notified. Will hold TF for now and readdress. Will continue to monitor patient appropriately.

## 2015-10-05 NOTE — Progress Notes (Signed)
Subjective:  Low grade temp- really no UOP- remains on vent Objective Vital signs in last 24 hours: Filed Vitals:   10/05/15 0600 10/05/15 0615 10/05/15 0630 10/05/15 0645  BP: 107/53     Pulse: 95 92 90 105  Temp:      TempSrc:      Resp: 17 16 17 17   Height:      Weight:      SpO2: 98% 98% 98% 99%   Weight change: -0.6 kg (-1 lb 5.2 oz)  Intake/Output Summary (Last 24 hours) at 10/05/15 0716 Last data filed at 10/05/15 0600  Gross per 24 hour  Intake 1214.85 ml  Output     30 ml  Net 1184.85 ml    Assessment/ Plan: Pt is Herman 80 y.o. yo female who was admitted on 10/26/2015 with hemorrhagic shock/PEA arrest after PAD intervention with resultant AKI  Assessment/Plan: 1. AKI- due to contrast nephropathy and ATN due to hemorrhagic shock and PEA arrest- had been CRRT dependent from 11/4-11/7.  Baseline creatinine in September was 1.07.  Right now labs are again worse with no UOP- need to do intermittent HD treatment today. volume seems OK with FIO2 of 30 and CVP in single digits.   AM still hopeful for eventual recovery.  Now CK is Herman little up- maybe an element of rhabdo  2. HTN/vol=- CVP down and FIo2 30% - Feeling like low BP has to do with sedation- it does get higher at times- no pressors 3. Anemia- after hemorrhagic shock- RPH- hgb dropping slowly 4. Elytes- hyperkalemia- should correct with HD today 5. VDRF- per CCM 6. Fever- per CCM- no abx, possibly due to hematoma- wbc up today, fever curve is decreasing   [name] Herman    Labs: Basic Metabolic Panel:  Recent Labs Lab 10/03/15 0350  10/04/15 0540 10/04/15 1648 10/05/15 0410  NA 136  --  140  --  141  K 4.7  --  4.8  --  5.5*  CL 103  --  105  --  107  CO2 25  --  24  --  23  GLUCOSE 96  --  140*  --  149*  BUN 18  --  53*  --  100*  CREATININE 0.95  --  2.76*  --  4.23*  CALCIUM 7.6*  --  7.7*  --  8.0*  PHOS 2.0*  < > 3.8 3.4 3.7  < > = values in this interval not displayed. Liver Function  Tests:  Recent Labs Lab 10/03/15 0350 10/04/15 0540 10/05/15 0410  AST 117* 127* 74*  ALT 103* 87* 74*  ALKPHOS 96 162* 182*  BILITOT 13.5* 16.9* 10.3*  PROT 5.7* 5.0* 5.3*  ALBUMIN 2.5* 2.1* 2.1*   No results for input(s): LIPASE, AMYLASE in the last 168 hours. No results for input(s): AMMONIA in the last 168 hours. CBC:  Recent Labs Lab 10/01/15 1542 10/02/15 0429 10/03/15 0350 10/04/15 0540 10/05/15 0410  WBC 17.0* 15.6* 13.0* 11.7* 17.9*  HGB 9.4* 9.3* 9.5* 8.3* 8.4*  HCT 28.3* 28.0* 28.6* 25.5* 25.8*  MCV 87.9 88.3 88.8 88.9 88.1  PLT 54* 52* 63* 73* 92*   Cardiac Enzymes:  Recent Labs Lab 09/28/15 0731 09/28/15 1805 10/01/15 0130  CKTOTAL  --  1781* 2174*  CKMB  --   --  36.5*  TROPONINI 0.61*  --   --    CBG:  Recent Labs Lab 10/04/15 1144 10/04/15 1558 10/04/15 1926 10/04/15 2341 10/05/15 0321  GLUCAP  140* 145* 131* 140* 144*    Iron Studies: No results for input(s): IRON, TIBC, TRANSFERRIN, FERRITIN in the last 72 hours. Studies/Results: Dg Chest Port 1 View  10/05/2015  CLINICAL DATA:  Acute respiratory failure.  Shortness of breath. EXAM: PORTABLE CHEST 1 VIEW COMPARISON:  10/04/2015. FINDINGS: Endotracheal tube, NG tube, right IJ line in stable position. Heart size stable. Patchy bibasilar infiltrates again noted. No pleural effusion or pneumothorax . No pneumothorax. IMPRESSION: 1. Lines and tubes in stable position . 2. Patchy bibasilar pulmonary infiltrates. Electronically Signed   By: Maisie Fus  Register   On: 10/05/2015 07:11   Dg Chest Port 1 View  10/04/2015  CLINICAL DATA:  Hypoxia EXAM: PORTABLE CHEST 1 VIEW COMPARISON:  October 03, 2015 FINDINGS: Endotracheal tube tip is essentially at the carina, very little change from 1 day prior. Central catheter tip is in the superior vena cava. Nasogastric tube tip and side port are below the diaphragm. No pneumothorax. There is patchy bibasilar atelectasis. Lungs elsewhere clear. Heart size and  pulmonary vascularity are normal. No adenopathy. IMPRESSION: Tube and catheter positions as described without pneumothorax. Note that the endotracheal tube tip is essentially at the carina. It may be prudent to consider withdrawing endotracheal tube 2-3 cm. Patchy bibasilar atelectasis. No new opacity. No change in cardiac silhouette. These results will be called to the ordering clinician or representative by the Radiologist Assistant, and communication documented in the PACS or zVision Dashboard. Electronically Signed   By: Bretta Bang III M.D.   On: 10/04/2015 07:16   Medications: Infusions: . sodium chloride 10 mL/hr at 10/01/15 2000  . dexmedetomidine 0.799 mcg/kg/hr (10/05/15 0528)  . feeding supplement (VITAL AF 1.2 CAL) 1,000 mL (10/03/15 1202)  . fentaNYL infusion INTRAVENOUS 25 mcg/hr (10/04/15 2000)    Scheduled Medications: . antiseptic oral rinse  7 mL Mouth Rinse QID  . budesonide  0.25 mg Nebulization Q12H  . chlorhexidine gluconate  15 mL Mouth Rinse BID  . fentaNYL (SUBLIMAZE) injection  50 mcg Intravenous Once  . hydrocortisone sod succinate (SOLU-CORTEF) inj  50 mg Intravenous Q12H  . insulin aspart  0-9 Units Subcutaneous 6 times per day  . ipratropium-albuterol  3 mL Nebulization Q4H  . pantoprazole (PROTONIX) IV  40 mg Intravenous Q12H    have reviewed scheduled and prn medications.  Physical Exam: General: eyes open- no commands- jaundiced- on vent Heart: RRR Lungs: CBS bilat Abdomen: distended Extremities: some pitting edema- black toes on right Dialysis Access: right IJ vascath placed 11/4    10/05/2015,7:16 AM  LOS: 8 days

## 2015-10-05 NOTE — Procedures (Signed)
Patient was seen on dialysis and the procedure was supervised.  BFR 400  Via vascath BP is  Labile- some low BPs trying albumin and fluid bolusus.    [name] Herman A 10/05/2015

## 2015-10-05 NOTE — Progress Notes (Signed)
Patient ID: [name], female   DOB: 01/27/1935, 80 y.o.   MRN: [id number] [name] Herman is still on ventilator support and apparently not tolerating weaning protocol. She is still receiving HD due to low UOP.  AF BP 160/73 this morning with some mild hypotension through the night. Pulse 110 Pulse ox 100% on FiO2 30% Right foot is about the same: 2-4 toes with dry gangrene.  Hb 8.4 WBC 17.9 PLT 92k BUN/Cr 100/4.3  She is stable but still has not recovered renal or respiratory function WBC is trending up but AF and there is no sign of obvious toe infection.

## 2015-10-05 NOTE — Progress Notes (Signed)
[facility] ANTIBIOTIC CONSULT NOTE - INITIAL  Pharmacy Consult for vanc/zosyn Indication: sepsis  Allergies  Allergen Reactions  . Meloxicam     GI upset  . Meperidine Hcl     GI upset    Patient Measurements: Height: 5' (152.4 cm) Weight: 126 lb 12.2 oz (57.5 kg) IBW/kg (Calculated) : 45.5  Vital Signs: Temp: 98.1 F (36.7 C) (11/09 0831) Temp Source: Oral (11/09 0831) BP: 89/41 mmHg (11/09 1030) Pulse Rate: 73 (11/09 1030) Intake/Output from previous day: 11/08 0701 - 11/09 0700 In: 1264 [I.V.:544; NG/GT:720] Out: 30 [Urine:30] Intake/Output from this shift: Total I/O In: 258.5 [I.V.:78.5; NG/GT:80; IV Piggyback:100] Out: -   Labs:  Recent Labs  10/03/15 0350 10/04/15 0540 10/05/15 0410  WBC 13.0* 11.7* 17.9*  HGB 9.5* 8.3* 8.4*  PLT 63* 73* 92*  CREATININE 0.95 2.76* 4.23*   Estimated Creatinine Clearance: 8.7 mL/min (by C-G formula based on Cr of 4.23). No results for input(s): VANCOTROUGH, VANCOPEAK, VANCORANDOM, GENTTROUGH, GENTPEAK, GENTRANDOM, TOBRATROUGH, TOBRAPEAK, TOBRARND, AMIKACINPEAK, AMIKACINTROU, AMIKACIN in the last 72 hours.   Microbiology: Recent Results (from the past 720 hour(s))  MRSA PCR Screening     Status: None   Collection Time: 10/21/2015  5:16 PM  Result Value Ref Range Status   MRSA by PCR NEGATIVE NEGATIVE Final    Comment:        The GeneXpert MRSA Assay (FDA approved for NASAL specimens only), is one component of a comprehensive MRSA colonization surveillance program. It is not intended to diagnose MRSA infection nor to guide or monitor treatment for MRSA infections.     Medical History: Past Medical History  Diagnosis Date  . ANEMIA 09/25/2010  . DEPRESSION 12/02/2009  . ESSENTIAL HYPERTENSION 02/10/2010  . ASTHMA 12/02/2009  . COPD 10/03/2010  . PEPTIC ULCER DISEASE 10/23/2010  . NEPHROLITHIASIS 12/02/2009  . OSTEOARTHRITIS 01/27/2010  . Pyloric stenosis   . Peptic ulcer, unspecified site, unspecified as acute or chronic,  without mention of hemorrhage, perforation, or obstruction   . Gastritis     Medications:  Scheduled:  . antiseptic oral rinse  7 mL Mouth Rinse QID  . budesonide  0.25 mg Nebulization Q12H  . chlorhexidine gluconate  15 mL Mouth Rinse BID  . fentaNYL (SUBLIMAZE) injection  50 mcg Intravenous Once  . hydrocortisone sod succinate (SOLU-CORTEF) inj  50 mg Intravenous Q12H  . insulin aspart  0-9 Units Subcutaneous 6 times per day  . ipratropium-albuterol  3 mL Nebulization Q4H  . pantoprazole (PROTONIX) IV  40 mg Intravenous Q12H  . piperacillin-tazobactam (ZOSYN)  IV  2.25 g Intravenous Q8H  . vancomycin  1,250 mg Intravenous Once   Assessment: 80 year old female who presented to [facility] 10/16/2015 for iliac artery stent placement. Procedure was done, no stent needed. Post-operatively she complained of nausea and had hypotension. She became unresponsive and code blue was called 11/1.  Abx started due to WBC trending up 11.7>17.6,  PCT trending up 2.80>>3.92> 4.01, and Fever (101) overnight.  LA 1.6, down from 16.5 on 11/1.  Blood cultures being drawn prior to abx administration.  Pt received CRRT 11/4 - 11/7. SCr rapidly increased from 2.76 to 4.73 in 24h. Undergoing HD this AM for 3.5 hrs at BFR of 400 ml/hr.    Goal of Therapy:  Vancomycin trough level 15-20 mcg/ml  Plan:  - Vanc load 1250 mg x1 - F/u HD schedule and vanc level prior to HD to determine dosing - Zosyn 2.25 g IV q8h  over 30 min - Monitor clinical progression, vanc levels, HD schedule, and cultures  Greggory Stallionristy Reyes, PharmD Clinical Pharmacy Resident Pager # [phone] 10/05/2015 10:44 AM

## 2015-10-05 NOTE — Progress Notes (Addendum)
Nutrition Follow-up  DOCUMENTATION CODES:   Not applicable  INTERVENTION:    CORTRAK team to place feeding tube in a post-pyloric position before resuming TF.  Once tube is post-pyloric, resume Vital AF 1.2 at 10 ml/h.  RD to evaluate tolerance of TF and advance rate as able on Thursday.  NUTRITION DIAGNOSIS:   Inadequate oral intake related to inability to eat as evidenced by NPO status.  Ongoing   GOAL:   Patient will meet greater than or equal to 90% of their needs  Unmet  MONITOR:   TF tolerance, Vent status, Labs, Weight trends, I & O's  REASON FOR ASSESSMENT:   Consult Enteral/tube feeding initiation and management  ASSESSMENT:   80 year old [facility] for iliac artery stent placement. Procedure was done, no stent needed. Post-operatively she complained of nausea and had hypotension. Distended abdomen. CT with with retroperitoneal blood, not actively bleeding per Dr. Bonnielee HaffHoss.  Labs reviewed: potassium and magnesium elevated. Patient tolerated HD for a few hours today per RN.  Discussed patient in ICU rounds and with RN today. Per RN abdominal exam, abdomen distended. Emesis x 1 this AM. Residual checked after emesis, 500 ml. TF currently on hold. Patient has not had adequate nutrition since admission, due to TF being held or kept at a low rate due to intolerance. Spoke with CCM physician who agreed to try post-pyloric feeding tube placement [facility].  Patient is currently intubated on ventilator support MV: 8.5 L/min Temp (24hrs), Avg:98.5 F (36.9 C), Min:97.8 F (36.6 C), Max:99.4 F (37.4 C)   Diet Order:   NPO  Skin:  Reviewed, no issues  Last BM:  11/2  Height:   Ht Readings from Last 1 Encounters:  05/16/15 5' (1.524 m)    Weight:   Wt Readings from Last 1 Encounters:  10/05/15 126 lb 12.2 oz (57.5 kg)    Ideal Body Weight:  45.5 kg  BMI:  Body mass index is 24.76  kg/(m^2).  Estimated Nutritional Needs:   Kcal:  1389  Protein:  80-90 gm  Fluid:  1.5 L  EDUCATION NEEDS:   No education needs identified at this time   Joaquin CourtsKimberly Harris, RD, LDN, CNSC Pager [phone] After Hours Pager [phone]

## 2015-10-05 NOTE — Progress Notes (Signed)
PULMONARY / CRITICAL CARE MEDICINE   Name: [name] Herman MRN: [id number] DOB: [birth year]    ADMISSION DATE:  2015-10-24 CONSULTATION DATE:  Oct 24, 2015  REFERRING MD :  Dr. Bonnielee Haff, IR  CHIEF COMPLAINT:  RP bleeding after IR procedure  INITIAL PRESENTATION: 80 year old female who presented to [facility] Interventional [facility]iology Oct 24, 2015 for iliac artery stent placement. Procedure was done, no stent needed. Post-operatively she complained of nausea and had hypotension. Distended abdomen. CT with with retroperitoneal blood, not actively bleeding per Dr. Bonnielee Haff. To ICU for PCCM management.   STUDIES:  CT (A) abd/pelvis 2023-10-24) >>> Large right retroperitoneal hemorrhage extending from the right inguinal region after angiogram. There is no active extravasation or bleeding. There is no pseudoaneurysm. Lower extremity arterial duplex 10/24/2023) No evidence of psuedoaneurysm in the right groin. A hematoma is noted measuring 3.55cm. CXR (11/3): No significant change in left lower lobe atelectasis or consolidation and mild bilateral perihilar atelectasis CXR (11/4): Endotracheal tube tip remains at the carina. Proximal repositioning of approximately 2 to 3 cm suggested. Remaining lines and tubes in stable position. Persistent bibasilar atelectasis. Mild infiltrate left lung base cannot be excluded. CXR (11/7): 1. Low endotracheal tube with tip 5 mm above the carina. 2. Mild improvement basilar lung aeration with residual retrocardiac atelectasis or pneumonia. CXR (11/8): Tube and catheter positions as described without pneumothorax. Note that the endotracheal tube tip is essentially at the carina. It may be prudent to consider withdrawing endotracheal tube 2-3 cm CXR (11/9): lines stable. Patchy bibasilar pulmonary infiltrates.   SIGNIFICANT EVENTS: 2023-10-24 to IR for iliac stent, stent not needed, post op RP blood. Shock. To ICU October 24, 2023 coded pea 10 min , OR for evacuation hematoma and iliac repair  11/2- GI bleeding,  renal called 11/3- less bleeding from OGT, no pressors 11/4- HD transitioned to CVVH 11/7- CRRT stopped  SUBJECTIVE:  Patient not tolerating weaning trial well.  To resume CVVHD today  VITAL SIGNS: Temp:  [97.8 F (36.6 C)-99.4 F (37.4 C)] 97.8 F (36.6 C) (11/09 0319) Pulse Rate:  [78-135] 105 (11/09 0645) Resp:  [14-29] 17 (11/09 0645) BP: (91-187)/(35-111) 107/53 mmHg (11/09 0600) SpO2:  [98 %-100 %] 99 % (11/09 0645) Arterial Line BP: (87-200)/(41-85) 174/72 mmHg (11/09 0545) FiO2 (%):  [30 %] 30 % (11/09 0343) Weight:  [126 lb 12.2 oz (57.5 kg)] 126 lb 12.2 oz (57.5 kg) (11/09 0414) HEMODYNAMICS: CVP:  [6 mmHg-8 mmHg] 6 mmHg VENTILATOR SETTINGS: Vent Mode:  [-] PRVC FiO2 (%):  [30 %] 30 % Set Rate:  [12 bmp] 12 bmp Vt Set:  [400 mL] 400 mL PEEP:  [5 cmH20] 5 cmH20 Pressure Support:  [5 cmH20] 5 cmH20 Plateau Pressure:  [16 cmH20-19 cmH20] 16 cmH20 INTAKE / OUTPUT:  Intake/Output Summary (Last 24 hours) at 10/05/15 0711 Last data filed at 10/05/15 0600  Gross per 24 hour  Intake 1214.85 ml  Output     30 ml  Net 1184.85 ml    PHYSICAL EXAMINATION:  General:  Weaning trial, eyes closed Neuro:  Patient fidgeting, PERRLA, not tolerating wean HEENT: No thryomegaly, JVD Cardiovascular:  S1, S2, RRR Lungs: Clear to auscultation bilaterally, intubated Abdomen:  Soft, + BS. Abdominal bandages in place Musculoskeletal:  Lower extremities without edema, see skin, LE pulses difficult to palpate Skin:  Necrotic appearing R toes, extent of necrosis seems to be increasing in all 3 toes compared to previous exam.  Photos (10/03/05)      LABS:  CBC  Recent Labs  Lab 10/03/15 0350 10/04/15 0540 10/05/15 0410  WBC 13.0* 11.7* 17.9*  HGB 9.5* 8.3* 8.4*  HCT 28.6* 25.5* 25.8*  PLT 63* 73* 92*   Coag's  Recent Labs Lab 09/29/15 0938 09/30/15 0619 10/01/15 0400  INR 1.67* 1.52* 1.35   BMET  Recent Labs Lab 10/03/15 0350 10/04/15 0540 10/05/15 0410  NA  136 140 141  K 4.7 4.8 5.5*  CL 103 105 107  CO2 BUN 18 53* 100*  CREATININE 0.95 2.76* 4.23*  GLUCOSE 96 140* 149*   Electrolytes  Recent Labs Lab 10/03/15 0350  10/04/15 0315 10/04/15 0540 10/04/15 1646 10/04/15 1648 10/05/15 0410  CALCIUM 7.6*  --   --  7.7*  --   --  8.0*  MG 2.5*  < > 2.6*  --  2.6*  --  2.8*  PHOS 2.0*  < >  --  3.8  --  3.4 3.7  < > = values in this interval not displayed. Sepsis Markers  Recent Labs Lab 10/01/15 0130 10/03/15 1053 10/04/15 0540 10/05/15 0410  LATICACIDVEN 1.6  --   --   --   PROCALCITON  --  2.80 3.92 4.01   ABG  Recent Labs Lab 10/01/15 0325 10/03/15 1023 10/05/15 0357  PHART 7.337* 7.446 7.431  PCO2ART 44.1 39.7 32.3*  PO2ART 92.1 97.0 119*   Liver Enzymes  Recent Labs Lab 10/03/15 0350 10/04/15 0540 10/05/15 0410  AST 117* 127* 74*  ALT 103* 87* 74*  ALKPHOS 96 162* 182*  BILITOT 13.5* 16.9* 10.3*  ALBUMIN 2.5* 2.1* 2.1*   Cardiac Enzymes  Recent Labs Lab 09/28/15 0731  TROPONINI 0.61*   Glucose  Recent Labs Lab 10/04/15 0830 10/04/15 1144 10/04/15 1558 10/04/15 1926 10/04/15 2341 10/05/15 0321  GLUCAP 152* 140* 145* 131* 140* 144*    Imaging No results found.   ASSESSMENT / PLAN:  PULMONARY A: COPD without acute exacerbation Acute resp failure, hypoxia High risk TRALI High risk uncompensated acidosis  pulm edema concerns  P:   Did not tolerate wean this am. Fentanyl  CVVH dc'd 11/7, to resume this am  CARDIOVASCULAR CVL LIJ sheath 11/1 >>>  ALINE left fem 11/1>>> A:  Hemorrhagic shock resolved Blue toe syndrome PAD H/o HTN Trop leak, s/p cpr P:  Tolerating being off pressors.  Transfusion threshold hgb 7.0. Bleeding and DIC has improved.  RENAL A:   CKD Acute renal failure, ATN, post arrest , shock At risk abdo compartment, bladder pressure high but no elevated vent pressures or CVP No plan for repeat OR.  Hypernatremia, improved Hyperkalemia,  5.5 Hypophos, improved Cr continue to worsen 4.23 P:   Daily BMET Replete electrolytes as needed Resuming CVVHD this am  GASTROINTESTINAL A:   H/o PUD Active GI bleed, likely all related to coags, fibrinogen, plat etc Improved bleeding Abdominal pressure improved to 15 on 11/8 P:   TF resumed 11/7, high residuals so remains on 25cc PPI bid  HEMATOLOGIC Total: PRBC 16 FFP 3 Cryo 11 A:   Acute retroperitoneal hemorrhage s/p IR arteriogram  Consumptive / dilutional coagulapthy DIC improved with cryo Hgb stable 8.4, Platelets 92, WBC 17.9 P:  CBC to q24 Follow CBC, platelets, coags  INFECTIOUS A:   Afebrile overnight.  Resolved independently.  WBC worsening.  PCT rising from 2.8>3.92>4.01.  CXR with Patchy bibasilar pulmonary infiltrates.  P:   Could consider initiating abx  ENDOCRINE A:   Chronic prednisone for polymyalgia rheumatica GI bleed P:   Hydrocortisone  to 50 mg q12 Follow glucose on Bmet SSI  NEUROLOGIC A:   Acute metabolic encephalopathy up and down R/o anoxia, post arrest P:   RASS goal: 0 Monitor closely Fentanyl resumed yesterday afternoon Avoid benzo  FAMILY  - Updates: family updated in full 11/6 - Inter-disciplinary family meet or Palliative Care meeting due by:  11/7  Mena Lienau M. Nadine CountsGottschalk, DO PGY-2, [facility] 10/05/2015, 7:11 AM

## 2015-10-05 NOTE — Progress Notes (Signed)
**  Critical Care Interval Note**  Patient s/p CORTRAK placement for feeds.  Tolerated HD ok today.  BP was somewhat labile during dialysis.  Started on Vanc and Zosyn for increasing WBC and PCT. Currently, afebrile.  BP 160/68, HR 104.  Ashly M. Nadine CountsGottschalk, DO PGY-2, [facility]

## 2015-10-06 ENCOUNTER — Inpatient Hospital Stay (HOSPITAL_COMMUNITY): Payer: Commercial Managed Care - HMO

## 2015-10-06 LAB — BLOOD GAS, ARTERIAL
ACID-BASE EXCESS: 0.8 mmol/L (ref 0.0–2.0)
Bicarbonate: 24.1 mEq/L — ABNORMAL HIGH (ref 20.0–24.0)
DRAWN BY: 398981
FIO2: 0.3
MECHVT: 400 mL
O2 SAT: 98.5 %
PATIENT TEMPERATURE: 98.6
PCO2 ART: 33.7 mmHg — AB (ref 35.0–45.0)
PEEP: 5 cmH2O
PH ART: 7.468 — AB (ref 7.350–7.450)
RATE: 12 resp/min
TCO2: 25.2 mmol/L (ref 0–100)
pO2, Arterial: 112 mmHg — ABNORMAL HIGH (ref 80.0–100.0)

## 2015-10-06 LAB — MAGNESIUM
MAGNESIUM: 2 mg/dL (ref 1.7–2.4)
Magnesium: 2.1 mg/dL (ref 1.7–2.4)

## 2015-10-06 LAB — CBC
HCT: 21.3 % — ABNORMAL LOW (ref 36.0–46.0)
Hemoglobin: 7.3 g/dL — ABNORMAL LOW (ref 12.0–15.0)
MCH: 30 pg (ref 26.0–34.0)
MCHC: 34.3 g/dL (ref 30.0–36.0)
MCV: 87.7 fL (ref 78.0–100.0)
PLATELETS: 100 10*3/uL — AB (ref 150–400)
RBC: 2.43 MIL/uL — AB (ref 3.87–5.11)
RDW: 18.1 % — AB (ref 11.5–15.5)
WBC: 15.5 10*3/uL — AB (ref 4.0–10.5)

## 2015-10-06 LAB — GLUCOSE, CAPILLARY
GLUCOSE-CAPILLARY: 98 mg/dL (ref 65–99)
GLUCOSE-CAPILLARY: 96 mg/dL (ref 65–99)
GLUCOSE-CAPILLARY: 110 mg/dL — AB (ref 65–99)
Glucose-Capillary: 98 mg/dL (ref 65–99)
Glucose-Capillary: 108 mg/dL — ABNORMAL HIGH (ref 65–99)

## 2015-10-06 LAB — RENAL FUNCTION PANEL
ALBUMIN: 2.2 g/dL — AB (ref 3.5–5.0)
Anion gap: 10 (ref 5–15)
BUN: 55 mg/dL — AB (ref 6–20)
CHLORIDE: 102 mmol/L (ref 101–111)
CO2: 25 mmol/L (ref 22–32)
Calcium: 7.7 mg/dL — ABNORMAL LOW (ref 8.9–10.3)
Creatinine, Ser: 2.67 mg/dL — ABNORMAL HIGH (ref 0.44–1.00)
GFR calc Af Amer: 19 mL/min — ABNORMAL LOW (ref >60)
GFR calc non Af Amer: 16 mL/min — ABNORMAL LOW (ref >60)
GLUCOSE: 100 mg/dL — AB (ref 65–99)
POTASSIUM: 4 mmol/L (ref 3.5–5.1)
Phosphorus: 3.3 mg/dL (ref 2.5–4.6)
Sodium: 137 mmol/L (ref 135–145)

## 2015-10-06 LAB — PHOSPHORUS: PHOSPHORUS: 4.5 mg/dL (ref 2.5–4.6)

## 2015-10-06 LAB — PROCALCITONIN: Procalcitonin: 3.4 ng/mL

## 2015-10-06 LAB — PROTIME-INR
INR: 1.56 — ABNORMAL HIGH (ref 0.00–1.49)
Prothrombin Time: 18.7 seconds — ABNORMAL HIGH (ref 11.6–15.2)

## 2015-10-06 MED ORDER — LIDOCAINE VISCOUS 2 % MT SOLN
OROMUCOSAL | Status: AC
  Filled 2015-10-06: qty 15

## 2015-10-06 MED ORDER — HALOPERIDOL LACTATE 5 MG/ML IJ SOLN
5.0000 mg | Freq: Two times a day (BID) | INTRAMUSCULAR | Status: DC
  Administered 2015-10-06 – 2015-10-11 (×11): 5 mg via INTRAVENOUS
  Filled 2015-10-06 (×15): qty 1

## 2015-10-06 MED ORDER — VITAL AF 1.2 CAL PO LIQD
1000.0000 mL | ORAL | Status: DC
  Administered 2015-10-06: 1000 mL
  Filled 2015-10-06 (×3): qty 1000

## 2015-10-06 MED ORDER — IOHEXOL 300 MG/ML  SOLN
50.0000 mL | Freq: Once | INTRAMUSCULAR | Status: DC | PRN
  Administered 2015-10-06: 50 mL
  Filled 2015-10-06: qty 50

## 2015-10-06 MED ORDER — DARBEPOETIN ALFA 100 MCG/0.5ML IJ SOSY
100.0000 ug | PREFILLED_SYRINGE | INTRAMUSCULAR | Status: DC
  Administered 2015-10-06: 100 ug via SUBCUTANEOUS
  Filled 2015-10-06: qty 0.5

## 2015-10-06 MED ORDER — VITAL AF 1.2 CAL PO LIQD
1000.0000 mL | ORAL | Status: DC
  Filled 2015-10-06 (×2): qty 1000

## 2015-10-06 MED ORDER — HALOPERIDOL LACTATE 5 MG/ML IJ SOLN
5.0000 mg | Freq: Four times a day (QID) | INTRAMUSCULAR | Status: DC | PRN
  Administered 2015-10-06: 5 mg via INTRAVENOUS
  Filled 2015-10-06: qty 1

## 2015-10-06 NOTE — Progress Notes (Signed)
PULMONARY / CRITICAL CARE MEDICINE   Name: [name] Herman MRN: [id number] DOB: 09-19-1935    ADMISSION DATE:  2015-10-21 CONSULTATION DATE:  2015/10/21  REFERRING MD :  Dr. Bonnielee Haff, IR  CHIEF COMPLAINT:  RP bleeding after IR procedure  INITIAL PRESENTATION: 80 year old female who presented to [facility] Inter[facility]iology October 21, 2015 for iliac artery stent placement. Procedure was done, no stent needed. Post-operatively she complained of nausea and had hypotension. Distended abdomen. CT with with retroperitoneal blood, not actively bleeding per Dr. Bonnielee Haff. To ICU for PCCM management.   STUDIES:  CT (A) abd/pelvis 21-Oct-2023) >>> Large right retroperitoneal hemorrhage extending from the right inguinal region after angiogram. There is no active extravasation or bleeding. There is no pseudoaneurysm. Lower extremity arterial duplex 2023-10-21) No evidence of psuedoaneurysm in the right groin. A hematoma is noted measuring 3.55cm. CXR (11/3): No significant change in left lower lobe atelectasis or consolidation and mild bilateral perihilar atelectasis CXR (11/4): Endotracheal tube tip remains at the carina. Proximal repositioning of approximately 2 to 3 cm suggested. Remaining lines and tubes in stable position. Persistent bibasilar atelectasis. Mild infiltrate left lung base cannot be excluded. CXR (11/7): 1. Low endotracheal tube with tip 5 mm above the carina. 2. Mild improvement basilar lung aeration with residual retrocardiac atelectasis or pneumonia. CXR (11/8): Tube and catheter positions as described without pneumothorax. Note that the endotracheal tube tip is essentially at the carina. It may be prudent to consider withdrawing endotracheal tube 2-3 cm CXR (11/9): lines stable. Patchy bibasilar pulmonary infiltrates. KUB (11/9): coiled feeding tube  SIGNIFICANT EVENTS: 10/21/2023 to IR for iliac stent, stent not needed, post op RP blood. Shock. To ICU 10/21/23 coded pea 10 min , OR for evacuation hematoma and iliac  repair  11/2- GI bleeding, renal called 11/3- less bleeding from OGT, no pressors 11/4- HD transitioned to CVVH 11/7- CRRT stopped 11/9- intermittent HD started  SUBJECTIVE:  Patient restless. Tube feeding stopped 11/10  VITAL SIGNS: Temp:  [97.9 F (36.6 C)-98.6 F (37 C)] 98.6 F (37 C) (11/10 0338) Pulse Rate:  [73-127] 90 (11/10 0709) Resp:  [15-29] 25 (11/10 0709) BP: (76-168)/(41-110) 148/79 mmHg (11/10 0700) SpO2:  [98 %-100 %] 100 % (11/10 0714) Arterial Line BP: (73-225)/(35-207) 146/65 mmHg (11/10 0700) FiO2 (%):  [30 %] 30 % (11/10 0709) Weight:  [128 lb 12 oz (58.4 kg)] 128 lb 12 oz (58.4 kg) (11/10 0500) HEMODYNAMICS: CVP:  [7 mmHg-9 mmHg] 8 mmHg VENTILATOR SETTINGS: Vent Mode:  [-] PRVC FiO2 (%):  [30 %] 30 % Set Rate:  [12 bmp] 12 bmp Vt Set:  [400 mL] 400 mL PEEP:  [5 cmH20] 5 cmH20 Plateau Pressure:  [15 cmH20-23 cmH20] 19 cmH20 INTAKE / OUTPUT:  Intake/Output Summary (Last 24 hours) at 10/06/15 0727 Last data filed at 10/06/15 0700  Gross per 24 hour  Intake 1257.86 ml  Output   -295 ml  Net 1552.86 ml    PHYSICAL EXAMINATION:  General:  Restless, intubated Neuro:  Patient fidgeting, PERRLA HEENT: No thryomegaly, JVD Cardiovascular:  S1, S2, RRR Lungs: Clear to auscultation bilaterally, intubated Abdomen:  Soft, + BS. Abdominal bandages in place Musculoskeletal:  Lower extremities without edema, LE pulses difficult to palpate Skin:  Necrotic appearing R toes, see photo below  Photos (10/03/05)      LABS:  CBC  Recent Labs Lab 10/04/15 0540 10/05/15 0410 10/06/15 0515  WBC 11.7* 17.9* 15.5*  HGB 8.3* 8.4* 7.3*  HCT 25.5* 25.8* 21.3*  PLT 73*  92* 100*   Coag's  Recent Labs Lab 09/29/15 0938 09/30/15 0619 10/01/15 0400  INR 1.67* 1.52* 1.35   BMET  Recent Labs Lab 10/04/15 0540 10/05/15 0410 10/06/15 0515  NA 140 141 137  K 4.8 5.5* 4.0  CL 105 107 102  CO2 BUN 53* 100* 55*  CREATININE 2.76* 4.23* 2.67*   GLUCOSE 140* 149* 100*   Electrolytes  Recent Labs Lab 10/04/15 0540  10/05/15 0410 10/05/15 1402 10/05/15 1627 10/06/15 0515  CALCIUM 7.7*  --  8.0*  --   --  7.7*  MG  --   < > 2.8* 1.8  --  2.0  PHOS 3.8  < > 3.7  --  2.1* 3.3  < > = values in this interval not displayed. Sepsis Markers  Recent Labs Lab 10/01/15 0130 10/03/15 1053 10/04/15 0540 10/05/15 0410  LATICACIDVEN 1.6  --   --   --   PROCALCITON  --  2.80 3.92 4.01   ABG  Recent Labs Lab 10/03/15 1023 10/05/15 0357 10/06/15 0330  PHART 7.446 7.431 7.468*  PCO2ART 39.7 32.3* 33.7*  PO2ART 97.0 119* 112*   Liver Enzymes  Recent Labs Lab 10/03/15 0350 10/04/15 0540 10/05/15 0410 10/06/15 0515  AST 117* 127* 74*  --   ALT 103* 87* 74*  --   ALKPHOS 96 162* 182*  --   BILITOT 13.5* 16.9* 10.3*  --   ALBUMIN 2.5* 2.1* 2.1* 2.2*   Cardiac Enzymes No results for input(s): TROPONINI, PROBNP in the last 168 hours. Glucose  Recent Labs Lab 10/05/15 0815 10/05/15 1153 10/05/15 1524 10/05/15 1934 10/05/15 2338 10/06/15 0341  GLUCAP 130* 92 104* 93 90 98    Imaging Dg Abd Portable 1v  10/05/2015  CLINICAL DATA:  Feeding tube placement EXAM: PORTABLE ABDOMEN - 1 VIEW COMPARISON:  None. FINDINGS: The distal feeding tube is coiled in the stomach with the tip of the feeding tube in the proximal body of the stomach. The bowel gas pattern is normal. No obstruction or free air is appreciable on this supine examination. There are multiple skin staples. There is mild atelectasis in the left lung base. IMPRESSION: Feeding tube tip in stomach.  Bowel gas pattern unremarkable. Electronically Signed   By: Bretta Bang III M.D.   On: 10/05/2015 17:57     ASSESSMENT / PLAN:  PULMONARY A: COPD without acute exacerbation Acute resp failure, hypoxia High risk TRALI High risk uncompensated acidosis  pulm edema concerns  ABG: pH 7.468, CO2 33.7 P:   Fentanyl discontinued 11/9 Precedex Intermittent  HD resumed 11/09 Consider decreasing vent rate Repeat CXR  CARDIOVASCULAR CVL LIJ sheath 11/1 >>>  ALINE left fem 11/1>>> A:  Hemorrhagic shock resolved Blue toe syndrome PAD H/o HTN Trop leak, s/p cpr P:  Tolerating being off pressors.  Transfusion threshold hgb 7.0. DIC improved  RENAL A:   CKD Acute renal failure, ATN, post arrest , shock At risk abdo compartment, bladder pressure high but no elevated vent pressures or CVP No plan for repeat OR.  Hypernatremia, improved Hyperkalemia, improved Hypophos, improved Cr 2.67 P:   Daily BMET Replete electrolytes as needed Intermittent HD per renal  GASTROINTESTINAL A:   H/o PUD Active GI bleed, likely all related to coags, fibrinogen, plat etc Improved bleeding Abdominal pressure improved to 15 on 11/8 P:   TF resumed 11/7, high residuals. Transitioned to CORTRAK feeds but unable to tolerate so TF dc'd 11/9 PPI bid  HEMATOLOGIC Total:  PRBC 16 FFP 3 Cryo 11 A:   Acute retroperitoneal hemorrhage s/p IR arteriogram  Consumptive / dilutional coagulapthy DIC improved with cryo Hgb with a 1 point drop to 7.3 P:  CBC to q24 Follow CBC, platelets, coags  INFECTIOUS A:   Afebrile overnight.  Resolved independently.  WBC worsening.  PCT rising 4.01.  CXR with Patchy bibasilar pulmonary infiltrates.  P:   Blood cx 11/9>>  Vanc 11/9>> Zosyn 11/9>>  ENDOCRINE A:   Chronic prednisone for polymyalgia rheumatica GI bleed P:   Hydrocortisone to 50 mg q12 Follow glucose on Bmet SSI  NEUROLOGIC A:   Acute metabolic encephalopathy up and down R/o anoxia, post arrest P:   RASS goal: 0 Monitor closely Fentanyl discontinued 11/9 Avoid benzo  FAMILY  - Updates: family updated in full 11/6 - Inter-disciplinary family meet or Palliative Care meeting due by:  11/7  Ashly M. Nadine CountsGottschalk, DO PGY-2, [facility] 10/06/2015, 7:27 AM

## 2015-10-06 NOTE — Clinical Documentation Improvement (Signed)
Critical Care  Possible Conditions?   Acute Blood Loss Anemia, including the suspected or known cause or associated condition(s)  Acute on chronic blood loss anemia, including the suspected or known cause or associated condition(s)  Chronic blood loss anemia, including the suspected or known cause or associated condition(s)  Precipitous drop in Hematocrit, including the suspected or known cause or associated condition(s)  Other  Clinically Undetermined  Document any associated diagnoses/conditions.  Please update your documentation within the medical record to reflect your response to this query. Thank you.  Supporting Information: Component     Latest Ref Rng 09/29/2015 09/29/2015 09/30/2015 09/30/2015 10/01/2015         5:35 PM 11:00 PM  6:19 AM  3:35 PM  1:30 AM  Hemoglobin     12.0 - 15.0 g/dL 16.111.7 (L) 9.6 (L) 8.7 (L) 9.6 (L) 10.3 (L)  HCT     36.0 - 46.0 % 33.4 (L) 26.7 (L) 24.8 (L) 28.5 (L) 30.3 (L)   Component     Latest Ref Rng 10/01/2015 10/01/2015 10/02/2015 10/03/2015 10/04/2015         4:00 AM  3:42 PM     Hemoglobin     12.0 - 15.0 g/dL 09.610.0 (L) 9.4 (L) 9.3 (L) 9.5 (L) 8.3 (L)  HCT     36.0 - 46.0 % 29.8 (L) 28.3 (L) 28.0 (L) 28.6 (L) 25.5 (L)   Component     Latest Ref Rng 10/05/2015 10/06/2015           Hemoglobin     12.0 - 15.0 g/dL 8.4 (L) 7.3 (L)  HCT     36.0 - 46.0 % 25.8 (L) 21.3 (L)    Please exercise your independent, professional judgment when responding. A specific answer is not anticipated or expected.  Thank You, Nevin BloodgoodJoan B Cleotilde Spadaccini, RN, BSN, CCDS,Clinical Documentation Specialist:  [phone]  [phone]=[facility] [place] Information Management]

## 2015-10-06 NOTE — Progress Notes (Signed)
CRITICAL VALUE ALERT  Critical value received:  Gram + cocci and clusters in one aerobic bottle  Date of notification:  10/06/15  Time of notification:  1534  Critical value read back:yes  Nurse who received alert: Irven CoeLauren Tarisha Fader RN   MD notified (1st page):  Mannam on unit  Time of first page:  1630  MD notified (2nd page):  Time of second page:  Responding MD:  Mannam on unit  Time MD responded:  1630

## 2015-10-06 NOTE — Progress Notes (Signed)
**  Critical Care Interval Note**  Repositioning of tube taking place now.  Will plan for resuming of TF once post pyloric position.  Vent wean going better today.  Patient is currently still weaning.  Will continue to monitor.  Anticipate that if cannot be weaned from vent in next couple of days that patient will require a trach placement.  Ashly M. Nadine CountsGottschalk, DO PGY-2, [facility]

## 2015-10-06 NOTE — Progress Notes (Signed)
Nutrition Follow-up  DOCUMENTATION CODES:   Not applicable  INTERVENTION:    When feeding tube placed in post pyloric position, resume TF with Vital AF 1.2 at 10 ml/h, recommend advance rate by 10 ml every 4-6 hours to goal rate of 40 ml/h with Prostat 30 ml once daily to provide 1252 kcals, 87 gm protein, 779 ml free water daily.  NUTRITION DIAGNOSIS:   Inadequate oral intake related to inability to eat as evidenced by NPO status.  Ongoing  GOAL:   Patient will meet greater than or equal to 90% of their needs  Unmet  MONITOR:   TF tolerance, Vent status, Labs, Weight trends, I & O's  ASSESSMENT:   80 year old [facility] for iliac artery stent placement. Procedure was done, no stent needed. Post-operatively she complained of nausea and had hypotension. Distended abdomen. CT with with retroperitoneal blood, not actively bleeding per Dr. Bonnielee HaffHoss.  CORTRAK tube placement attempted yesterday, but could not place in post pyloric position. Emesis x 1 yesterday AM. TF on hold at this time. Tube tip is in the stomach. Orders for IR to place tube post pyloric. Patient has not had adequate nutrition since admission, due to TF being held or kept at a low rate due to intolerance.   Patient is currently intubated on ventilator support MV: 9.1 L/min Temp (24hrs), Avg:98.4 F (36.9 C), Min:97.9 F (36.6 C), Max:98.6 F (37 C)  Diet Order:   NPO  Skin:  Reviewed, no issues  Last BM:  11/9  Height:   Ht Readings from Last 1 Encounters:  10/09/2015 5' (1.524 m)    Weight:   Wt Readings from Last 1 Encounters:  10/06/15 128 lb 12 oz (58.4 kg)    Ideal Body Weight:  45.5 kg  BMI:  Body mass index is 25.14 kg/(m^2).  Estimated Nutritional Needs:   Kcal:  1200  Protein:  80-90 gm  Fluid:  1.5 L  EDUCATION NEEDS:   No education needs identified at this time  Joaquin CourtsKimberly Brittley Regner, RD, LDN, CNSC Pager [phone] After  Hours Pager [phone]

## 2015-10-06 NOTE — Progress Notes (Signed)
  [facility] Progress Note  Subjective  - POD #8  Sedated on vent.   Tmax 98.6 BP sys 80s-160s 100% Fi02 30%  Objective Filed Vitals:   10/06/15 0746  BP:   Pulse:   Temp: 98.4 F (36.9 C)  Resp:      Intake/Output Summary (Last 24 hours) at 10/06/15 0931 Last data filed at 10/06/15 0700  Gross per 24 hour  Intake 1046.89 ml  Output   -295 ml  Net 1341.89 ml    Abdomen soft. Midline and RLQ incisions clean. Some superficial skin tears on abdomen. These are clean.  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   Assessment/Planning: 80 y.o. female is s/p: evacuation of retroperitoneal hematoma, repair of right external iliac artery.  9 Days Post-Op   Necrotic right toes stable, without evidence of infection.  AKI: Minimal urine output. For IHD.  Unsuccessful vent weaning.  Fever: Empiric vanc and zosyn started. WBC improving and fevers improving.   [name] Herman 10/06/2015 9:31 AM --  Laboratory CBC    Component Value Date/Time   WBC 15.5* 10/06/2015 0515   HGB 7.3* 10/06/2015 0515   HCT 21.3* 10/06/2015 0515   PLT 100* 10/06/2015 0515    BMET    Component Value Date/Time   NA 137 10/06/2015 0515   K 4.0 10/06/2015 0515   CL 102 10/06/2015 0515   CO2 25 10/06/2015 0515   GLUCOSE 100* 10/06/2015 0515   BUN 55* 10/06/2015 0515   CREATININE 2.67* 10/06/2015 0515   CREATININE 1.07* 08/02/2015 1427   CALCIUM 7.7* 10/06/2015 0515   GFRNONAA 16* 10/06/2015 0515   GFRAA 19* 10/06/2015 0515    COAG Lab Results  Component Value Date   INR 1.56* 10/06/2015   INR 1.35 10/01/2015   INR 1.52* 09/30/2015   No results found for: PTT  Antibiotics Anti-infectives    Start     Dose/Rate Route Frequency Ordered Stop   10/05/15 1200  vancomycin (VANCOCIN) 1,250 mg in sodium chloride 0.9 % 250 mL IVPB     1,250 mg 166.7 mL/hr over 90 Minutes Intravenous  Once 10/05/15 1028 10/05/15 1250   10/05/15 1030  piperacillin-tazobactam (ZOSYN) IVPB 2.25  g     2.25 g 100 mL/hr over 30 Minutes Intravenous Every 8 hours 10/05/15 1028     07-Feb-2015 1200  ceFAZolin (ANCEF) IVPB 2 g/50 mL premix     2 g 100 mL/hr over 30 Minutes Intravenous  Once 07-Feb-2015 1153 07-Feb-2015 1223       Maris BergerKimberly Cedricka Sackrider, PA-C [facility] Office: [phone] Pager: [phone] 10/06/2015 9:31 AM

## 2015-10-06 NOTE — Progress Notes (Signed)
Subjective:  Afebrile- BP remains labile- s/p IHD yesterday- no appreciable UOP Objective Vital signs in last 24 hours: Filed Vitals:   10/06/15 0600 10/06/15 0700 10/06/15 0709 10/06/15 0714  BP: 91/47 148/79    Pulse: 76 102 90   Temp:      TempSrc:      Resp: Height:      Weight:      SpO2: 100% 100% 100% 100%   Weight change: 0.9 kg (1 lb 15.8 oz)  Intake/Output Summary (Last 24 hours) at 10/06/15 0736 Last data filed at 10/06/15 0700  Gross per 24 hour  Intake 1227.86 ml  Output   -295 ml  Net 1522.86 ml    Assessment/ Plan: Pt is a 80 y.o. yo female who was admitted on 10-13-15 with hemorrhagic shock/PEA arrest after PAD intervention with resultant AKI  Assessment/Plan: 1. AKI- due to contrast nephropathy and ATN due to hemorrhagic shock and PEA arrest- had been CRRT dependent from 11/4-11/7, then IHD on 11/9.  Baseline creatinine in September was 1.07.  Still little UOP so likely will need IHD tomorrow.  volume seems OK with FIO2 of 30 and CVP in single digits.   AM still hopeful for eventual recovery.  Now CK is a little up- maybe an element of rhabdo  2. HTN/vol=- CVP down and FIo2 30% - Feeling like low BP has to do with sedation- it does get higher at times- no pressors- no volume removal needed 3. Anemia- after hemorrhagic shock- RPH- hgb dropping slowly- I will add darbe but likely will need transfusion- maybe with HD tomrrow 4. Elytes- hyperkalemia- corrected with HD 5. VDRF- per CCM 6. Fever- per CCM- now on zosyn and vanc, possibly due to hematoma- wbc down today, fever curve is decreasing   [name] Herman A    Labs: Basic Metabolic Panel:  Recent Labs Lab 10/04/15 0540  10/05/15 0410 10/05/15 1627 10/06/15 0515  NA 140  --  141  --  137  K 4.8  --  5.5*  --  4.0  CL 105  --  107  --  102  CO2 24  --  23  --  25  GLUCOSE 140*  --  149*  --  100*  BUN 53*  --  100*  --  55*  CREATININE 2.76*  --  4.23*  --  2.67*  CALCIUM 7.7*  --   8.0*  --  7.7*  PHOS 3.8  < > 3.7 2.1* 3.3  < > = values in this interval not displayed. Liver Function Tests:  Recent Labs Lab 10/03/15 0350 10/04/15 0540 10/05/15 0410 10/06/15 0515  AST 117* 127* 74*  --   ALT 103* 87* 74*  --   ALKPHOS 96 162* 182*  --   BILITOT 13.5* 16.9* 10.3*  --   PROT 5.7* 5.0* 5.3*  --   ALBUMIN 2.5* 2.1* 2.1* 2.2*   No results for input(s): LIPASE, AMYLASE in the last 168 hours. No results for input(s): AMMONIA in the last 168 hours. CBC:  Recent Labs Lab 10/02/15 0429 10/03/15 0350 10/04/15 0540 10/05/15 0410 10/06/15 0515  WBC 15.6* 13.0* 11.7* 17.9* 15.5*  HGB 9.3* 9.5* 8.3* 8.4* 7.3*  HCT 28.0* 28.6* 25.5* 25.8* 21.3*  MCV 88.3 88.8 88.9 88.1 87.7  PLT 52* 63* 73* 92* 100*   Cardiac Enzymes:  Recent Labs Lab 10/01/15 0130  CKTOTAL 2174*  CKMB 36.5*   CBG:  Recent Labs Lab 10/05/15 1153  10/05/15 1524 10/05/15 1934 10/05/15 2338 10/06/15 0341  GLUCAP 92 104* 93 90 98    Iron Studies: No results for input(s): IRON, TIBC, TRANSFERRIN, FERRITIN in the last 72 hours. Studies/Results: Dg Chest Port 1 View  10/05/2015  CLINICAL DATA:  Acute respiratory failure.  Shortness of breath. EXAM: PORTABLE CHEST 1 VIEW COMPARISON:  10/04/2015. FINDINGS: Endotracheal tube, NG tube, right IJ line in stable position. Heart size stable. Patchy bibasilar infiltrates again noted. No pleural effusion or pneumothorax . No pneumothorax. IMPRESSION: 1. Lines and tubes in stable position . 2. Patchy bibasilar pulmonary infiltrates. Electronically Signed   By: Maisie Fushomas  Register   On: 10/05/2015 07:11   Dg Abd Portable 1v  10/05/2015  CLINICAL DATA:  Feeding tube placement EXAM: PORTABLE ABDOMEN - 1 VIEW COMPARISON:  None. FINDINGS: The distal feeding tube is coiled in the stomach with the tip of the feeding tube in the proximal body of the stomach. The bowel gas pattern is normal. No obstruction or free air is appreciable on this supine examination.  There are multiple skin staples. There is mild atelectasis in the left lung base. IMPRESSION: Feeding tube tip in stomach.  Bowel gas pattern unremarkable. Electronically Signed   By: Bretta BangWilliam  Woodruff III M.D.   On: 10/05/2015 17:57   Medications: Infusions: . sodium chloride 10 mL/hr at 10/01/15 2000  . dexmedetomidine 1.2 mcg/kg/hr (10/06/15 0726)  . fentaNYL infusion INTRAVENOUS 25 mcg/hr (10/05/15 2313)    Scheduled Medications: . antiseptic oral rinse  7 mL Mouth Rinse QID  . budesonide  0.25 mg Nebulization Q12H  . chlorhexidine gluconate  15 mL Mouth Rinse BID  . feeding supplement (VITAL AF 1.2 CAL)  1,000 mL Per Tube Q24H  . fentaNYL (SUBLIMAZE) injection  50 mcg Intravenous Once  . hydrocortisone sod succinate (SOLU-CORTEF) inj  50 mg Intravenous Q12H  . insulin aspart  0-9 Units Subcutaneous 6 times per day  . ipratropium-albuterol  3 mL Nebulization Q4H  . pantoprazole (PROTONIX) IV  40 mg Intravenous Q12H  . piperacillin-tazobactam (ZOSYN)  IV  2.25 g Intravenous Q8H    have reviewed scheduled and prn medications.  Physical Exam: General: eyes open- no commands- jaundiced- on vent Heart: RRR Lungs: CBS bilat Abdomen: distended Extremities: some pitting edema- black toes on right Dialysis Access: right IJ vascath placed 11/4    10/06/2015,7:36 AM  LOS: 9 days

## 2015-10-07 ENCOUNTER — Inpatient Hospital Stay (HOSPITAL_COMMUNITY): Payer: Commercial Managed Care - HMO

## 2015-10-07 DIAGNOSIS — Z9689 Presence of other specified functional implants: Secondary | ICD-10-CM | POA: Insufficient documentation

## 2015-10-07 DIAGNOSIS — J939 Pneumothorax, unspecified: Secondary | ICD-10-CM

## 2015-10-07 DIAGNOSIS — Z789 Other specified health status: Secondary | ICD-10-CM

## 2015-10-07 LAB — BLOOD GAS, ARTERIAL
Acid-base deficit: 3.7 mmol/L — ABNORMAL HIGH (ref 0.0–2.0)
Bicarbonate: 19.8 mEq/L — ABNORMAL LOW (ref 20.0–24.0)
DRAWN BY: 39898
FIO2: 1
O2 Saturation: 99.9 %
PATIENT TEMPERATURE: 98.6
PEEP: 5 cmH2O
RATE: 12 resp/min
TCO2: 20.7 mmol/L (ref 0–100)
VT: 400 mL
pCO2 arterial: 29.7 mmHg — ABNORMAL LOW (ref 35.0–45.0)
pH, Arterial: 7.439 (ref 7.350–7.450)
pO2, Arterial: 366 mmHg — ABNORMAL HIGH (ref 80.0–100.0)

## 2015-10-07 LAB — MAGNESIUM
MAGNESIUM: 1.8 mg/dL (ref 1.7–2.4)
Magnesium: 2.1 mg/dL (ref 1.7–2.4)

## 2015-10-07 LAB — POCT I-STAT 3, ART BLOOD GAS (G3+)
ACID-BASE DEFICIT: 5 mmol/L — AB (ref 0.0–2.0)
ACID-BASE EXCESS: 4 mmol/L — AB (ref 0.0–2.0)
BICARBONATE: 22.9 meq/L (ref 20.0–24.0)
Bicarbonate: 27.1 mEq/L — ABNORMAL HIGH (ref 20.0–24.0)
O2 SAT: 99 %
O2 Saturation: 100 %
PH ART: 7.215 — AB (ref 7.350–7.450)
PH ART: 7.513 — AB (ref 7.350–7.450)
Patient temperature: 98.9
TCO2: 25 mmol/L (ref 0–100)
TCO2: 28 mmol/L (ref 0–100)
pCO2 arterial: 33.7 mmHg — ABNORMAL LOW (ref 35.0–45.0)
pCO2 arterial: 56.6 mmHg — ABNORMAL HIGH (ref 35.0–45.0)
pO2, Arterial: 104 mmHg — ABNORMAL HIGH (ref 80.0–100.0)
pO2, Arterial: 263 mmHg — ABNORMAL HIGH (ref 80.0–100.0)

## 2015-10-07 LAB — PROCALCITONIN: Procalcitonin: 2.87 ng/mL

## 2015-10-07 LAB — RENAL FUNCTION PANEL
ALBUMIN: 2.1 g/dL — AB (ref 3.5–5.0)
ANION GAP: 14 (ref 5–15)
BUN: 94 mg/dL — AB (ref 6–20)
CALCIUM: 7.8 mg/dL — AB (ref 8.9–10.3)
CO2: 21 mmol/L — ABNORMAL LOW (ref 22–32)
Chloride: 104 mmol/L (ref 101–111)
Creatinine, Ser: 3.99 mg/dL — ABNORMAL HIGH (ref 0.44–1.00)
GFR calc Af Amer: 11 mL/min — ABNORMAL LOW (ref >60)
GFR, EST NON AFRICAN AMERICAN: 10 mL/min — AB (ref >60)
GLUCOSE: 141 mg/dL — AB (ref 65–99)
PHOSPHORUS: 5.2 mg/dL — AB (ref 2.5–4.6)
Potassium: 4.3 mmol/L (ref 3.5–5.1)
SODIUM: 139 mmol/L (ref 135–145)

## 2015-10-07 LAB — GLUCOSE, CAPILLARY
GLUCOSE-CAPILLARY: 110 mg/dL — AB (ref 65–99)
GLUCOSE-CAPILLARY: 118 mg/dL — AB (ref 65–99)
GLUCOSE-CAPILLARY: 121 mg/dL — AB (ref 65–99)
GLUCOSE-CAPILLARY: 127 mg/dL — AB (ref 65–99)
GLUCOSE-CAPILLARY: 139 mg/dL — AB (ref 65–99)
GLUCOSE-CAPILLARY: 151 mg/dL — AB (ref 65–99)
GLUCOSE-CAPILLARY: 91 mg/dL (ref 65–99)

## 2015-10-07 LAB — VANCOMYCIN, RANDOM: VANCOMYCIN RM: 16 ug/mL

## 2015-10-07 LAB — CBC
HCT: 27.8 % — ABNORMAL LOW (ref 36.0–46.0)
HEMATOCRIT: 20.7 % — AB (ref 36.0–46.0)
HEMOGLOBIN: 6.9 g/dL — AB (ref 12.0–15.0)
Hemoglobin: 9.7 g/dL — ABNORMAL LOW (ref 12.0–15.0)
MCH: 29 pg (ref 26.0–34.0)
MCH: 28.8 pg (ref 26.0–34.0)
MCHC: 33.3 g/dL (ref 30.0–36.0)
MCHC: 34.9 g/dL (ref 30.0–36.0)
MCV: 87 fL (ref 78.0–100.0)
MCV: 82.5 fL (ref 78.0–100.0)
PLATELETS: 139 10*3/uL — AB (ref 150–400)
PLATELETS: 151 10*3/uL (ref 150–400)
RBC: 2.38 MIL/uL — AB (ref 3.87–5.11)
RBC: 3.37 MIL/uL — AB (ref 3.87–5.11)
RDW: 18.2 % — ABNORMAL HIGH (ref 11.5–15.5)
RDW: 16.6 % — ABNORMAL HIGH (ref 11.5–15.5)
WBC: 16.8 10*3/uL — AB (ref 4.0–10.5)
WBC: 25.7 10*3/uL — AB (ref 4.0–10.5)

## 2015-10-07 LAB — PREPARE RBC (CROSSMATCH)

## 2015-10-07 LAB — PHOSPHORUS: PHOSPHORUS: 2.4 mg/dL — AB (ref 2.5–4.6)

## 2015-10-07 MED ORDER — VANCOMYCIN HCL 500 MG IV SOLR: 500.0000 mg | INTRAVENOUS | Status: DC

## 2015-10-07 MED ORDER — SODIUM CHLORIDE 0.9 % IV SOLN: Freq: Once | INTRAVENOUS | Status: DC

## 2015-10-07 MED ORDER — PRO-STAT SUGAR FREE PO LIQD
30.0000 mL | Freq: Every day | ORAL | Status: DC
  Administered 2015-10-07 – 2015-10-09 (×3): 30 mL
  Filled 2015-10-07 (×4): qty 30

## 2015-10-07 MED ORDER — HYDROCORTISONE NA SUCCINATE PF 100 MG IJ SOLR
50.0000 mg | Freq: Four times a day (QID) | INTRAMUSCULAR | Status: DC
  Administered 2015-10-07 – 2015-10-12 (×20): 50 mg via INTRAVENOUS
  Filled 2015-10-07 (×4): qty 1
  Filled 2015-10-07: qty 2
  Filled 2015-10-07 (×12): qty 1
  Filled 2015-10-07: qty 2
  Filled 2015-10-07 (×4): qty 1

## 2015-10-07 MED ORDER — LIDOCAINE HCL (PF) 1 % IJ SOLN
INTRAMUSCULAR | Status: AC
  Filled 2015-10-07: qty 10

## 2015-10-07 MED ORDER — HYDROCORTISONE NA SUCCINATE PF 100 MG IJ SOLR: 30.0000 mg | Freq: Two times a day (BID) | INTRAMUSCULAR | Status: DC

## 2015-10-07 MED ORDER — VITAL AF 1.2 CAL PO LIQD
1000.0000 mL | ORAL | Status: DC
Start: 2015-10-07 — End: 2015-10-11
  Administered 2015-10-07 – 2015-10-08 (×3): 1000 mL
  Filled 2015-10-07 (×6): qty 1000

## 2015-10-07 MED ORDER — LIDOCAINE HCL (PF) 1 % IJ SOLN
INTRAMUSCULAR | Status: AC
  Administered 2015-10-07: 15 mL
  Filled 2015-10-07: qty 10

## 2015-10-07 MED ORDER — EPINEPHRINE HCL 0.1 MG/ML IJ SOSY
PREFILLED_SYRINGE | INTRAMUSCULAR | Status: AC
  Filled 2015-10-07: qty 10

## 2015-10-07 NOTE — Progress Notes (Signed)
Subjective:  Had CTs  placed for pneumothorax- BP remains labile- really no UOP Objective Vital signs in last 24 hours: Filed Vitals:   10/07/15 0500 10/07/15 0530 10/07/15 0600 10/07/15 0700  BP: 95/41  119/86 149/65  Pulse: 78 91 100 109  Temp:      TempSrc:      Resp: 38  Height:      Weight:      SpO2: 100% 100% 100% 100%   Weight change: -0.8 kg (-1 lb 12.2 oz)  Intake/Output Summary (Last 24 hours) at 10/07/15 0743 Last data filed at 10/07/15 0700  Gross per 24 hour  Intake    947 ml  Output    159 ml  Net    788 ml    Assessment/ Plan: Pt is a 80 y.o. yo female who was admitted on 10/24/2015 with hemorrhagic shock/PEA arrest after PAD intervention with resultant AKI - prolonged hospital course Assessment/Plan: 1. AKI- due to contrast nephropathy and ATN due to hemorrhagic shock and PEA arrest- had been CRRT dependent from 11/4-11/7, then IHD on 11/9 and today.  Baseline creatinine in September was 1.07.  Still little UOP so will need IHD today.  volume seems OK with FIO2 of 30 and CVP in single digits.  Now CK is a little up- maybe an element of rhabdo  2. HTN/vol=- CVP down and FIo2 30% - Feeling like low BP has to do with sedation- it does get higher at times- no pressors- will try for a little volume removal-is third spacing 3. Anemia- after hemorrhagic shock- RPH- hgb dropping slowly- I will add darbe but likely will need transfusion- will give one unit with HD today 4. Elytes- hyperkalemia- corrected with HD 5. VDRF- per CCM 6. Fever- per CCM- now on zosyn and vanc, possibly due to hematoma- wbc up today, fever curve is decreasing   [name] Herman A    Labs: Basic Metabolic Panel:  Recent Labs Lab 10/05/15 0410  10/06/15 0515 10/06/15 1713 10/07/15 0349  NA 141  --  137  --  139  K 5.5*  --  4.0  --  4.3  CL 107  --  102  --  104  CO2 23  --  25  --  21*  GLUCOSE 149*  --  100*  --  141*  BUN 100*  --  55*  --  94*  CREATININE 4.23*  --   2.67*  --  3.99*  CALCIUM 8.0*  --  7.7*  --  7.8*  PHOS 3.7  < > 3.3 4.5 5.2*  < > = values in this interval not displayed. Liver Function Tests:  Recent Labs Lab 10/03/15 0350 10/04/15 0540 10/05/15 0410 10/06/15 0515 10/07/15 0349  AST 117* 127* 74*  --   --   ALT 103* 87* 74*  --   --   ALKPHOS 96 162* 182*  --   --   BILITOT 13.5* 16.9* 10.3*  --   --   PROT 5.7* 5.0* 5.3*  --   --   ALBUMIN 2.5* 2.1* 2.1* 2.2* 2.1*   No results for input(s): LIPASE, AMYLASE in the last 168 hours. No results for input(s): AMMONIA in the last 168 hours. CBC:  Recent Labs Lab 10/03/15 0350 10/04/15 0540 10/05/15 0410 10/06/15 0515 10/07/15 0349  WBC 13.0* 11.7* 17.9* 15.5* 16.8*  HGB 9.5* 8.3* 8.4* 7.3* 6.9*  HCT 28.6* 25.5* 25.8* 21.3* 20.7*  MCV 88.8 88.9 88.1 87.7 87.0  PLT 63* 73* 92* 100* 139*   Cardiac Enzymes:  Recent Labs Lab 10/01/15 0130  CKTOTAL 2174*  CKMB 36.5*   CBG:  Recent Labs Lab 10/06/15 1136 10/06/15 1528 10/06/15 1913 10/06/15 2330 10/07/15 0311  GLUCAP 108* 96 110* 121* 139*    Iron Studies: No results for input(s): IRON, TIBC, TRANSFERRIN, FERRITIN in the last 72 hours. Studies/Results: Dg Abd 1 View  10/06/2015  CLINICAL DATA:  Feeding tube placement EXAM: ABDOMEN - 1 VIEW COMPARISON:  10/05/2015 FLUOROSCOPY TIME:  1 minutes 46 seconds Images obtained: 1 CONTRAST:  15 mL Omnipaque 300 FINDINGS: Small amount of contrast has been injected through the feeding tube. Feeding tube traverses stomach, duodenum and extends into proximal jejunum beyond ligament of Treitz. Injected contrast material opacifies a normal-appearing small bowel loop. IMPRESSION: Tip of feeding tube in proximal jejunum beyond ligament of Treitz. Electronically Signed   By: Ulyses Southward M.D.   On: 10/06/2015 14:45   Dg Chest Port 1 View  10/07/2015  CLINICAL DATA:  Chest tube placement. EXAM: PORTABLE CHEST 1 VIEW COMPARISON:  Earlier this day at 0028 hour FINDINGS: Placement  of right-sided chest tube with near complete resolution of right pneumothorax, suspect tiny apical component persisting. Placement of left-sided chest tube with complete resolution of left pneumothorax. There is subcutaneous emphysema about the lateral left chest wall. Subcutaneous emphysema noted about supraclavicular regions, unchanged. Probable pneumomediastinum noted about the thoracic inlet. Endotracheal tube now 1 cm from the carina, recommend retraction of approximately 2 cm. Enteric tube and bilateral internal jugular catheters are unchanged. Left perihilar atelectasis, more confluent than prior. Bibasilar atelectasis again seen. Linear air in the upper abdomen again seen. IMPRESSION: 1. Bilateral chest tube placement with decreased bilateral pneumothoraces, question tiny persistent right apical component on the right. Complete resolution on the left. 2. Endotracheal tube is now 1 cm from the carina, consider retraction of 1-2 cm. 3. Subcutaneous emphysema in both supraclavicular regions. Probable pneumomediastinum at the thoracic inlet. Linear air again seen in the upper abdomen, location uncertain. Electronically Signed   By: Rubye Oaks M.D.   On: 10/07/2015 02:19   Dg Chest Port 1 View  10/07/2015  CLINICAL DATA:  Decreased oxygen saturation.  Intubated. EXAM: PORTABLE CHEST 1 VIEW COMPARISON:  Radiographs obtained yesterday. FINDINGS: Bilateral pneumothoraces, new. This is moderate on the left approximately 20% and small on the right. There is subcutaneous emphysema in both supraclavicular regions. There is linear air in the midline upper abdomen that is not definitively intraluminal. Endotracheal tube 2.1 cm from the carina. Enteric tube in place, tip below the diaphragm. Right internal jugular catheter tip in the SVC. Left internal jugular venous catheter in the region of the brachiocephalic vein. Cardiomediastinal contours are unchanged. Bilateral perihilar atelectasis, worsening in the left  perihilar lung. There is no mediastinal shift. IMPRESSION: 1. New bilateral pneumothoraces, moderate on the left and small on the right. Development of supraclavicular subcutaneous emphysema. 2. Linear air in the upper abdomen is not definitively intraluminal, and may be tracking from the thorax given the pneumothoraces and supraclavicular air. CT could provide detailed characterization. 3. Increased left perihilar atelectasis. Critical Value/emergent results were called by telephone at the time of interpretation on 10/07/2015 at 12:41 am to PA Celine Mans, who verbally acknowledged these results. Electronically Signed   By: Rubye Oaks M.D.   On: 10/07/2015 00:46   Dg Chest Port 1 View  10/06/2015  CLINICAL DATA:  Respiratory failure, ventilated patient, hemorrhagic shock and cardiac arrest. EXAM:  PORTABLE CHEST 1 VIEW COMPARISON:  Portable chest x-ray of October 05, 2015 FINDINGS: The lungs are adequately inflated. The interstitial markings are slightly less conspicuous today. Confluent infrahilar density persists bilaterally. There is a trace of pleural fluid on the left. The heart is normal in size. The pulmonary vascularity is prominent centrally without significant cephalization. The endotracheal tube tip projects 2 point 1 cm above the carina. The feeding tube tip projects below the inferior margin of the image. The dual-lumen right internal jugular venous catheter tip projects over the midportion of the SVC. The left internal jugular venous catheter tip projects at the junction of the left internal jugular vein and left subclavian vein. Subtle kinks are noted proximally in 2 places in this catheter. There is a tubular structure that projects to the right of the endotracheal tube which likely lies external to the patient. There are degenerative changes of the right shoulder. IMPRESSION: *Stable coarse infrahilar subsegmental atelectasis and trace left pleural effusion. Stable mild interstitial prominence.  The support tubes are in reasonable position. Electronically Signed   By: David  SwazilandJordan M.D.   On: 10/06/2015 09:32   Dg Abd Portable 1v  10/05/2015  CLINICAL DATA:  Feeding tube placement EXAM: PORTABLE ABDOMEN - 1 VIEW COMPARISON:  None. FINDINGS: The distal feeding tube is coiled in the stomach with the tip of the feeding tube in the proximal body of the stomach. The bowel gas pattern is normal. No obstruction or free air is appreciable on this supine examination. There are multiple skin staples. There is mild atelectasis in the left lung base. IMPRESSION: Feeding tube tip in stomach.  Bowel gas pattern unremarkable. Electronically Signed   By: Bretta BangWilliam  Woodruff III M.D.   On: 10/05/2015 17:57   Dg Vangie BickerNaso G Tube Plc W/fl-no Rad  10/06/2015  CLINICAL DATA:  NASO G TUBE PLACEMENT WITH FLUORO Fluoroscopy was utilized by the requesting physician.  No radiographic interpretation.   Medications: Infusions: . sodium chloride 10 mL/hr at 10/06/15 2000  . dexmedetomidine 0.9 mcg/kg/hr (10/07/15 16100337)  . fentaNYL infusion INTRAVENOUS 25 mcg/hr (10/07/15 0200)    Scheduled Medications: . sodium chloride   Intravenous Once  . antiseptic oral rinse  7 mL Mouth Rinse QID  . budesonide  0.25 mg Nebulization Q12H  . chlorhexidine gluconate  15 mL Mouth Rinse BID  . darbepoetin (ARANESP) injection - NON-DIALYSIS  100 mcg Subcutaneous Q Thu-1800  . feeding supplement (VITAL AF 1.2 CAL)  1,000 mL Per Tube Q24H  . fentaNYL (SUBLIMAZE) injection  50 mcg Intravenous Once  . haloperidol lactate  5 mg Intravenous BID  . hydrocortisone sod succinate (SOLU-CORTEF) inj  50 mg Intravenous Q12H  . insulin aspart  0-9 Units Subcutaneous 6 times per day  . ipratropium-albuterol  3 mL Nebulization Q4H  . pantoprazole (PROTONIX) IV  40 mg Intravenous Q12H  . piperacillin-tazobactam (ZOSYN)  IV  2.25 g Intravenous Q8H    have reviewed scheduled and prn medications.  Physical Exam: General: eyes open- no commands-  jaundiced- on vent Heart: RRR Lungs: CBS bilat Abdomen: distended Extremities:  pitting edema- black toes on right Dialysis Access: right IJ vascath placed 11/4    10/07/2015,7:43 AM  LOS: 10 days

## 2015-10-07 NOTE — Progress Notes (Signed)
Per Dr Isaiah SergeMannam, tidal volume increased to 450. RT made aware of vent change.

## 2015-10-07 NOTE — Progress Notes (Signed)
Hemodialysis complete. Pt given 1 unit prbcs on hd. Total UF 1.1L. Vitals stable post rinseback. Report given to primary RN.

## 2015-10-07 NOTE — Progress Notes (Signed)
eLink Physician-Brief Progress Note Patient Name: [name] DOB: 02/24/1935 MRN: [id number]   Date of Service  10/07/2015  HPI/Events of Note  Multiple issues: 1. Ventilator setting adjusted by physician. No order for change. 2. Do you want tracheal aspirate culture.   eICU Interventions  Will order: 1. TV = 450 per prior physician change.  2. ABG at 11 PM. 3. Tracheal aspirate culture.      Intervention Category Major Interventions: Respiratory failure - evaluation and management  Malikye Reppond Eugene 10/07/2015, 10:08 PM

## 2015-10-07 NOTE — Progress Notes (Signed)
RT note: RT called to patients room by RN. RN anbu bagging patient stated it was very difficult to bag. RT disconnected bag and air escaped from et tube. RT could not pass suction catheter. RT lavauged patient times two and cleared et tube obstruction. Patients vital signs became stable and patient was easier to bag. Returned patient previous vent setting. CXR ordered by MD.

## 2015-10-07 NOTE — Progress Notes (Signed)
Referring Physician(s): Hoss,Arthur   Chief Complaint: Retroperitoneal bleed post IR procedure 11/1 Hemorrhagic shock/cardiac arrest  Subjective:  Intubated B chest tubes placed last pm Development of B PTX yesterday pm No response    Allergies: Meloxicam and Meperidine hcl  Medications: Prior to Admission medications   Medication Sig Start Date End Date Taking? Authorizing Provider  albuterol (PROVENTIL HFA;VENTOLIN HFA) 108 (90 BASE) MCG/ACT inhaler Inhale 2 puffs into the lungs every 4 (four) hours as needed for wheezing. 06/22/15 04/18/17 Yes Kristian Covey, MD  aspirin EC 81 MG tablet Take 81 mg by mouth daily.   Yes Historical Provider, MD  atorvastatin (LIPITOR) 10 MG tablet Take 1 tablet (10 mg total) by mouth daily. 08/25/15  Yes Kristian Covey, MD  Biotin 1000 MCG tablet Take 1,000 mcg by mouth daily.   Yes Historical Provider, MD  Calcium Carb-Cholecalciferol (CALCIUM 1000 + D PO) Take 1 tablet by mouth daily.    Yes Historical Provider, MD  clopidogrel (PLAVIX) 75 MG tablet Take 1 tablet (75 mg total) by mouth daily. 08/22/15  Yes Kristian Covey, MD  cyanocobalamin 500 MCG tablet Take 500 mcg by mouth daily.   Yes Historical Provider, MD  latanoprost (XALATAN) 0.005 % ophthalmic solution Place 1 drop into the right eye at bedtime.     Yes Historical Provider, MD  mometasone [facility]) 220 MCG/INH inhaler Inhale 2 puffs into the lungs daily. 01/05/15  Yes Kristian Covey, MD  Multiple Vitamins-Minerals (OCUVITE PO) Take by mouth.   Yes Historical Provider, MD  oxyCODONE-acetaminophen (PERCOCET) 10-325 MG per tablet Take 1 tablet by mouth every 6 (six) hours as needed for pain. 07/28/15 07/27/16 Yes Kristian Covey, MD  pantoprazole (PROTONIX) 40 MG tablet TAKE 1 TABLET (40 MG TOTAL) BY MOUTH DAILY. 07/14/15  Yes Kristian Covey, MD  predniSONE (DELTASONE) 10 MG tablet Take 10 mg by mouth daily with breakfast.   Yes Historical Provider, MD  sertraline (ZOLOFT)  100 MG tablet Take 0.5 tablets (50 mg total) by mouth daily. 07/14/15  Yes Kristian Covey, MD  triamterene-hydrochlorothiazide (DYAZIDE) 37.5-25 MG capsule TAKE ONE CAPSULE BY MOUTH EVERY MORNING 08/29/15  Yes Kristian Covey, MD     Vital Signs: BP 145/53 mmHg  Pulse 109  Temp(Src) 99.8 F (37.7 C) (Oral)  Resp 29  Ht 5' (1.524 m)  Wt 126 lb 15.8 oz (57.6 kg)  BMI 24.80 kg/m2  SpO2 100%  Physical Exam  Cardiovascular: Normal rate.   Pulmonary/Chest:  vent  Abdominal: Soft. Bowel sounds are normal.  Musculoskeletal:  Rt toes unchanged  Skin: Skin is warm.  Nursing note and vitals reviewed.   Imaging: Dg Abd 1 View  10/06/2015  CLINICAL DATA:  Feeding tube placement EXAM: ABDOMEN - 1 VIEW COMPARISON:  10/05/2015 FLUOROSCOPY TIME:  1 minutes 46 seconds Images obtained: 1 CONTRAST:  15 mL Omnipaque 300 FINDINGS: Small amount of contrast has been injected through the feeding tube. Feeding tube traverses stomach, duodenum and extends into proximal jejunum beyond ligament of Treitz. Injected contrast material opacifies a normal-appearing small bowel loop. IMPRESSION: Tip of feeding tube in proximal jejunum beyond ligament of Treitz. Electronically Signed   By: Ulyses Southward M.D.   On: 10/06/2015 14:45   Dg Chest Port 1 View  10/07/2015  CLINICAL DATA:  Chest tube placement. EXAM: PORTABLE CHEST 1 VIEW COMPARISON:  Earlier this day at 0028 hour FINDINGS: Placement of right-sided chest tube with near complete resolution of right pneumothorax,  suspect tiny apical component persisting. Placement of left-sided chest tube with complete resolution of left pneumothorax. There is subcutaneous emphysema about the lateral left chest wall. Subcutaneous emphysema noted about supraclavicular regions, unchanged. Probable pneumomediastinum noted about the thoracic inlet. Endotracheal tube now 1 cm from the carina, recommend retraction of approximately 2 cm. Enteric tube and bilateral internal jugular  catheters are unchanged. Left perihilar atelectasis, more confluent than prior. Bibasilar atelectasis again seen. Linear air in the upper abdomen again seen. IMPRESSION: 1. Bilateral chest tube placement with decreased bilateral pneumothoraces, question tiny persistent right apical component on the right. Complete resolution on the left. 2. Endotracheal tube is now 1 cm from the carina, consider retraction of 1-2 cm. 3. Subcutaneous emphysema in both supraclavicular regions. Probable pneumomediastinum at the thoracic inlet. Linear air again seen in the upper abdomen, location uncertain. Electronically Signed   By: Rubye OaksMelanie  Ehinger M.D.   On: 10/07/2015 02:19   Dg Chest Port 1 View  10/07/2015  CLINICAL DATA:  Decreased oxygen saturation.  Intubated. EXAM: PORTABLE CHEST 1 VIEW COMPARISON:  Radiographs obtained yesterday. FINDINGS: Bilateral pneumothoraces, new. This is moderate on the left approximately 20% and small on the right. There is subcutaneous emphysema in both supraclavicular regions. There is linear air in the midline upper abdomen that is not definitively intraluminal. Endotracheal tube 2.1 cm from the carina. Enteric tube in place, tip below the diaphragm. Right internal jugular catheter tip in the SVC. Left internal jugular venous catheter in the region of the brachiocephalic vein. Cardiomediastinal contours are unchanged. Bilateral perihilar atelectasis, worsening in the left perihilar lung. There is no mediastinal shift. IMPRESSION: 1. New bilateral pneumothoraces, moderate on the left and small on the right. Development of supraclavicular subcutaneous emphysema. 2. Linear air in the upper abdomen is not definitively intraluminal, and may be tracking from the thorax given the pneumothoraces and supraclavicular air. CT could provide detailed characterization. 3. Increased left perihilar atelectasis. Critical Value/emergent results were called by telephone at the time of interpretation on  10/07/2015 at 12:41 am to PA Celine Mansesai, who verbally acknowledged these results. Electronically Signed   By: Rubye OaksMelanie  Ehinger M.D.   On: 10/07/2015 00:46   Dg Chest Port 1 View  10/06/2015  CLINICAL DATA:  Respiratory failure, ventilated patient, hemorrhagic shock and cardiac arrest. EXAM: PORTABLE CHEST 1 VIEW COMPARISON:  Portable chest x-ray of October 05, 2015 FINDINGS: The lungs are adequately inflated. The interstitial markings are slightly less conspicuous today. Confluent infrahilar density persists bilaterally. There is a trace of pleural fluid on the left. The heart is normal in size. The pulmonary vascularity is prominent centrally without significant cephalization. The endotracheal tube tip projects 2 point 1 cm above the carina. The feeding tube tip projects below the inferior margin of the image. The dual-lumen right internal jugular venous catheter tip projects over the midportion of the SVC. The left internal jugular venous catheter tip projects at the junction of the left internal jugular vein and left subclavian vein. Subtle kinks are noted proximally in 2 places in this catheter. There is a tubular structure that projects to the right of the endotracheal tube which likely lies external to the patient. There are degenerative changes of the right shoulder. IMPRESSION: *Stable coarse infrahilar subsegmental atelectasis and trace left pleural effusion. Stable mild interstitial prominence. The support tubes are in reasonable position. Electronically Signed   By: David  SwazilandJordan M.D.   On: 10/06/2015 09:32   Dg Chest Port 1 View  10/05/2015  CLINICAL DATA:  Acute respiratory failure.  Shortness of breath. EXAM: PORTABLE CHEST 1 VIEW COMPARISON:  10/04/2015. FINDINGS: Endotracheal tube, NG tube, right IJ line in stable position. Heart size stable. Patchy bibasilar infiltrates again noted. No pleural effusion or pneumothorax . No pneumothorax. IMPRESSION: 1. Lines and tubes in stable position . 2. Patchy  bibasilar pulmonary infiltrates. Electronically Signed   By: Maisie Fus  Register   On: 10/05/2015 07:11   Dg Chest Port 1 View  10/04/2015  CLINICAL DATA:  Hypoxia EXAM: PORTABLE CHEST 1 VIEW COMPARISON:  October 03, 2015 FINDINGS: Endotracheal tube tip is essentially at the carina, very little change from 1 day prior. Central catheter tip is in the superior vena cava. Nasogastric tube tip and side port are below the diaphragm. No pneumothorax. There is patchy bibasilar atelectasis. Lungs elsewhere clear. Heart size and pulmonary vascularity are normal. No adenopathy. IMPRESSION: Tube and catheter positions as described without pneumothorax. Note that the endotracheal tube tip is essentially at the carina. It may be prudent to consider withdrawing endotracheal tube 2-3 cm. Patchy bibasilar atelectasis. No new opacity. No change in cardiac silhouette. These results will be called to the ordering clinician or representative by the Radiologist Assistant, and communication documented in the PACS or zVision Dashboard. Electronically Signed   By: Bretta Bang III M.D.   On: 10/04/2015 07:16   Dg Abd Portable 1v  10/05/2015  CLINICAL DATA:  Feeding tube placement EXAM: PORTABLE ABDOMEN - 1 VIEW COMPARISON:  None. FINDINGS: The distal feeding tube is coiled in the stomach with the tip of the feeding tube in the proximal body of the stomach. The bowel gas pattern is normal. No obstruction or free air is appreciable on this supine examination. There are multiple skin staples. There is mild atelectasis in the left lung base. IMPRESSION: Feeding tube tip in stomach.  Bowel gas pattern unremarkable. Electronically Signed   By: Bretta Bang III M.D.   On: 10/05/2015 17:57   Dg Vangie Bicker G Tube Plc W/fl-no Rad  10/06/2015  CLINICAL DATA:  NASO G TUBE PLACEMENT WITH FLUORO Fluoroscopy was utilized by the requesting physician.  No radiographic interpretation.    Labs:  CBC:  Recent Labs  10/04/15 0540  10/05/15 0410 10/06/15 0515 10/07/15 0349  WBC 11.7* 17.9* 15.5* 16.8*  HGB 8.3* 8.4* 7.3* 6.9*  HCT 25.5* 25.8* 21.3* 20.7*  PLT 73* 92* 100* 139*    COAGS:  Recent Labs  10-06-15 1030 2015/10/06 1900 2015/10/06 2324  09/29/15 0938 09/30/15 0619 10/01/15 0400 10/06/15 0830  INR 0.96 2.07* 3.30*  < > 1.67* 1.52* 1.35 1.56*  APTT 21* 56* 118*  --   --   --   --   --   < > = values in this interval not displayed.  BMP:  Recent Labs  10/04/15 0540 10/05/15 0410 10/06/15 0515 10/07/15 0349  NA 140 141 137 139  K 4.8 5.5* 4.0 4.3  CL 105 107 102 104  CO2 21*  GLUCOSE 140* 149* 100* 141*  BUN 53* 100* 55* 94*  CALCIUM 7.7* 8.0* 7.7* 7.8*  CREATININE 2.76* 4.23* 2.67* 3.99*  GFRNONAA 15* 9* 16* 10*  GFRAA 18* 11* 19* 11*    LIVER FUNCTION TESTS:  Recent Labs  10/02/15 0429  10/03/15 0350 10/04/15 0540 10/05/15 0410 10/06/15 0515 10/07/15 0349  BILITOT 8.7*  --  13.5* 16.9* 10.3*  --   --   AST 180*  --  117* 127* 74*  --   --  ALT 126*  --  103* 87* 74*  --   --   ALKPHOS 81  --  96 162* 182*  --   --   PROT 5.6*  --  5.7* 5.0* 5.3*  --   --   ALBUMIN 2.5*  < > 2.5* 2.1* 2.1* 2.2* 2.1*  < > = values in this interval not displayed.  Assessment and Plan:  RP bleed/hemorrhagic shock/PEA 11/1 after IR procedure Following CCM Will report to Dr Bonnielee Haff  Signed: Barrie Wale A 10/07/2015, 8:42 AM   I spent a total of 15 Minutes at the the patient's bedside AND on the patient's hospital floor or unit, greater than 50% of which was counseling/coordinating care for Retroperitoneal bleed

## 2015-10-07 NOTE — Progress Notes (Signed)
Nutrition Follow-up  DOCUMENTATION CODES:   Not applicable  INTERVENTION:    Advance Vital AF 1.2 by 10 ml every 8 hours to goal rate of 40 ml/h with Prostat 30 ml once daily to provide 1252 kcals, 87 gm protein, 779 ml free water daily.   NUTRITION DIAGNOSIS:   Inadequate oral intake related to inability to eat as evidenced by NPO status.  Ongoing  GOAL:   Patient will meet greater than or equal to 90% of their needs  Unmet  MONITOR:   TF tolerance, Vent status, Labs, Weight trends, I & O's  REASON FOR ASSESSMENT:   Consult Enteral/tube feeding initiation and management  ASSESSMENT:   80 year old female who [facility]11/2015 for iliac artery stent placement. Procedure was done, no stent needed. Post-operatively she complained of nausea and had hypotension. Distended abdomen. CT with with retroperitoneal blood, not actively bleeding per Dr. Bonnielee HaffHoss.  S/P feeding tube placement 11/10 with tip in the proximal jejunum beyond ligament of Treitz. Patient has tolerated trophic feedings of Vital AF 1.2 at 10 ml/h over night. Okay to slowly advance TF rate per discussion with CCM and RN.  Diet Order:   NPO  Skin:  Reviewed, no issues  Last BM:  11/10  Height:   Ht Readings from Last 1 Encounters:  12/27/14 5' (1.524 m)    Weight:   Wt Readings from Last 1 Encounters:  10/07/15 136 lb 7.4 oz (61.9 kg)    Ideal Body Weight:  45.5 kg  BMI:  Body mass index is 26.65 kg/(m^2).  Estimated Nutritional Needs:   Kcal:  1200  Protein:  80-90 gm  Fluid:  1.5 L  EDUCATION NEEDS:   No education needs identified at this time   Joaquin CourtsKimberly Harris, RD, LDN, CNSC Pager [phone] After Hours Pager [phone]

## 2015-10-07 NOTE — Progress Notes (Signed)
ANTIBIOTIC CONSULT NOTE - INITIAL  Pharmacy Consult for vanc/zosyn Indication: sepsis  Allergies  Allergen Reactions  . Meloxicam     GI upset  . Meperidine Hcl     GI upset    Patient Measurements: Height: 5' (152.4 cm) Weight: 136 lb 7.4 oz (61.9 kg) IBW/kg (Calculated) : 45.5  Vital Signs: Temp: 99.6 F (37.6 C) (11/11 1300) Temp Source: Axillary (11/11 1300) BP: 136/60 mmHg (11/11 1300) Pulse Rate: 112 (11/11 1300) Intake/Output from previous day: 11/10 0701 - 11/11 0700 In: 947 [I.V.:667.3; NG/GT:129.7; IV Piggyback:150] Out: 159 [Urine:19; Chest Tube:140] Intake/Output from this shift: Total I/O In: 675.1 [I.V.:125.1; Other:500; IV Piggyback:50] Out: 10 [Chest Tube:10]  Labs:  Recent Labs  10/05/15 0410 10/06/15 0515 10/07/15 0349  WBC 17.9* 15.5* 16.8*  HGB 8.4* 7.3* 6.9*  PLT 92* 100* 139*  CREATININE 4.23* 2.67* 3.99*   Estimated Creatinine Clearance: 9.6 mL/min (by C-G formula based on Cr of 3.99).  Recent Labs  10/07/15 0350  [facility] 16     Microbiology: Recent Results (from the past 720 hour(s))  MRSA PCR Screening     Status: None   Collection Time: 2015-10-25  5:16 PM  Result Value Ref Range Status   MRSA by PCR NEGATIVE NEGATIVE Final    Comment:        The GeneXpert MRSA Assay (FDA approved for NASAL specimens only), is one component of a comprehensive MRSA colonization surveillance program. It is not intended to diagnose MRSA infection nor to guide or monitor treatment for MRSA infections.   Culture, blood (routine x 2)     Status: None (Preliminary result)   Collection Time: 10/05/15 10:25 AM  Result Value Ref Range Status   Specimen Description BLOOD RIGHT HAND  Final   Special Requests AEROBIC BOTTLE ONLY 6CC  Final   Culture  Setup Time   Final    GRAM POSITIVE COCCI IN CLUSTERS AEROBIC BOTTLE ONLY CRITICAL RESULT CALLED TO, READ BACK BY AND VERIFIED WITH: L ROBBINS RN 1555 10/06/15 A BROWNING    Culture GRAM  POSITIVE COCCI  Final   Report Status PENDING  Incomplete  Culture, blood (routine x 2)     Status: None (Preliminary result)   Collection Time: 10/05/15 10:30 AM  Result Value Ref Range Status   Specimen Description BLOOD RIGHT ANTECUBITAL  Final   Special Requests IN PEDIATRIC BOTTLE 3CC  Final   Culture NO GROWTH 1 DAY  Final   Report Status PENDING  Incomplete    Medical History: Past Medical History  Diagnosis Date  . ANEMIA 09/25/2010  . DEPRESSION 12/02/2009  . ESSENTIAL HYPERTENSION 02/10/2010  . ASTHMA 12/02/2009  . COPD 10/03/2010  . PEPTIC ULCER DISEASE 10/23/2010  . NEPHROLITHIASIS 12/02/2009  . OSTEOARTHRITIS 01/27/2010  . Pyloric stenosis   . Peptic ulcer, unspecified site, unspecified as acute or chronic, without mention of hemorrhage, perforation, or obstruction   . Gastritis     Medications:  Scheduled:  . sodium chloride   Intravenous Once  . antiseptic oral rinse  7 mL Mouth Rinse QID  . budesonide  0.25 mg Nebulization Q12H  . chlorhexidine gluconate  15 mL Mouth Rinse BID  . darbepoetin (ARANESP) injection - NON-DIALYSIS  100 mcg Subcutaneous Q Thu-1800  . EPINEPHrine      . feeding supplement (PRO-STAT SUGAR FREE 64)  30 mL Per Tube Daily  . feeding supplement (VITAL AF 1.2 CAL)  1,000 mL Per Tube Q24H  . fentaNYL (SUBLIMAZE) injection  50  mcg Intravenous Once  . haloperidol lactate  5 mg Intravenous BID  . hydrocortisone sod succinate (SOLU-CORTEF) inj  50 mg Intravenous Q6H  . insulin aspart  0-9 Units Subcutaneous 6 times per day  . ipratropium-albuterol  3 mL Nebulization Q4H  . pantoprazole (PROTONIX) IV  40 mg Intravenous Q12H  . piperacillin-tazobactam (ZOSYN)  IV  2.25 g Intravenous Q8H  . [START ON 10/14/2015] vancomycin  500 mg Intravenous Q Fri-HD   Assessment: 80 year old female who [facility] 10/15/2015 for iliac artery stent placement. Procedure was done, no stent needed. Post-operatively she complained of  nausea and had hypotension. She became unresponsive and code blue was called 11/1.  WBC trending up 15.5>16.8,  PCT improving 2.80>>3.92> 4.01>3.4>2.87, Afeb.  LA 1.6, down from 16.5 on 11/1.   11/9 BCx: 1/2 GPCs  Pt received CRRT 11/4 - 11/7. SCr rapidly increased from 2.76 to 4.73 in 24h. Received HD 11/9 and receiving 4 hours of HD today.  Goal of Therapy:  Vancomycin trough level 15-20 mcg/ml  Plan:  - Vanc 500 mg post HD today - F/u HD schedule and vanc level prior to next HD to determine dosing - Zosyn 2.25 g IV q8h over 30 min - Monitor clinical progression, vanc levels, HD schedule, and cultures  Greggory Stallionristy Reyes, PharmD Clinical Pharmacy Resident Pager # [phone] 10/07/2015 1:02 PM

## 2015-10-07 NOTE — Procedures (Signed)
Chest Tube Insertion Procedure Note  Indications:  Clinically significant Pneumothorax  Pre-operative Diagnosis: Pneumothorax  Post-operative Diagnosis: Pneumothorax  Procedure Details  Informed consent was obtained for the procedure, including sedation.  Risks of lung perforation, hemorrhage, arrhythmia, and adverse drug reaction were discussed.   After sterile skin prep, using standard technique, a 20 French tube was placed in the left lateral 5th rib space.  Findings: Gush of air upon entry of pleural space.  Estimated Blood Loss:  less than 50 mL         Specimens:  None              Complications:  None; patient tolerated the procedure well.         Disposition: ICU - intubated and critically ill.         Condition: stable   Rutherford Guysahul Meliah Appleman, [place]PA - C  Pulmonary & Critical Care Medicine Pager: [phone]  or [phone] 10/07/2015, 1:42 AM   Attending Attestation: I was present for the entire procedure.

## 2015-10-07 NOTE — Progress Notes (Signed)
PULMONARY / CRITICAL CARE MEDICINE   Name: [name] Herman MRN: [id number] DOB: 1935-03-14    ADMISSION DATE:  26-Oct-2015 CONSULTATION DATE:  October 26, 2015  REFERRING MD :  Dr. Bonnielee Haff, IR  CHIEF COMPLAINT:  RP bleeding after IR procedure  INITIAL PRESENTATION: 80 year old female who presented to [facility] 2015/10/26 for iliac artery stent placement. Procedure was done, no stent needed. Post-operatively she complained of nausea and had hypotension. Distended abdomen. CT with with retroperitoneal blood, not actively bleeding per Dr. Bonnielee Haff. To ICU for PCCM management.   STUDIES:  CT (A) abd/pelvis 26-Oct-2023) >>> Large right retroperitoneal hemorrhage extending from the right inguinal region after angiogram. There is no active extravasation or bleeding. There is no pseudoaneurysm. Lower extremity arterial duplex 2023/10/26) No evidence of psuedoaneurysm in the right groin. A hematoma is noted measuring 3.55cm. CXR (11/3): No significant change in left lower lobe atelectasis or consolidation and mild bilateral perihilar atelectasis CXR (11/4): Endotracheal tube tip remains at the carina. Proximal repositioning of approximately 2 to 3 cm suggested. Remaining lines and tubes in stable position. Persistent bibasilar atelectasis. Mild infiltrate left lung base cannot be excluded. CXR (11/7): 1. Low endotracheal tube with tip 5 mm above the carina. 2. Mild improvement basilar lung aeration with residual retrocardiac atelectasis or pneumonia. CXR (11/8): Tube and catheter positions as described without pneumothorax. Note that the endotracheal tube tip is essentially at the carina. It may be prudent to consider withdrawing endotracheal tube 2-3 cm CXR (11/9): lines stable. Patchy bibasilar pulmonary infiltrates. KUB (11/9): coiled feeding tube CXR (11/11): 1. Bilateral chest tube placement with decreased bilateral pneumothoraces, question tiny persistent right apical component on the right. Complete  resolution on the left.  Endotracheal tube is now 1 cm from the carina, consider retraction of 1-2 cm.  Subcutaneous emphysema in both supraclavicular regions. Probable pneumomediastinum at the thoracic inlet. Linear air again seen in the upper abdomen, location uncertain.   SIGNIFICANT EVENTS: October 26, 2023 to IR for iliac stent, stent not needed, post op RP blood. Shock. To ICU 10/26/2023 coded pea 10 min , OR for evacuation hematoma and iliac repair  11/2- GI bleeding, renal called 11/3- less bleeding from OGT, no pressors 11/4- HD transitioned to CVVH 11/7- CRRT stopped 11/9- intermittent HD started 11/11- pneumothorax, chest tube placement  SUBJECTIVE:  S/p chest tube placement for pneumothorax  VITAL SIGNS: Temp:  [97.9 F (36.6 C)-99.3 F (37.4 C)] 97.9 F (36.6 C) (11/11 0306) Pulse Rate:  [62-109] 109 (11/11 0700) Resp:  [12-38] 38 (11/11 0700) BP: (87-188)/(38-103) 149/65 mmHg (11/11 0700) SpO2:  [97 %-100 %] 100 % (11/11 0700) Arterial Line BP: (68-240)/(30-88) 161/67 mmHg (11/11 0700) FiO2 (%):  [30 %] 30 % (11/11 0400) Weight:  [126 lb 15.8 oz (57.6 kg)] 126 lb 15.8 oz (57.6 kg) (11/11 0200) HEMODYNAMICS: CVP:  [5 mmHg-19 mmHg] 5 mmHg VENTILATOR SETTINGS: Vent Mode:  [-] PRVC FiO2 (%):  [30 %] 30 % Set Rate:  [12 bmp] 12 bmp Vt Set:  [400 mL] 400 mL PEEP:  [5 cmH20] 5 cmH20 Pressure Support:  [10 cmH20] 10 cmH20 Plateau Pressure:  [9 cmH20-21 cmH20] 9 cmH20 INTAKE / OUTPUT:  Intake/Output Summary (Last 24 hours) at 10/07/15 4098 Last data filed at 10/07/15 0600  Gross per 24 hour  Intake  921.4 ml  Output    159 ml  Net  762.4 ml    PHYSICAL EXAMINATION:  General:  Improved Restlessness, intubated Neuro: PERRLA HEENT: No thryomegaly, JVD Cardiovascular:  S1, S2, RRR Lungs: Clear to auscultation bilaterally, intubated, b/l chest tubes in place Abdomen:  Soft, + BS. Abdominal bandages in place Musculoskeletal:  Lower extremities without edema, LE pulses difficult  to palpate Skin:  Stable Necrotic appearing R toes, see photo below  Photos (10/03/05)      LABS:  CBC  Recent Labs Lab 10/05/15 0410 10/06/15 0515 10/07/15 0349  WBC 17.9* 15.5* 16.8*  HGB 8.4* 7.3* 6.9*  HCT 25.8* 21.3* 20.7*  PLT 92* 100* 139*   Coag's  Recent Labs Lab 10/01/15 0400 10/06/15 0830  INR 1.35 1.56*   BMET  Recent Labs Lab 10/05/15 0410 10/06/15 0515 10/07/15 0349  NA 141 137 139  K 5.5* 4.0 4.3  CL 107 102 104  CO2 23 25 21*  BUN 100* 55* 94*  CREATININE 4.23* 2.67* 3.99*  GLUCOSE 149* 100* 141*   Electrolytes  Recent Labs Lab 10/05/15 0410  10/06/15 0515 10/06/15 1500 10/06/15 1713 10/07/15 0349  CALCIUM 8.0*  --  7.7*  --   --  7.8*  MG 2.8*  < > 2.0 2.1  --  2.1  PHOS 3.7  < > 3.3  --  4.5 5.2*  < > = values in this interval not displayed. Sepsis Markers  Recent Labs Lab 10/01/15 0130  10/05/15 0410 10/06/15 1027 10/07/15 0349  LATICACIDVEN 1.6  --   --   --   --   PROCALCITON  --   < > 4.01 3.40 2.87  < > = values in this interval not displayed. ABG  Recent Labs Lab 10/06/15 0330 10/07/15 0023 10/07/15 0310  PHART 7.468* 7.215* 7.439  PCO2ART 33.7* 56.6* 29.7*  PO2ART 112* 263.0* 366*   Liver Enzymes  Recent Labs Lab 10/03/15 0350 10/04/15 0540 10/05/15 0410 10/06/15 0515 10/07/15 0349  AST 117* 127* 74*  --   --   ALT 103* 87* 74*  --   --   ALKPHOS 96 162* 182*  --   --   BILITOT 13.5* 16.9* 10.3*  --   --   ALBUMIN 2.5* 2.1* 2.1* 2.2* 2.1*   Cardiac Enzymes No results for input(s): TROPONINI, PROBNP in the last 168 hours. Glucose  Recent Labs Lab 10/06/15 0743 10/06/15 1136 10/06/15 1528 10/06/15 1913 10/06/15 2330 10/07/15 0311  GLUCAP 98 108* 96 110* 121* 139*    Imaging Dg Abd 1 View  10/06/2015  CLINICAL DATA:  Feeding tube placement EXAM: ABDOMEN - 1 VIEW COMPARISON:  10/05/2015 FLUOROSCOPY TIME:  1 minutes 46 seconds Images obtained: 1 CONTRAST:  15 mL Omnipaque 300 FINDINGS:  Small amount of contrast has been injected through the feeding tube. Feeding tube traverses stomach, duodenum and extends into proximal jejunum beyond ligament of Treitz. Injected contrast material opacifies a normal-appearing small bowel loop. IMPRESSION: Tip of feeding tube in proximal jejunum beyond ligament of Treitz. Electronically Signed   By: Ulyses SouthwardMark  Boles M.D.   On: 10/06/2015 14:45   Dg Chest Port 1 View  10/07/2015  CLINICAL DATA:  Chest tube placement. EXAM: PORTABLE CHEST 1 VIEW COMPARISON:  Earlier this day at 0028 hour FINDINGS: Placement of right-sided chest tube with near complete resolution of right pneumothorax, suspect tiny apical component persisting. Placement of left-sided chest tube with complete resolution of left pneumothorax. There is subcutaneous emphysema about the lateral left chest wall. Subcutaneous emphysema noted about supraclavicular regions, unchanged. Probable pneumomediastinum noted about the thoracic inlet. Endotracheal tube now 1 cm from the carina, recommend retraction of approximately 2 cm.  Enteric tube and bilateral internal jugular catheters are unchanged. Left perihilar atelectasis, more confluent than prior. Bibasilar atelectasis again seen. Linear air in the upper abdomen again seen. IMPRESSION: 1. Bilateral chest tube placement with decreased bilateral pneumothoraces, question tiny persistent right apical component on the right. Complete resolution on the left. 2. Endotracheal tube is now 1 cm from the carina, consider retraction of 1-2 cm. 3. Subcutaneous emphysema in both supraclavicular regions. Probable pneumomediastinum at the thoracic inlet. Linear air again seen in the upper abdomen, location uncertain. Electronically Signed   By: Rubye Oaks M.D.   On: 10/07/2015 02:19   Dg Chest Port 1 View  10/07/2015  CLINICAL DATA:  Decreased oxygen saturation.  Intubated. EXAM: PORTABLE CHEST 1 VIEW COMPARISON:  Radiographs obtained yesterday. FINDINGS: Bilateral  pneumothoraces, new. This is moderate on the left approximately 20% and small on the right. There is subcutaneous emphysema in both supraclavicular regions. There is linear air in the midline upper abdomen that is not definitively intraluminal. Endotracheal tube 2.1 cm from the carina. Enteric tube in place, tip below the diaphragm. Right internal jugular catheter tip in the SVC. Left internal jugular venous catheter in the region of the brachiocephalic vein. Cardiomediastinal contours are unchanged. Bilateral perihilar atelectasis, worsening in the left perihilar lung. There is no mediastinal shift. IMPRESSION: 1. New bilateral pneumothoraces, moderate on the left and small on the right. Development of supraclavicular subcutaneous emphysema. 2. Linear air in the upper abdomen is not definitively intraluminal, and may be tracking from the thorax given the pneumothoraces and supraclavicular air. CT could provide detailed characterization. 3. Increased left perihilar atelectasis. Critical Value/emergent results were called by telephone at the time of interpretation on 10/07/2015 at 12:41 am to PA Celine Mans, who verbally acknowledged these results. Electronically Signed   By: Rubye Oaks M.D.   On: 10/07/2015 00:46   Dg Chest Port 1 View  10/06/2015  CLINICAL DATA:  Respiratory failure, ventilated patient, hemorrhagic shock and cardiac arrest. EXAM: PORTABLE CHEST 1 VIEW COMPARISON:  Portable chest x-ray of October 05, 2015 FINDINGS: The lungs are adequately inflated. The interstitial markings are slightly less conspicuous today. Confluent infrahilar density persists bilaterally. There is a trace of pleural fluid on the left. The heart is normal in size. The pulmonary vascularity is prominent centrally without significant cephalization. The endotracheal tube tip projects 2 point 1 cm above the carina. The feeding tube tip projects below the inferior margin of the image. The dual-lumen right internal jugular venous  catheter tip projects over the midportion of the SVC. The left internal jugular venous catheter tip projects at the junction of the left internal jugular vein and left subclavian vein. Subtle kinks are noted proximally in 2 places in this catheter. There is a tubular structure that projects to the right of the endotracheal tube which likely lies external to the patient. There are degenerative changes of the right shoulder. IMPRESSION: *Stable coarse infrahilar subsegmental atelectasis and trace left pleural effusion. Stable mild interstitial prominence. The support tubes are in reasonable position. Electronically Signed   By: David  [place] M.D.   On: 10/06/2015 09:32   Dg Vangie Bicker G Tube Plc W/fl-no Rad  10/06/2015  CLINICAL DATA:  NASO G TUBE PLACEMENT WITH FLUORO Fluoroscopy was utilized by the requesting physician.  No radiographic interpretation.     ASSESSMENT / PLAN:  PULMONARY A: COPD without acute exacerbation Acute resp failure, hypoxia High risk TRALI High risk uncompensated acidosis  pulm edema concerns  ABG: pH 7.439, CO2  29.7 P:   Fentanyl, wean as tolerated Precedex Intermittent HD resumed 11/09 CXR in am  CARDIOVASCULAR CVL LIJ sheath 11/1 >>>  ALINE left fem 11/1>>> A:  Hemorrhagic shock resolved Blue toe syndrome PAD H/o HTN Trop leak, s/p cpr Hgb 6.9 P:  Tolerating being off pressors.  Transfusion threshold hgb 7.0. Transfuse 1 unit pRBCs during HD today DIC improved  RENAL A:   CKD Acute renal failure, ATN, post arrest , shock At risk abdo compartment, bladder pressure high but no elevated vent pressures or CVP No plan for repeat OR.  Hypernatremia, improved Hyperkalemia, improved Hypophos, improved Cr 2.67 P:   Daily BMET Replete electrolytes as needed Intermittent HD per renal  GASTROINTESTINAL A:   H/o PUD Active GI bleed, likely all related to coags, fibrinogen, plat etc Improved bleeding Abdominal pressure improved to 15 on 11/8 P:   TF  resumed 11/7, high residuals. Transitioned to CORTRAK feeds but unable to tolerate so TF dc'd 11/9 PPI bid  HEMATOLOGIC Total: PRBC 16 FFP 3 Cryo 11 A:   Acute retroperitoneal hemorrhage s/p IR arteriogram  Consumptive / dilutional coagulapthy DIC improved with cryo Hgb 6.9 P:  CBC to q24 Follow CBC, platelets, coags  INFECTIOUS A:   Afebrile overnight.  WBC 16.8.  PCT decreasing to 2.87.   P:   Blood cx 11/9>> GPC  Vanc 11/9>> Zosyn 11/9>>  ENDOCRINE A:   Chronic prednisone for polymyalgia rheumatica GI bleed P:   Hydrocortisone to 50 mg q12 Follow glucose on Bmet SSI  NEUROLOGIC A:   Acute metabolic encephalopathy up and down R/o anoxia, post arrest P:   RASS goal: 0 Monitor closely Fentanyl discontinued 11/9 Avoid benzo  FAMILY  - Updates: family updated in full 11/6 - Inter-disciplinary family meet or Palliative Care meeting due by:  11/7  Monte Zinni M. Nadine Counts, DO PGY-2, [facility] 10/07/2015, 7:22 AM

## 2015-10-07 NOTE — Progress Notes (Signed)
CRITICAL VALUE ALERT  Critical value received:  Hgb 6.9  Date of notification:  10/07/2015  Time of notification:  05:10  Critical value read back:Yes.    Nurse who received alert:  BShepherd, RN  MD notified (1st page):  Dr Leonides Schanzorsey  Time of first page:  05:11  MD notified (2nd page):  Time of second page:  Responding MD:  Dr Leonides Schanzorsey  Time MD responded:  05:12

## 2015-10-07 NOTE — Progress Notes (Signed)
eLink Physician-Brief Progress Note Patient Name: Orma FlamingMartha H Brunke DOB: 01/26/1935 MRN: [id number]   Date of Service  10/07/2015  HPI/Events of Note  ABG on PRVC  12/TV  450 = 7.51/33.7/104.  eICU Interventions  Will decrease PRVC rate to 10. Check ABG in AM.      Intervention Category Major Interventions: Respiratory failure - evaluation and management  Vondell Babers [place] 10/07/2015, 11:55 PM

## 2015-10-07 NOTE — Progress Notes (Signed)
Hgb dropped to 6.9. Renal added Darbe previously.  Day team will contact nephrology concerning transfusing 1u pRBCs during dialysis today. Currently HDS.   Joanna Puffrystal S. Dorsey, MD [facility] Resident  10/07/2015, 5:14 AM

## 2015-10-07 NOTE — Procedures (Signed)
Patient was seen on dialysis and the procedure was supervised.  BFR 300  Via vascath BP is  136/60.   Patient appears to be tolerating treatment well- some concern about blood pressure being too labile for IHD- so far so good- would rather not take the step back to CRRT unless absolutely necessary   Marites Nath A 10/07/2015

## 2015-10-07 NOTE — Progress Notes (Signed)
Patient ID: [name] Herman, female   DOB: 09/17/1935, 80 y.o.   MRN: [id number] [name] Herman is still on ventilator support after failing to wean. She did suffer bilateral pneumothoraces and now has re-expanded lungs after bilateral chest tube placement. She continues to be oliguric and is currently undergoing HD at the bedside. Her Hb has slowly dropped to 6.9 and she will received blood during HD. 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 The overall color and skin of the remainder of the foot looks better with less mottling. Her INR and bilirubin are now abnormal suggesting liver dysfunction. Efforts are continuing for overall support.

## 2015-10-07 NOTE — Progress Notes (Signed)
Pt vent settings were set different than order. The was a note from [place], Sharyn Lull that Dr. Vaughan Browner increased VT to 450, but no order to match. VT set @ 410ms (charted last by RT @ 405m) EPIC order is for 8cc =36040m Called [facility] to clarify orders. Dr. SomEmmit Alexandersdered VT: 450, RR: 12, Peep: 5, FiO2: 30%. Also, sputum sample kit was laid on vent. I questioned Dr. SomEmmit Alexanderse was unsure if this was needed but entered a order and sample was sent. RT will draw ABG @ 2300.

## 2015-10-07 NOTE — Progress Notes (Deleted)
ABG    Component Value Date/Time   PHART 7.439 10/07/2015 0310   PCO2ART 29.7* 10/07/2015 0310   PO2ART 366* 10/07/2015 0310   HCO3 19.8* 10/07/2015 0310   TCO2 20.7 10/07/2015 0310   ACIDBASEDEF 3.7* 10/07/2015 0310   O2SAT 99.9 10/07/2015 0310    Based on abg, will decrease FiO2 to 40%. Will get a repeat abg in 1 hour.  Joanna Puffrystal S. Dorsey, MD [facility] Resident  10/07/2015, 5:20 AM

## 2015-10-07 NOTE — Procedures (Signed)
Chest Tube Insertion Procedure Note  Indications:  Clinically significant Pneumothorax  Pre-operative Diagnosis: Pneumothorax  Post-operative Diagnosis: Pneumothorax  Procedure Details  Informed consent was obtained for the procedure, including sedation.  Risks of lung perforation, hemorrhage, arrhythmia, and adverse drug reaction were discussed.   After sterile skin prep, using standard technique, a 20 French tube was placed in the right lateral 5 rib space.  Findings: None  Estimated Blood Loss:  Minimal         Specimens:  None              Complications:  None; patient tolerated the procedure well.         Disposition: ICU - intubated and hemodynamically stable.         Condition: stable  Attending Attestation: I performed the procedure.  Coralyn HellingVineet Sione Baumgarten, MD [facility] 10/07/2015, 1:41 AM Pager:  [phone] After 3pm call: [phone]

## 2015-10-08 ENCOUNTER — Encounter (HOSPITAL_COMMUNITY): Payer: Self-pay

## 2015-10-08 LAB — BLOOD GAS, ARTERIAL
ACID-BASE EXCESS: 1.5 mmol/L (ref 0.0–2.0)
Bicarbonate: 24.3 mEq/L — ABNORMAL HIGH (ref 20.0–24.0)
Drawn by: 36496
FIO2: 0.3
LHR: 10 {breaths}/min
O2 SAT: 99 %
PATIENT TEMPERATURE: 100.1
PEEP: 5 cmH2O
TCO2: 25.2 mmol/L (ref 0–100)
VT: 450 mL
pCO2 arterial: 31.5 mmHg — ABNORMAL LOW (ref 35.0–45.0)
pH, Arterial: 7.502 — ABNORMAL HIGH (ref 7.350–7.450)
pO2, Arterial: 139 mmHg — ABNORMAL HIGH (ref 80.0–100.0)

## 2015-10-08 LAB — RENAL FUNCTION PANEL
ALBUMIN: 2 g/dL — AB (ref 3.5–5.0)
ANION GAP: 12 (ref 5–15)
BUN: 52 mg/dL — ABNORMAL HIGH (ref 6–20)
CHLORIDE: 103 mmol/L (ref 101–111)
CO2: 24 mmol/L (ref 22–32)
Calcium: 7.8 mg/dL — ABNORMAL LOW (ref 8.9–10.3)
Creatinine, Ser: 2.72 mg/dL — ABNORMAL HIGH (ref 0.44–1.00)
GFR calc Af Amer: 18 mL/min — ABNORMAL LOW (ref >60)
GFR, EST NON AFRICAN AMERICAN: 16 mL/min — AB (ref >60)
GLUCOSE: 166 mg/dL — AB (ref 65–99)
PHOSPHORUS: 2.6 mg/dL (ref 2.5–4.6)
POTASSIUM: 3.6 mmol/L (ref 3.5–5.1)
Sodium: 139 mmol/L (ref 135–145)

## 2015-10-08 LAB — CBC
HCT: 25.9 % — ABNORMAL LOW (ref 36.0–46.0)
Hemoglobin: 8.8 g/dL — ABNORMAL LOW (ref 12.0–15.0)
MCH: 28.6 pg (ref 26.0–34.0)
MCHC: 34 g/dL (ref 30.0–36.0)
MCV: 84.1 fL (ref 78.0–100.0)
Platelets: 149 10*3/uL — ABNORMAL LOW (ref 150–400)
RBC: 3.08 MIL/uL — ABNORMAL LOW (ref 3.87–5.11)
RDW: 17.7 % — AB (ref 11.5–15.5)
WBC: 20.8 10*3/uL — AB (ref 4.0–10.5)

## 2015-10-08 LAB — TYPE AND SCREEN
ABO/RH(D): B POS
Antibody Screen: NEGATIVE
Unit division: 0

## 2015-10-08 LAB — PHOSPHORUS
Phosphorus: 2.6 mg/dL (ref 2.5–4.6)
Phosphorus: 2.6 mg/dL (ref 2.5–4.6)

## 2015-10-08 LAB — CULTURE, BLOOD (ROUTINE X 2)

## 2015-10-08 LAB — MAGNESIUM
Magnesium: 1.9 mg/dL (ref 1.7–2.4)
Magnesium: 1.9 mg/dL (ref 1.7–2.4)

## 2015-10-08 LAB — GLUCOSE, CAPILLARY
Glucose-Capillary: 164 mg/dL — ABNORMAL HIGH (ref 65–99)
Glucose-Capillary: 146 mg/dL — ABNORMAL HIGH (ref 65–99)
Glucose-Capillary: 138 mg/dL — ABNORMAL HIGH (ref 65–99)
Glucose-Capillary: 139 mg/dL — ABNORMAL HIGH (ref 65–99)

## 2015-10-08 LAB — PROCALCITONIN: Procalcitonin: 3.45 ng/mL

## 2015-10-08 MED ORDER — IPRATROPIUM-ALBUTEROL 0.5-2.5 (3) MG/3ML IN SOLN
3.0000 mL | RESPIRATORY_TRACT | Status: DC | PRN
  Administered 2015-10-11: 3 mL via RESPIRATORY_TRACT

## 2015-10-08 MED ORDER — PNEUMOCOCCAL VAC POLYVALENT 25 MCG/0.5ML IJ INJ
0.5000 mL | INJECTION | INTRAMUSCULAR | Status: DC
  Filled 2015-10-08: qty 0.5

## 2015-10-08 MED ORDER — VANCOMYCIN HCL 500 MG IV SOLR
500.0000 mg | Freq: Once | INTRAVENOUS | Status: AC
  Administered 2015-10-08: 500 mg via INTRAVENOUS
  Filled 2015-10-08: qty 500

## 2015-10-08 MED ORDER — IPRATROPIUM-ALBUTEROL 0.5-2.5 (3) MG/3ML IN SOLN
3.0000 mL | Freq: Three times a day (TID) | RESPIRATORY_TRACT | Status: DC
  Administered 2015-10-08 – 2015-10-12 (×11): 3 mL via RESPIRATORY_TRACT
  Filled 2015-10-08 (×8): qty 3
  Filled 2015-10-08: qty 36
  Filled 2015-10-08 (×3): qty 3

## 2015-10-08 NOTE — Progress Notes (Signed)
PULMONARY / CRITICAL CARE MEDICINE   Name: [name] Herman MRN: [id number] DOB: 05-21-1935    ADMISSION DATE:  2015/10/25 CONSULTATION DATE:  10/25/2015  REFERRING MD :  Dr. Bonnielee Haff, IR  CHIEF COMPLAINT:  RP bleeding after IR procedure  INITIAL PRESENTATION: 80 year old female who presented to [facility] Interventional [facility]iology 2015/10/25 for iliac artery stent placement. Procedure was done, no stent needed. Post-operatively she complained of nausea and had hypotension. Distended abdomen. CT with with retroperitoneal blood, not actively bleeding per Dr. Bonnielee Haff. To ICU for PCCM management.   STUDIES:  CT (A) abd/pelvis 10-25-2023) >>> Large right retroperitoneal hemorrhage extending from the right inguinal region after angiogram. There is no active extravasation or bleeding. There is no pseudoaneurysm. Lower extremity arterial duplex 2023/10/25) No evidence of psuedoaneurysm in the right groin. A hematoma is noted measuring 3.55cm. CXR (11/3): No significant change in left lower lobe atelectasis or consolidation and mild bilateral perihilar atelectasis CXR (11/4): Endotracheal tube tip remains at the carina. Proximal repositioning of approximately 2 to 3 cm suggested. Remaining lines and tubes in stable position. Persistent bibasilar atelectasis. Mild infiltrate left lung base cannot be excluded. CXR (11/7): 1. Low endotracheal tube with tip 5 mm above the carina. 2. Mild improvement basilar lung aeration with residual retrocardiac atelectasis or pneumonia. CXR (11/8): Tube and catheter positions as described without pneumothorax. Note that the endotracheal tube tip is essentially at the carina. It may be prudent to consider withdrawing endotracheal tube 2-3 cm CXR (11/9): lines stable. Patchy bibasilar pulmonary infiltrates. KUB (11/9): coiled feeding tube CXR (11/11): 1. Bilateral chest tube placement with decreased bilateral pneumothoraces, question tiny persistent right apical component on the right. Complete  resolution on the left.  Endotracheal tube is now 1 cm from the carina, consider retraction of 1-2 cm.  Subcutaneous emphysema in both supraclavicular regions. Probable pneumomediastinum at the thoracic inlet. Linear air again seen in the upper abdomen, location uncertain.   SIGNIFICANT EVENTS: 2023-10-25 to IR for iliac stent, stent not needed, post op RP blood. Shock. To ICU Oct 25, 2023 coded pea 10 min , OR for evacuation hematoma and iliac repair  11/2- GI bleeding, renal called 11/3- less bleeding from OGT, no pressors 11/4- HD transitioned to CVVH 11/7- CRRT stopped 11/9- intermittent HD started 11/11- pneumothorax, chest tube placement 11/12 toe ischemia as below.  SUBJECTIVE:  S/p chest tube placement for pneumothorax  VITAL SIGNS: Temp:  [97.5 F (36.4 C)-100.1 F (37.8 C)] 100.1 F (37.8 C) (11/12 0319) Pulse Rate:  [82-124] 103 (11/12 0511) Resp:  [15-42] 17 (11/12 0200) BP: (100-176)/(39-92) 176/69 mmHg (11/12 0511) SpO2:  [100 %] 100 % (11/12 0511) Arterial Line BP: (73-161)/(28-67) 91/37 mmHg (11/12 0200) FiO2 (%):  [30 %] 30 % (11/12 0511) Weight:  [129 lb 3 oz (58.6 kg)-136 lb 7.4 oz (61.9 kg)] 129 lb 3 oz (58.6 kg) (11/12 0500) HEMODYNAMICS: CVP:  [7 mmHg-13 mmHg] 7 mmHg VENTILATOR SETTINGS: Vent Mode:  [-] PRVC FiO2 (%):  [30 %] 30 % Set Rate:  [10 bmp-12 bmp] 10 bmp Vt Set:  [400 mL-450 mL] 420 mL PEEP:  [5 cmH20] 5 cmH20 Plateau Pressure:  [15 cmH20-21 cmH20] 19 cmH20 INTAKE / OUTPUT:  Intake/Output Summary (Last 24 hours) at 10/08/15 0604 Last data filed at 10/08/15 0233  Gross per 24 hour  Intake 2090.31 ml  Output   1416 ml  Net 674.31 ml    PHYSICAL EXAMINATION:  General:  Improved Restlessness, intubated Neuro: PERRLA, +scleral icterus  HEENT: No  thryomegaly, JVD Cardiovascular:  S1, S2, RRR Lungs: Clear to auscultation bilaterally, intubated, b/l chest tubes in place Abdomen:  Soft, + hypoactive BS. Abdominal bandages in place Musculoskeletal:   Lower extremities without edema, LE pulses difficult to palpate Skin:  Stable Necrotic appearing R toes, see photo below  Photos (10/03/05)      LABS:  CBC  Recent Labs Lab 10/07/15 0349 10/07/15 1535 10/08/15 0532  WBC 16.8* 25.7* 20.8*  HGB 6.9* 9.7* 8.8*  HCT 20.7* 27.8* 25.9*  PLT 139* 151 149*   Coag's  Recent Labs Lab 10/06/15 0830  INR 1.56*   BMET  Recent Labs Lab 10/05/15 0410 10/06/15 0515 10/07/15 0349  NA 141 137 139  K 5.5* 4.0 4.3  CL 107 102 104  CO2 23 25 21*  BUN 100* 55* 94*  CREATININE 4.23* 2.67* 3.99*  GLUCOSE 149* 100* 141*   Electrolytes  Recent Labs Lab 10/05/15 0410  10/06/15 0515 10/06/15 1500 10/06/15 1713 10/07/15 0349 10/07/15 1535  CALCIUM 8.0*  --  7.7*  --   --  7.8*  --   MG 2.8*  < > 2.0 2.1  --  2.1 1.8  PHOS 3.7  < > 3.3  --  4.5 5.2* 2.4*  < > = values in this interval not displayed. Sepsis Markers  Recent Labs Lab 10/05/15 0410 10/06/15 1027 10/07/15 0349  PROCALCITON 4.01 3.40 2.87   ABG  Recent Labs Lab 10/07/15 0310 10/07/15 2259 10/08/15 0335  PHART 7.439 7.513* 7.502*  PCO2ART 29.7* 33.7* 31.5*  PO2ART 366* 104.0* 139*   Liver Enzymes  Recent Labs Lab 10/03/15 0350 10/04/15 0540 10/05/15 0410 10/06/15 0515 10/07/15 0349  AST 117* 127* 74*  --   --   ALT 103* 87* 74*  --   --   ALKPHOS 96 162* 182*  --   --   BILITOT 13.5* 16.9* 10.3*  --   --   ALBUMIN 2.5* 2.1* 2.1* 2.2* 2.1*   Cardiac Enzymes No results for input(s): TROPONINI, PROBNP in the last 168 hours. Glucose  Recent Labs Lab 10/07/15 0801 10/07/15 1149 10/07/15 1543 10/07/15 1935 10/07/15 2321 10/08/15 0318  GLUCAP 118* 110* 91 127* 151* 164*    Imaging Dg Chest Port 1 View  10/07/2015  CLINICAL DATA:  Respiratory failure EXAM: PORTABLE CHEST 1 VIEW COMPARISON:  Study obtained earlier in the day FINDINGS: Endotracheal tube tip is 8 mm above the carina. Central catheter tip placed from the right is in the  superior vena cava. Cordis placed on the left has its tip in the left innominate vein. Feeding tube tip extends below the diaphragm. Bilateral chest tubes are present. No pneumothorax apparent. The lungs are now clear; the area of focal consolidation in the left upper lobe seen earlier in the day has resolved. Heart size and pulmonary vascularity are normal. No adenopathy. There are recent rib fractures bilaterally, essentially unchanged. IMPRESSION: Tube and catheter positions as described without pneumothorax. Note that the endotracheal tube tip is near the carina; it may be prudent to consider withdrawing endotracheal tube 1 to 2 cm. Lungs now clear. No change in cardiac silhouette. Note that there is less soft tissue air on the left side compared to earlier in the day. Electronically Signed   By: Bretta Bang III M.D.   On: 10/07/2015 11:21   ASSESSMENT / PLAN:  PULMONARY A: COPD without acute exacerbation Acute resp failure, hypoxia High risk TRALI High risk uncompensated acidosis  pulm edema concerns  ABG: pH 7.502, CO2 31.5 P:   Fentanyl, wean as able Precedex Intermittent HD resumed 11/09 CXR ordered  CARDIOVASCULAR CVL LIJ sheath 11/1 >>>  ALINE left fem 11/1>>> A:  Hemorrhagic shock resolved Blue toe syndrome PAD H/o HTN Trop leak, s/p cpr Hgb 8.8 P:  Tolerating being off pressors, BP tenuous  Transfusion threshold hgb 7.0. DIC improved  RENAL A:   CKD Acute renal failure, ATN, post arrest , shock At risk abdo compartment, bladder pressure high but no elevated vent pressures or CVP No plan for repeat OR.  Hypernatremia, improved Hyperkalemia, improved Hypophos, improved Cr stable 2.72 P:   Daily BMET Replete electrolytes as needed Intermittent HD per renal  GASTROINTESTINAL A:   H/o PUD Active GI bleed, likely all related to coags, fibrinogen, plat etc Improved bleeding Abdominal pressure improved to 15 on 11/8 P:   On trickle feeds, advancing rate  as tolerated PPI bid  HEMATOLOGIC Total: PRBC 17 FFP 3 Cryo 11 A:   Acute retroperitoneal hemorrhage s/p IR arteriogram  Consumptive / dilutional coagulapthy DIC improved with cryo Hgb 8.8 P:  CBC to q24 Follow CBC, platelets, coags  INFECTIOUS A:   Low grade fever overnight 100.38F.  WBC 20.8.  PCT increasing 3.45  P:   Blood cx 11/9>> GPC  Vanc 11/9>> Zosyn 11/9>>  ENDOCRINE A:   Chronic prednisone for polymyalgia rheumatica GI bleed P:   Hydrocortisone to 50 mg q6 Follow glucose on Bmet SSI  NEUROLOGIC A:   Acute metabolic encephalopathy up and down R/o anoxia, post arrest P:   RASS goal: 0 Monitor closely Fentanyl discontinued 11/9 Avoid benzo  FAMILY  - Updates: family updated in full 11/11 - Inter-disciplinary family meet or Palliative Care meeting due by:  11/7  Ashly M. Nadine CountsGottschalk, DO PGY-2, [facility] 10/08/2015, 6:04 AM  Attending Note:  80 year old female with RP bleeding after iliac artery stent placement who developed respiratory failure due to pulmonary edema post transfusion.  Subsequently developed a PTX and chest tube was placed.  Patient overall is now hemodynamically stable.  CT has not leak.  Continues on full suction.  Continue with stress dose steroids.  GPC in blood, will continue with vanc/zosyn until speciation.  Hold off weaning for now given active infection and necrotizing toes.  Will allow for relatively high pressure hopefully to improve perfusion now that RP bleed is essentially under control.  The patient is critically ill with multiple organ systems failure and requires high complexity decision making for assessment and support, frequent evaluation and titration of therapies, application of advanced monitoring technologies and extensive interpretation of multiple databases.   Critical Care Time devoted to patient care services described in this note is  35  Minutes. This time reflects time of care of this signee Dr  Koren BoundWesam Eilyn Polack. This critical care time does not reflect procedure time, or teaching time or supervisory time of PA/NP/Med student/Med Resident etc but could involve care discussion time.  Alyson ReedyWesam G. Kollin Udell, M.D. [facility] Medicine. Pager: [phone]. After hours pager: [phone].

## 2015-10-08 NOTE — Progress Notes (Signed)
Subjective:  Patient tolerated IHD yesterday well- removed 1350- BP overall seems higher- "unmeasured UOP times 3" Objective Vital signs in last 24 hours: Filed Vitals:   10/08/15 0500 10/08/15 0511 10/08/15 0600 10/08/15 0700  BP: 143/54 176/69 103/45 125/48  Pulse: 93 103 82 86  Temp:      TempSrc:      Resp: Height:      Weight: 58.6 kg (129 lb 3 oz)     SpO2: 100% 100% 100% 100%   Weight change: 4.3 kg (9 lb 7.7 oz)  Intake/Output Summary (Last 24 hours) at 10/08/15 0757 Last data filed at 10/08/15 0700  Gross per 24 hour  Intake 2447.21 ml  Output   1475 ml  Net 972.21 ml    Assessment/ Plan: Pt is a 80 y.o. yo female who was admitted on 10-04-2015 with hemorrhagic shock/PEA arrest after PAD intervention with resultant AKI - prolonged hospital course Assessment/Plan: 1. AKI- due to contrast nephropathy and ATN due to hemorrhagic shock and PEA arrest- had been CRRT dependent from 11/4-11/7, then IHD on 11/9 and 11/11.  Baseline creatinine in September was 1.07.  volume seems OK with FIO2 of 30 and CVP in single digits.  Now CK is a little up- maybe an element of rhabdo.  Unmeasured UOP times 3- will place foley to quantify - no indications for dialysis today, likely will need Monday but if making urine maybe trying to recover renal function ?? 2. HTN/vol=- CVP down and FIo2 30% - Feeling like low BP has to do with sedation- it does get higher at times- no pressors-tolerating minimal UF- is third spacing 3. Anemia- after hemorrhagic shock- RPH- hgb dropping slowly- I will add darbe but likely will need transfusion- s/p one unit given 11/11 4. Elytes- hyperkalemia- corrected with HD 5. VDRF/chest tubes- per CCM 6. Fever- per CCM- now on zosyn and vanc, possibly due to hematoma- wbc up today, fever curve is decreasing   Anhad Sheeley A    Labs: Basic Metabolic Panel:  Recent Labs Lab 10/06/15 0515  10/07/15 0349 10/07/15 1535 10/08/15 0533  NA 137  --   139  --  139  K 4.0  --  4.3  --  3.6  CL 102  --  104  --  103  CO2 25  --  21*  --  24  GLUCOSE 100*  --  141*  --  166*  BUN 55*  --  94*  --  52*  CREATININE 2.67*  --  3.99*  --  2.72*  CALCIUM 7.7*  --  7.8*  --  7.8*  PHOS 3.3  < > 5.2* 2.4* 2.6  < > = values in this interval not displayed. Liver Function Tests:  Recent Labs Lab 10/03/15 0350 10/04/15 0540 10/05/15 0410 10/06/15 0515 10/07/15 0349 10/08/15 0533  AST 117* 127* 74*  --   --   --   ALT 103* 87* 74*  --   --   --   ALKPHOS 96 162* 182*  --   --   --   BILITOT 13.5* 16.9* 10.3*  --   --   --   PROT 5.7* 5.0* 5.3*  --   --   --   ALBUMIN 2.5* 2.1* 2.1* 2.2* 2.1* 2.0*   No results for input(s): LIPASE, AMYLASE in the last 168 hours. No results for input(s): AMMONIA in the last 168 hours. CBC:  Recent Labs Lab 10/05/15 0410 10/06/15 0515  10/07/15 0349 10/07/15 1535 10/08/15 0532  WBC 17.9* 15.5* 16.8* 25.7* 20.8*  HGB 8.4* 7.3* 6.9* 9.7* 8.8*  HCT 25.8* 21.3* 20.7* 27.8* 25.9*  MCV 88.1 87.7 87.0 82.5 84.1  PLT 92* 100* 139* 151 149*   Cardiac Enzymes: No results for input(s): CKTOTAL, CKMB, CKMBINDEX, TROPONINI in the last 168 hours. CBG:  Recent Labs Lab 10/07/15 1543 10/07/15 1935 10/07/15 2321 10/08/15 0318 10/08/15 0734  GLUCAP 91 127* 151* 164* 146*    Iron Studies: No results for input(s): IRON, TIBC, TRANSFERRIN, FERRITIN in the last 72 hours. Studies/Results: Dg Abd 1 View  10/06/2015  CLINICAL DATA:  Feeding tube placement EXAM: ABDOMEN - 1 VIEW COMPARISON:  10/05/2015 FLUOROSCOPY TIME:  1 minutes 46 seconds Images obtained: 1 CONTRAST:  15 mL Omnipaque 300 FINDINGS: Small amount of contrast has been injected through the feeding tube. Feeding tube traverses stomach, duodenum and extends into proximal jejunum beyond ligament of Treitz. Injected contrast material opacifies a normal-appearing small bowel loop. IMPRESSION: Tip of feeding tube in proximal jejunum beyond ligament of  Treitz. Electronically Signed   By: Ulyses Southward M.D.   On: 10/06/2015 14:45   Dg Chest Port 1 View  10/07/2015  CLINICAL DATA:  Respiratory failure EXAM: PORTABLE CHEST 1 VIEW COMPARISON:  Study obtained earlier in the day FINDINGS: Endotracheal tube tip is 8 mm above the carina. Central catheter tip placed from the right is in the superior vena cava. Cordis placed on the left has its tip in the left innominate vein. Feeding tube tip extends below the diaphragm. Bilateral chest tubes are present. No pneumothorax apparent. The lungs are now clear; the area of focal consolidation in the left upper lobe seen earlier in the day has resolved. Heart size and pulmonary vascularity are normal. No adenopathy. There are recent rib fractures bilaterally, essentially unchanged. IMPRESSION: Tube and catheter positions as described without pneumothorax. Note that the endotracheal tube tip is near the carina; it may be prudent to consider withdrawing endotracheal tube 1 to 2 cm. Lungs now clear. No change in cardiac silhouette. Note that there is less soft tissue air on the left side compared to earlier in the day. Electronically Signed   By: Bretta Bang III M.D.   On: 10/07/2015 11:21   Dg Chest Port 1 View  10/07/2015  CLINICAL DATA:  Chest tube placement. EXAM: PORTABLE CHEST 1 VIEW COMPARISON:  Earlier this day at 0028 hour FINDINGS: Placement of right-sided chest tube with near complete resolution of right pneumothorax, suspect tiny apical component persisting. Placement of left-sided chest tube with complete resolution of left pneumothorax. There is subcutaneous emphysema about the lateral left chest wall. Subcutaneous emphysema noted about supraclavicular regions, unchanged. Probable pneumomediastinum noted about the thoracic inlet. Endotracheal tube now 1 cm from the carina, recommend retraction of approximately 2 cm. Enteric tube and bilateral internal jugular catheters are unchanged. Left perihilar  atelectasis, more confluent than prior. Bibasilar atelectasis again seen. Linear air in the upper abdomen again seen. IMPRESSION: 1. Bilateral chest tube placement with decreased bilateral pneumothoraces, question tiny persistent right apical component on the right. Complete resolution on the left. 2. Endotracheal tube is now 1 cm from the carina, consider retraction of 1-2 cm. 3. Subcutaneous emphysema in both supraclavicular regions. Probable pneumomediastinum at the thoracic inlet. Linear air again seen in the upper abdomen, location uncertain. Electronically Signed   By: Rubye Oaks M.D.   On: 10/07/2015 02:19   Dg Chest Port 1 View  10/07/2015  CLINICAL DATA:  Decreased oxygen saturation.  Intubated. EXAM: PORTABLE CHEST 1 VIEW COMPARISON:  Radiographs obtained yesterday. FINDINGS: Bilateral pneumothoraces, new. This is moderate on the left approximately 20% and small on the right. There is subcutaneous emphysema in both supraclavicular regions. There is linear air in the midline upper abdomen that is not definitively intraluminal. Endotracheal tube 2.1 cm from the carina. Enteric tube in place, tip below the diaphragm. Right internal jugular catheter tip in the SVC. Left internal jugular venous catheter in the region of the brachiocephalic vein. Cardiomediastinal contours are unchanged. Bilateral perihilar atelectasis, worsening in the left perihilar lung. There is no mediastinal shift. IMPRESSION: 1. New bilateral pneumothoraces, moderate on the left and small on the right. Development of supraclavicular subcutaneous emphysema. 2. Linear air in the upper abdomen is not definitively intraluminal, and may be tracking from the thorax given the pneumothoraces and supraclavicular air. CT could provide detailed characterization. 3. Increased left perihilar atelectasis. Critical Value/emergent results were called by telephone at the time of interpretation on 10/07/2015 at 12:41 am to PA Celine Mansesai, who verbally  acknowledged these results. Electronically Signed   By: Rubye OaksMelanie  Ehinger M.D.   On: 10/07/2015 00:46   Dg Chest Port 1 View  10/06/2015  CLINICAL DATA:  Respiratory failure, ventilated patient, hemorrhagic shock and cardiac arrest. EXAM: PORTABLE CHEST 1 VIEW COMPARISON:  Portable chest x-ray of October 05, 2015 FINDINGS: The lungs are adequately inflated. The interstitial markings are slightly less conspicuous today. Confluent infrahilar density persists bilaterally. There is a trace of pleural fluid on the left. The heart is normal in size. The pulmonary vascularity is prominent centrally without significant cephalization. The endotracheal tube tip projects 2 point 1 cm above the carina. The feeding tube tip projects below the inferior margin of the image. The dual-lumen right internal jugular venous catheter tip projects over the midportion of the SVC. The left internal jugular venous catheter tip projects at the junction of the left internal jugular vein and left subclavian vein. Subtle kinks are noted proximally in 2 places in this catheter. There is a tubular structure that projects to the right of the endotracheal tube which likely lies external to the patient. There are degenerative changes of the right shoulder. IMPRESSION: *Stable coarse infrahilar subsegmental atelectasis and trace left pleural effusion. Stable mild interstitial prominence. The support tubes are in reasonable position. Electronically Signed   By: David  Swazil[place]Jordan M.D.   On: 10/06/2015 09:32   Dg Vangie BickerNaso G Tube Plc W/fl-no Rad  10/06/2015  CLINICAL DATA:  NASO G TUBE PLACEMENT WITH FLUORO Fluoroscopy was utilized by the requesting physician.  No radiographic interpretation.   Medications: Infusions: . sodium chloride 10 mL/hr at 10/07/15 2000  . dexmedetomidine 1 mcg/kg/hr (10/08/15 0720)  . fentaNYL infusion INTRAVENOUS Stopped (10/08/15 0729)    Scheduled Medications: . sodium chloride   Intravenous Once  . antiseptic oral  rinse  7 mL Mouth Rinse QID  . budesonide  0.25 mg Nebulization Q12H  . chlorhexidine gluconate  15 mL Mouth Rinse BID  . darbepoetin (ARANESP) injection - NON-DIALYSIS  100 mcg Subcutaneous Q Thu-1800  . feeding supplement (PRO-STAT SUGAR FREE 64)  30 mL Per Tube Daily  . feeding supplement (VITAL AF 1.2 CAL)  1,000 mL Per Tube Q24H  . fentaNYL (SUBLIMAZE) injection  50 mcg Intravenous Once  . haloperidol lactate  5 mg Intravenous BID  . hydrocortisone sod succinate (SOLU-CORTEF) inj  50 mg Intravenous Q6H  . insulin aspart  0-9 Units Subcutaneous  6 times per day  . ipratropium-albuterol  3 mL Nebulization TID  . pantoprazole (PROTONIX) IV  40 mg Intravenous Q12H  . piperacillin-tazobactam (ZOSYN)  IV  2.25 g Intravenous Q8H  . [START ON 10/14/2015] vancomycin  500 mg Intravenous Q Fri-HD    have reviewed scheduled and prn medications.  Physical Exam: General: eyes open- no commands- jaundiced- on vent Heart: RRR Lungs: CBS bilat Abdomen: distended Extremities:  pitting edema- black toes on right Dialysis Access: right IJ vascath placed 11/4    10/08/2015,7:57 AM  LOS: 11 days

## 2015-10-08 NOTE — Progress Notes (Signed)
Patient ID: [name] Herman, female   DOB: 11/12/1935, 80 y.o.   MRN: [id number] Pt still intubated but more alert per nurse; daughter in room; BP 104/45 ; HR 74; AF; WBC 20.8(25.7), HGB 8.8(9.7), creat 2.72- last HD yesterday; last LFT's done 11/9 with t bili 10.3;  Bilat chest tubes in place without obvious air leak; CXR today without ptx; dry gangrene right 2-4 toes stable; foot warm. Plans as outlined by CCM, nephrology, vasc.

## 2015-10-08 NOTE — Progress Notes (Signed)
**  Critical Care Interval Note**  Patient intubated.  ABC with alkalosis.  RR decreased to 10.  ABG ordered for am.  Patient seems to be taking steps backwards lately.  May require discussion re trach placement if not able to wean from vent in next few days.  Ashly M. Nadine CountsGottschalk, DO PGY-2, [facility]

## 2015-10-08 NOTE — Progress Notes (Signed)
   Daily Progress Note  Assessment/Planning: POD #11 s/p Evacuation of RPH, repair of R EIA for RPH due to IR procedure   Poor vent went: suspect intraabdominal pressures interfering  New chest tubes noted  Continue supportive care for now   Subjective  - 11 Days Post-Op  No progress on vent wean  Objective Filed Vitals:   10/08/15 0745 10/08/15 0800 10/08/15 0815 10/08/15 0832  BP:  149/53    Pulse: 92 99 99   Temp:    98.9 F (37.2 C)  TempSrc:    Oral  Resp: 21 25 20    Height:      Weight:      SpO2: 100% 100% 100%     Intake/Output Summary (Last 24 hours) at 10/08/15 0836 Last data filed at 10/08/15 0800  Gross per 24 hour  Intake 2488.53 ml  Output   1465 ml  Net 1023.53 ml    ABD incision c/i, serosang drainage on bandages, bulging in retroperitoneal exposure incision (likely habitus),  staples in place VASC  R foot warm, dry gangrenous toes unchanged  Laboratory CBC    Component Value Date/Time   WBC 20.8* 10/08/2015 0532   HGB 8.8* 10/08/2015 0532   HCT 25.9* 10/08/2015 0532   PLT 149* 10/08/2015 0532    BMET    Component Value Date/Time   NA 139 10/08/2015 0533   K 3.6 10/08/2015 0533   CL 103 10/08/2015 0533   CO2 24 10/08/2015 0533   GLUCOSE 166* 10/08/2015 0533   BUN 52* 10/08/2015 0533   CREATININE 2.72* 10/08/2015 0533   CREATININE 1.07* 08/02/2015 1427   CALCIUM 7.8* 10/08/2015 0533   GFRNONAA 16* 10/08/2015 0533   GFRAA 18* 10/08/2015 0533    [name] SakeBrian Brandom Kerwin, MD [facility] Office: [phone] Pager: [phone]  10/08/2015, 8:36 AM

## 2015-10-08 NOTE — Progress Notes (Signed)
RT note-Placed back to full support, passed SBT, patient is resting at this time, will reassess with MD.

## 2015-10-09 ENCOUNTER — Inpatient Hospital Stay (HOSPITAL_COMMUNITY): Payer: Commercial Managed Care - HMO

## 2015-10-09 LAB — HEPATIC FUNCTION PANEL
ALBUMIN: 1.9 g/dL — AB (ref 3.5–5.0)
ALK PHOS: 119 U/L (ref 38–126)
ALT: 62 U/L — AB (ref 14–54)
AST: 41 U/L (ref 15–41)
BILIRUBIN TOTAL: 4.3 mg/dL — AB (ref 0.3–1.2)
Bilirubin, Direct: 2.2 mg/dL — ABNORMAL HIGH (ref 0.1–0.5)
Indirect Bilirubin: 2.1 mg/dL — ABNORMAL HIGH (ref 0.3–0.9)
TOTAL PROTEIN: 5.5 g/dL — AB (ref 6.5–8.1)

## 2015-10-09 LAB — RENAL FUNCTION PANEL
Albumin: 2 g/dL — ABNORMAL LOW (ref 3.5–5.0)
Anion gap: 14 (ref 5–15)
BUN: 91 mg/dL — AB (ref 6–20)
CHLORIDE: 104 mmol/L (ref 101–111)
CO2: 21 mmol/L — AB (ref 22–32)
CREATININE: 3.88 mg/dL — AB (ref 0.44–1.00)
Calcium: 7.7 mg/dL — ABNORMAL LOW (ref 8.9–10.3)
GFR calc non Af Amer: 10 mL/min — ABNORMAL LOW (ref >60)
GFR, EST AFRICAN AMERICAN: 12 mL/min — AB (ref >60)
Glucose, Bld: 139 mg/dL — ABNORMAL HIGH (ref 65–99)
PHOSPHORUS: 3 mg/dL (ref 2.5–4.6)
Potassium: 4.1 mmol/L (ref 3.5–5.1)
Sodium: 139 mmol/L (ref 135–145)

## 2015-10-09 LAB — CBC
HCT: 27.7 % — ABNORMAL LOW (ref 36.0–46.0)
HEMOGLOBIN: 9.3 g/dL — AB (ref 12.0–15.0)
MCH: 29 pg (ref 26.0–34.0)
MCHC: 33.6 g/dL (ref 30.0–36.0)
MCV: 86.3 fL (ref 78.0–100.0)
Platelets: 188 10*3/uL (ref 150–400)
RBC: 3.21 MIL/uL — AB (ref 3.87–5.11)
RDW: 17.9 % — ABNORMAL HIGH (ref 11.5–15.5)
WBC: 26.6 10*3/uL — ABNORMAL HIGH (ref 4.0–10.5)

## 2015-10-09 LAB — GLUCOSE, CAPILLARY
GLUCOSE-CAPILLARY: 131 mg/dL — AB (ref 65–99)
Glucose-Capillary: 107 mg/dL — ABNORMAL HIGH (ref 65–99)
Glucose-Capillary: 118 mg/dL — ABNORMAL HIGH (ref 65–99)
Glucose-Capillary: 127 mg/dL — ABNORMAL HIGH (ref 65–99)
Glucose-Capillary: 128 mg/dL — ABNORMAL HIGH (ref 65–99)
Glucose-Capillary: 143 mg/dL — ABNORMAL HIGH (ref 65–99)
Glucose-Capillary: 144 mg/dL — ABNORMAL HIGH (ref 65–99)

## 2015-10-09 LAB — PHOSPHORUS: Phosphorus: 3.6 mg/dL (ref 2.5–4.6)

## 2015-10-09 LAB — MAGNESIUM
MAGNESIUM: 2 mg/dL (ref 1.7–2.4)
MAGNESIUM: 1.9 mg/dL (ref 1.7–2.4)

## 2015-10-09 LAB — PROCALCITONIN: PROCALCITONIN: 2.75 ng/mL

## 2015-10-09 MED ORDER — PANTOPRAZOLE SODIUM 40 MG PO PACK
40.0000 mg | PACK | Freq: Two times a day (BID) | ORAL | Status: DC
  Administered 2015-10-09 – 2015-10-12 (×6): 40 mg
  Filled 2015-10-09 (×7): qty 20

## 2015-10-09 NOTE — Progress Notes (Signed)
Subjective:  Febrile to 100.4 but otherwise stable overnight- minimal UOP but better than absolutely none- BP staying up  Objective Vital signs in last 24 hours: Filed Vitals:   10/09/15 0400 10/09/15 0500 10/09/15 0536 10/09/15 0600  BP: 148/72  123/48 154/72  Pulse: 85 83  94  Temp: 99.1 F (37.3 C) 99.3 F (37.4 C)  99.3 F (37.4 C)  TempSrc: Core (Comment)     Resp: Height:      Weight:      SpO2: 100% 100% 100% 100%   Weight change: -2.2 kg (-4 lb 13.6 oz)  Intake/Output Summary (Last 24 hours) at 10/09/15 0755 Last data filed at 10/09/15 1610  Gross per 24 hour  Intake 2152.63 ml  Output     88 ml  Net 2064.63 ml    Assessment/ Plan: Pt is a 80 y.o. yo female who was admitted on 09/30/2015 with hemorrhagic shock/PEA arrest after PAD intervention with resultant AKI - prolonged hospital course Assessment/Plan: 1. AKI- due to contrast nephropathy and ATN due to hemorrhagic shock and PEA arrest- had been CRRT dependent from 11/4-11/7, then IHD on 11/9 and 11/11.  Baseline creatinine in September was 1.07.  volume seems OK with FIO2 of 30 and CVP in single digits.  Also CK a little up- maybe an element of rhabdo.  Unmeasured UOP times 3 yest-now  foley to quantify - no indications for dialysis today, likely will need Monday- will arrange but assess first.  Also HD cath in for 9 days- if remains HD dependent will need PC this weel 2. HTN/vol=- CVP single digits and FIo2 30% so dont think is wet.  Is third spacing - Feeling like low BP has to do with sedation- it does get higher at times- no pressors-tolerating  UF-  3. Anemia- after hemorrhagic shock- RPH- hgb dropping slowly- added darbe but likely will need transfusion- s/p one unit given 11/11 4. Elytes- hyperkalemia- corrected with HD 5. VDRF/chest tubes- per CCM 6. Fever- per CCM- now on zosyn and vanc, possibly due to hematoma- wbc up today, fever is back- HD cath in 9 days 7. PAD- dry gangrene of toes  stable  Marli Diego A    Labs: Basic Metabolic Panel:  Recent Labs Lab 10/07/15 0349  10/08/15 0533 10/08/15 1500 10/09/15 0316  NA 139  --  139  --  139  K 4.3  --  3.6  --  4.1  CL 104  --  103  --  104  CO2 21*  --  24  --  21*  GLUCOSE 141*  --  166*  --  139*  BUN 94*  --  52*  --  91*  CREATININE 3.99*  --  2.72*  --  3.88*  CALCIUM 7.8*  --  7.8*  --  7.7*  PHOS 5.2*  < > 2.6 2.6 3.0  < > = values in this interval not displayed. Liver Function Tests:  Recent Labs Lab 10/03/15 0350 10/04/15 0540 10/05/15 0410  10/07/15 0349 10/08/15 0533 10/09/15 0316  AST 117* 127* 74*  --   --   --   --   ALT 103* 87* 74*  --   --   --   --   ALKPHOS 96 162* 182*  --   --   --   --   BILITOT 13.5* 16.9* 10.3*  --   --   --   --   PROT 5.7* 5.0* 5.3*  --   --   --   --  ALBUMIN 2.5* 2.1* 2.1*  < > 2.1* 2.0* 2.0*  < > = values in this interval not displayed. No results for input(s): LIPASE, AMYLASE in the last 168 hours. No results for input(s): AMMONIA in the last 168 hours. CBC:  Recent Labs Lab 10/06/15 0515 10/07/15 0349 10/07/15 1535 10/08/15 0532 10/09/15 0316  WBC 15.5* 16.8* 25.7* 20.8* 26.6*  HGB 7.3* 6.9* 9.7* 8.8* 9.3*  HCT 21.3* 20.7* 27.8* 25.9* 27.7*  MCV 87.7 87.0 82.5 84.1 86.3  PLT 100* 139* 151 149* 188   Cardiac Enzymes: No results for input(s): CKTOTAL, CKMB, CKMBINDEX, TROPONINI in the last 168 hours. CBG:  Recent Labs Lab 10/08/15 1102 10/08/15 1503 10/08/15 1938 10/08/15 2336 10/09/15 0421  GLUCAP 138* 139* 131* 143* 144*    Iron Studies: No results for input(s): IRON, TIBC, TRANSFERRIN, FERRITIN in the last 72 hours. Studies/Results: Dg Chest Port 1 View  10/07/2015  CLINICAL DATA:  Respiratory failure EXAM: PORTABLE CHEST 1 VIEW COMPARISON:  Study obtained earlier in the day FINDINGS: Endotracheal tube tip is 8 mm above the carina. Central catheter tip placed from the right is in the superior vena cava. Cordis placed  on the left has its tip in the left innominate vein. Feeding tube tip extends below the diaphragm. Bilateral chest tubes are present. No pneumothorax apparent. The lungs are now clear; the area of focal consolidation in the left upper lobe seen earlier in the day has resolved. Heart size and pulmonary vascularity are normal. No adenopathy. There are recent rib fractures bilaterally, essentially unchanged. IMPRESSION: Tube and catheter positions as described without pneumothorax. Note that the endotracheal tube tip is near the carina; it may be prudent to consider withdrawing endotracheal tube 1 to 2 cm. Lungs now clear. No change in cardiac silhouette. Note that there is less soft tissue air on the left side compared to earlier in the day. Electronically Signed   By: Bretta BangWilliam  Woodruff III M.D.   On: 10/07/2015 11:21   Medications: Infusions: . sodium chloride 10 mL/hr at 10/08/15 2000  . dexmedetomidine 1.1 mcg/kg/hr (10/09/15 0400)  . fentaNYL infusion INTRAVENOUS Stopped (10/09/15 0721)    Scheduled Medications: . sodium chloride   Intravenous Once  . antiseptic oral rinse  7 mL Mouth Rinse QID  . budesonide  0.25 mg Nebulization Q12H  . chlorhexidine gluconate  15 mL Mouth Rinse BID  . darbepoetin (ARANESP) injection - NON-DIALYSIS  100 mcg Subcutaneous Q Thu-1800  . feeding supplement (PRO-STAT SUGAR FREE 64)  30 mL Per Tube Daily  . feeding supplement (VITAL AF 1.2 CAL)  1,000 mL Per Tube Q24H  . fentaNYL (SUBLIMAZE) injection  50 mcg Intravenous Once  . haloperidol lactate  5 mg Intravenous BID  . hydrocortisone sod succinate (SOLU-CORTEF) inj  50 mg Intravenous Q6H  . insulin aspart  0-9 Units Subcutaneous 6 times per day  . ipratropium-albuterol  3 mL Nebulization TID  . pantoprazole (PROTONIX) IV  40 mg Intravenous Q12H  . piperacillin-tazobactam (ZOSYN)  IV  2.25 g Intravenous Q8H  . pneumococcal 23 valent vaccine  0.5 mL Intramuscular Tomorrow-1000    have reviewed scheduled and  prn medications.  Physical Exam: General: eyes open- no commands- jaundiced- on vent Heart: RRR Lungs: CBS bilat Abdomen: distended Extremities:  pitting edema- black toes on right Dialysis Access: right IJ vascath placed 11/4    10/09/2015,7:55 AM  LOS: 12 days

## 2015-10-09 NOTE — Progress Notes (Signed)
PULMONARY / CRITICAL CARE MEDICINE   Name: [name] Herman MRN: [id number] DOB: 06/10/1935    ADMISSION DATE:  10/05/2015 CONSULTATION DATE:  10/13/2015  REFERRING MD :  Dr. Bonnielee HaffHoss, IR  CHIEF COMPLAINT:  RP bleeding after IR procedure  INITIAL PRESENTATION: 80 year old female who [facility] for iliac artery stent placement. Procedure was done, no stent needed. Post-operatively she complained of nausea and had hypotension. Distended abdomen. CT with with retroperitoneal blood, not actively bleeding per Dr. Bonnielee HaffHoss. To ICU for PCCM management.   STUDIES:  CT (A) abd/pelvis (11/1) >>> Large right retroperitoneal hemorrhage extending from the right inguinal region after angiogram. There is no active extravasation or bleeding. There is no pseudoaneurysm. Lower extremity arterial duplex (11/1) No evidence of psuedoaneurysm in the right groin. A hematoma is noted measuring 3.55cm. CXR (11/3): No significant change in left lower lobe atelectasis or consolidation and mild bilateral perihilar atelectasis CXR (11/4): Endotracheal tube tip remains at the carina. Proximal repositioning of approximately 2 to 3 cm suggested. Remaining lines and tubes in stable position. Persistent bibasilar atelectasis. Mild infiltrate left lung base cannot be excluded. CXR (11/7): 1. Low endotracheal tube with tip 5 mm above the carina. 2. Mild improvement basilar lung aeration with residual retrocardiac atelectasis or pneumonia. CXR (11/8): Tube and catheter positions as described without pneumothorax. Note that the endotracheal tube tip is essentially at the carina. It may be prudent to consider withdrawing endotracheal tube 2-3 cm CXR (11/9): lines stable. Patchy bibasilar pulmonary infiltrates. KUB (11/9): coiled feeding tube CXR (11/11): 1. Bilateral chest tube placement with decreased bilateral pneumothoraces, question tiny persistent right apical component on the right. Complete  resolution on the left.  Endotracheal tube is now 1 cm from the carina, consider retraction of 1-2 cm.  Subcutaneous emphysema in both supraclavicular regions. Probable pneumomediastinum at the thoracic inlet. Linear air again seen in the upper abdomen, location uncertain.   SIGNIFICANT EVENTS: 11/1 to IR for iliac stent, stent not needed, post op RP blood. Shock. To ICU 11/1 coded pea 10 min , OR for evacuation hematoma and iliac repair  11/2- GI bleeding, Total:PRBC 17; FFP 3; Cryo 11renal called 11/3- less bleeding from OGT, no pressors 11/4- HD transitioned to CVVH 11/7- CRRT stopped 11/9- intermittent HD started 11/11- pneumothorax, chest tube placement 11/12 toe ischemia as below. 11/13 no pressors. Vomiting. Gastric distention   SUBJECTIVE:  Grimaces to pain   VITAL SIGNS: Temp:  [98.8 F (37.1 C)-100.4 F (38 C)] 99.5 F (37.5 C) (11/13 1130) Pulse Rate:  [68-103] 91 (11/13 1130) Resp:  [16-40] 26 (11/13 1130) BP: (103-175)/(45-108) 153/59 mmHg (11/13 1000) SpO2:  [99 %-100 %] 100 % (11/13 1130) Arterial Line BP: (99-203)/(38-80) 181/67 mmHg (11/13 1130) FiO2 (%):  [30 %] 30 % (11/13 0900) Weight:  [59.7 kg (131 lb 9.8 oz)] 59.7 kg (131 lb 9.8 oz) (11/13 0200) HEMODYNAMICS: CVP:  [6 mmHg-9 mmHg] 9 mmHg VENTILATOR SETTINGS: Vent Mode:  [-] PRVC FiO2 (%):  [30 %] 30 % Set Rate:  [10 bmp] 10 bmp Vt Set:  [420 mL] 420 mL PEEP:  [5 cmH20] 5 cmH20 Plateau Pressure:  [17 cmH20-20 cmH20] 20 cmH20 INTAKE / OUTPUT:  Intake/Output Summary (Last 24 hours) at 10/09/15 1156 Last data filed at 10/09/15 1100  Gross per 24 hour  Intake 2192.29 ml  Output     88 ml  Net 2104.29 ml    PHYSICAL EXAMINATION:  General: sedated grimaces to pain. Gastric  secretions from mouth  Neuro: PERRLA, +scleral icterus  HEENT: No thryomegaly, JVD Cardiovascular:  S1, S2, RRR Lungs: Clear to auscultation bilaterally, intubated, b/l chest tubes in place; no airleak Abdomen:  Soft, +  hypoactive BS. Abdominal bandages in place Musculoskeletal:  Lower extremities without edema, LE pulses difficult to palpate Skin:  Stable Necrotic appearing R toes, see photo below  Photos (10/03/05)      LABS:  CBC  Recent Labs Lab 10/07/15 1535 10/08/15 0532 10/09/15 0316  WBC 25.7* 20.8* 26.6*  HGB 9.7* 8.8* 9.3*  HCT 27.8* 25.9* 27.7*  PLT 151 149* 188   Coag's  Recent Labs Lab 10/06/15 0830  INR 1.56*   BMET  Recent Labs Lab 10/07/15 0349 10/08/15 0533 10/09/15 0316  NA 139 139 139  K 4.3 3.6 4.1  CL 104 103 104  CO2 21* 24 21*  BUN 94* 52* 91*  CREATININE 3.99* 2.72* 3.88*  GLUCOSE 141* 166* 139*   Electrolytes  Recent Labs Lab 10/07/15 0349  10/08/15 0533 10/08/15 1500 10/09/15 0316  CALCIUM 7.8*  --  7.8*  --  7.7*  MG 2.1  < > 1.9 1.9 2.0  PHOS 5.2*  < > 2.6 2.6 3.0  < > = values in this interval not displayed. Sepsis Markers  Recent Labs Lab 10/07/15 0349 10/08/15 0532 10/09/15 0316  PROCALCITON 2.87 3.45 2.75   ABG  Recent Labs Lab 10/07/15 0310 10/07/15 2259 10/08/15 0335  PHART 7.439 7.513* 7.502*  PCO2ART 29.7* 33.7* 31.5*  PO2ART 366* 104.0* 139*   Liver Enzymes  Recent Labs Lab 10/04/15 0540 10/05/15 0410  10/08/15 0533 10/09/15 0316 10/09/15 1045  AST 127* 74*  --   --   --  41  ALT 87* 74*  --   --   --  62*  ALKPHOS 162* 182*  --   --   --  119  BILITOT 16.9* 10.3*  --   --   --  4.3*  ALBUMIN 2.1* 2.1*  < > 2.0* 2.0* 1.9*  < > = values in this interval not displayed. Cardiac Enzymes No results for input(s): TROPONINI, PROBNP in the last 168 hours. Glucose  Recent Labs Lab 10/08/15 1503 10/08/15 1938 10/08/15 2336 10/09/15 0421 10/09/15 0716 10/09/15 1101  GLUCAP 139* 131* 143* 144* 127* 107*    Imaging No results found. ASSESSMENT / PLAN:  PULMONARY A: Acute resp failure, hypoxia s/p PEA arrest  COPD  Spontaneous bilateral pneumothorax, now s/p bilateral CT >failing weaning efforts.   P:   Fentanyl, wean as able Precedex Intermittent HD resumed 11/09 CXR ordered  CARDIOVASCULAR CVL LIJ sheath 11/1 >>>  ALINE left fem 11/1>>> A:  Hemorrhagic shock resolved Ischemic toes  PAD H/o HTN Trop leak, s/p cpr P:  Keep even volume status BP control   RENAL A:   Acute on chronic renal failure, ATN, post arrest , shock, rhabdo -->stable Hypernatremia, improved Hyperkalemia, improved Hypophos, improved  P:   Daily BMET Replete electrolytes as needed Intermittent HD per renal-->plan for 11/14 likely   GASTROINTESTINAL A:   H/o PUD abd pain and gastric reflux vs ileus  P:   On trickle feeds, advancing rate as tolerated-->hold, place NGT to LIWS PPI bid  HEMATOLOGIC A:   Acute retroperitoneal hemorrhage s/p IR arteriogram  Consumptive / dilutional coagulapthy DIC -->resolved  P:  CBC to q24 Follow CBC, platelets, coags  INFECTIOUS A:   HCAP -->prelim SA 1/2 coag neg staph  P:   Blood cx  11/9>> 1/2 coag neg SA  Vanc 11/9>> Zosyn 11/9>>  ENDOCRINE A:   Chronic prednisone for polymyalgia rheumatica GI bleed P:   Hydrocortisone to 50 mg q6 Follow glucose on Bmet SSI  NEUROLOGIC A:   Acute metabolic encephalopathy up and down R/o anoxia, post arrest P:   RASS goal: 0 Monitor closely Fentanyl discontinued 11/9 Avoid benzo  FAMILY  - Updates: family updated in full 11/11 - Inter-disciplinary family meet or Palliative Care meeting due by:  11/7  Failing weaning efforts. Getting to the point we need to think about trach. I am concerned that her overall prognosis is very poor. Will get CXR and abd film to assess weaning failing. Cont abx, otherwise cont supportive care.   10/09/2015, 11:56 AM  Attending Note:  80 year old female with RP bleeding after iliac artery stent placement who developed respiratory failure due to pulmonary edema post transfusion. Subsequently developed a PTX and chest tube was placed. Patient overall is now  hemodynamically stable. CT has not leak. Continues on full suction. Continue with stress dose steroids. GPC in blood, will continue with vanc/zosyn until speciation. Hold off weaning for now given active infection and necrotizing toes. Will allow for relatively high pressure hopefully to improve perfusion now that RP bleed is essentially under control.  Spoke with daughter, grand daughter and son in Social worker.  I informed them of her situation and that she is unlikely to survive this.  After discussion, confirmed DNR and plan will be to dialyze tomorrow, remove as much fluid as possible then extubate with no intention to reintubate.  They understand that we are not in a condition where we can fix the patient without aggressive interventions but she was clear to her children that she did not want to this level of aggressive care.  The patient is critically ill with multiple organ systems failure and requires high complexity decision making for assessment and support, frequent evaluation and titration of therapies, application of advanced monitoring technologies and extensive interpretation of multiple databases.   Critical Care Time devoted to patient care services described in this note is 35 Minutes. This time reflects time of care of this signee Dr Koren Bound. This critical care time does not reflect procedure time, or teaching time or supervisory time of PA/NP/Med student/Med Resident etc but could involve care discussion time.  Alyson Reedy, M.D. [facility] Pulmonary/Critical Care Medicine. Pager: [phone]. After hours pager: [phone].

## 2015-10-09 NOTE — Progress Notes (Signed)
CCM NP made aware that pt's abdomen seems more distended today and that she continues to have "bile" colored spit up. New orders to stop tube feed and place OG tube to suction.  CCM NP made aware of output from OG tube. Family updated and at bedside.

## 2015-10-10 LAB — CULTURE, BLOOD (ROUTINE X 2): Culture: NO GROWTH

## 2015-10-10 LAB — RENAL FUNCTION PANEL
Albumin: 1.7 g/dL — ABNORMAL LOW (ref 3.5–5.0)
Albumin: 2.1 g/dL — ABNORMAL LOW (ref 3.5–5.0)
Anion gap: 15 (ref 5–15)
Anion gap: 17 — ABNORMAL HIGH (ref 5–15)
BUN: 128 mg/dL — AB (ref 6–20)
BUN: 82 mg/dL — ABNORMAL HIGH (ref 6–20)
CHLORIDE: 105 mmol/L (ref 101–111)
CHLORIDE: 103 mmol/L (ref 101–111)
CO2: 20 mmol/L — AB (ref 22–32)
CO2: 23 mmol/L (ref 22–32)
CREATININE: 3.49 mg/dL — AB (ref 0.44–1.00)
Calcium: 7.8 mg/dL — ABNORMAL LOW (ref 8.9–10.3)
Calcium: 8.2 mg/dL — ABNORMAL LOW (ref 8.9–10.3)
Creatinine, Ser: 5 mg/dL — ABNORMAL HIGH (ref 0.44–1.00)
GFR calc Af Amer: 9 mL/min — ABNORMAL LOW (ref >60)
GFR calc non Af Amer: 12 mL/min — ABNORMAL LOW (ref >60)
GFR, EST AFRICAN AMERICAN: 13 mL/min — AB (ref >60)
GFR, EST NON AFRICAN AMERICAN: 8 mL/min — AB (ref >60)
GLUCOSE: 125 mg/dL — AB (ref 65–99)
Glucose, Bld: 104 mg/dL — ABNORMAL HIGH (ref 65–99)
PHOSPHORUS: 4 mg/dL (ref 2.5–4.6)
POTASSIUM: 4.6 mmol/L (ref 3.5–5.1)
Phosphorus: 5.3 mg/dL — ABNORMAL HIGH (ref 2.5–4.6)
Potassium: 4.2 mmol/L (ref 3.5–5.1)
Sodium: 140 mmol/L (ref 135–145)
Sodium: 143 mmol/L (ref 135–145)

## 2015-10-10 LAB — BLOOD GAS, ARTERIAL
ACID-BASE DEFICIT: 4.9 mmol/L — AB (ref 0.0–2.0)
Bicarbonate: 18.9 mEq/L — ABNORMAL LOW (ref 20.0–24.0)
Drawn by: 362771
FIO2: 0.3
LHR: 10 {breaths}/min
O2 SAT: 98.6 %
PEEP: 5 cmH2O
PO2 ART: 123 mmHg — AB (ref 80.0–100.0)
Patient temperature: 98.6
TCO2: 19.8 mmol/L (ref 0–100)
VT: 420 mL
pCO2 arterial: 30.6 mmHg — ABNORMAL LOW (ref 35.0–45.0)
pH, Arterial: 7.407 (ref 7.350–7.450)

## 2015-10-10 LAB — GLUCOSE, CAPILLARY
GLUCOSE-CAPILLARY: 100 mg/dL — AB (ref 65–99)
GLUCOSE-CAPILLARY: 112 mg/dL — AB (ref 65–99)
GLUCOSE-CAPILLARY: 142 mg/dL — AB (ref 65–99)
Glucose-Capillary: 112 mg/dL — ABNORMAL HIGH (ref 65–99)
Glucose-Capillary: 107 mg/dL — ABNORMAL HIGH (ref 65–99)
Glucose-Capillary: 100 mg/dL — ABNORMAL HIGH (ref 65–99)

## 2015-10-10 LAB — PHOSPHORUS
PHOSPHORUS: 5.3 mg/dL — AB (ref 2.5–4.6)
PHOSPHORUS: 3.9 mg/dL (ref 2.5–4.6)

## 2015-10-10 LAB — CULTURE, RESPIRATORY W GRAM STAIN: Special Requests: NORMAL

## 2015-10-10 LAB — MAGNESIUM
MAGNESIUM: 2.1 mg/dL (ref 1.7–2.4)
Magnesium: 2 mg/dL (ref 1.7–2.4)

## 2015-10-10 LAB — CULTURE, RESPIRATORY

## 2015-10-10 LAB — PROCALCITONIN: PROCALCITONIN: 1.76 ng/mL

## 2015-10-10 MED ORDER — SODIUM CHLORIDE 0.9 % IV SOLN
500.0000 mg | INTRAVENOUS | Status: DC | PRN
  Filled 2015-10-10: qty 500

## 2015-10-10 MED ORDER — HEPARIN (PORCINE) 2000 UNITS/L FOR CRRT
INTRAVENOUS_CENTRAL | Status: DC | PRN
  Filled 2015-10-10: qty 1000

## 2015-10-10 MED ORDER — DEXTROSE 5 % IV SOLN
0.0000 ug/min | INTRAVENOUS | Status: DC
  Administered 2015-10-10: 20 ug/min via INTRAVENOUS
  Administered 2015-10-11: 80 ug/min via INTRAVENOUS
  Administered 2015-10-11: 30 ug/min via INTRAVENOUS
  Administered 2015-10-11: 50 ug/min via INTRAVENOUS
  Administered 2015-10-11: 20 ug/min via INTRAVENOUS
  Administered 2015-10-12: 50 ug/min via INTRAVENOUS
  Filled 2015-10-10 (×7): qty 1

## 2015-10-10 MED ORDER — PRISMASOL BGK 4/2.5 32-4-2.5 MEQ/L IV SOLN
INTRAVENOUS | Status: DC
  Administered 2015-10-10 – 2015-10-11 (×2): via INTRAVENOUS_CENTRAL
  Filled 2015-10-10 (×3): qty 5000

## 2015-10-10 MED ORDER — VANCOMYCIN HCL 500 MG IV SOLR
500.0000 mg | Freq: Once | INTRAVENOUS | Status: DC
  Filled 2015-10-10: qty 500

## 2015-10-10 MED ORDER — CEFAZOLIN SODIUM-DEXTROSE 2-3 GM-% IV SOLR
2.0000 g | Freq: Two times a day (BID) | INTRAVENOUS | Status: DC
  Administered 2015-10-10 – 2015-10-12 (×5): 2 g via INTRAVENOUS
  Filled 2015-10-10 (×6): qty 50

## 2015-10-10 MED ORDER — HEPARIN SODIUM (PORCINE) 1000 UNIT/ML DIALYSIS
1000.0000 [IU] | INTRAMUSCULAR | Status: DC | PRN
  Filled 2015-10-10: qty 6

## 2015-10-10 MED ORDER — PRISMASOL BGK 4/2.5 32-4-2.5 MEQ/L IV SOLN
INTRAVENOUS | Status: DC
  Administered 2015-10-10 – 2015-10-12 (×4): via INTRAVENOUS_CENTRAL
  Filled 2015-10-10 (×4): qty 5000

## 2015-10-10 MED ORDER — PRISMASOL BGK 4/2.5 32-4-2.5 MEQ/L IV SOLN
INTRAVENOUS | Status: DC
  Administered 2015-10-10 – 2015-10-12 (×12): via INTRAVENOUS_CENTRAL
  Filled 2015-10-10 (×24): qty 5000

## 2015-10-10 NOTE — Care Management Note (Signed)
Case Management Note  Patient Details  Name: Orma FlamingMartha H Kelner MRN: [id number] Date of Birth: 07/30/1935  Subjective/Objective:        Pt continues sedated on Vent. IV Fentanyl/Precedex.             Action/Plan:CM will continue to follow.    Expected Discharge Date:                  Expected Discharge Plan:  IP Rehab Facility  In-House Referral:     Discharge planning Services  CM Consult  Post Acute Care Choice:    Choice offered to:     DME Arranged:    DME Agency:     HH Arranged:    HH Agency:     Status of Service:  In process, will continue to follow  Medicare Important Message Given:  Yes-second notification given Date Medicare IM Given:    Medicare IM give by:    Date Additional Medicare IM Given:    Additional Medicare Important Message give by:     If discussed at Long Length of Stay Meetings, dates discussed:    Additional Comments:  Yvone NeuCrutchfield, Rosiland Sen M, RN 10/10/2015, 10:35 AM

## 2015-10-10 NOTE — Progress Notes (Signed)
PULMONARY / CRITICAL CARE MEDICINE   Name: [name] Herman MRN: [id number] DOB: [birth year]    ADMISSION DATE:  2015/10/18 CONSULTATION DATE:  10-18-2015  REFERRING MD :  Dr. Bonnielee Haff, IR  CHIEF COMPLAINT:  RP bleeding after IR procedure  INITIAL PRESENTATION: 80 year old female who presented to [facility] 10-18-2015 for iliac artery stent placement. Procedure was done, no stent needed. Post-operatively she complained of nausea and had hypotension. Distended abdomen. CT with with retroperitoneal blood, not actively bleeding per Dr. Bonnielee Haff. To ICU for PCCM management.   STUDIES:  CT (A) abd/pelvis 10/18/23) >>> Large right retroperitoneal hemorrhage extending from the right inguinal region after angiogram. There is no active extravasation or bleeding. There is no pseudoaneurysm. Lower extremity arterial duplex 10-18-23) No evidence of psuedoaneurysm in the right groin. A hematoma is noted measuring 3.55cm. CXR (11/3): No significant change in left lower lobe atelectasis or consolidation and mild bilateral perihilar atelectasis CXR (11/4): Endotracheal tube tip remains at the carina. Proximal repositioning of approximately 2 to 3 cm suggested. Remaining lines and tubes in stable position. Persistent bibasilar atelectasis. Mild infiltrate left lung base cannot be excluded. CXR (11/7): 1. Low endotracheal tube with tip 5 mm above the carina. 2. Mild improvement basilar lung aeration with residual retrocardiac atelectasis or pneumonia. CXR (11/8): Tube and catheter positions as described without pneumothorax. Note that the endotracheal tube tip is essentially at the carina. It may be prudent to consider withdrawing endotracheal tube 2-3 cm CXR (11/9): lines stable. Patchy bibasilar pulmonary infiltrates. KUB (11/9): coiled feeding tube CXR (11/11): 1. Bilateral chest tube placement with decreased bilateral pneumothoraces, question tiny persistent right apical component on the right. Complete  resolution on the left.  Endotracheal tube is now 1 cm from the carina, consider retraction of 1-2 cm.  Subcutaneous emphysema in both supraclavicular regions. Probable pneumomediastinum at the thoracic inlet. Linear air again seen in the upper abdomen, location uncertain.   SIGNIFICANT EVENTS: 2023/10/18 to IR for iliac stent, stent not needed, post op RP blood. Shock. To ICU 2023-10-18 coded pea 10 min , OR for evacuation hematoma and iliac repair  11/2- GI bleeding, Total:PRBC 17; FFP 3; Cryo 11renal called 11/3- less bleeding from OGT, no pressors 11/4- HD transitioned to CVVH 11/7- CRRT stopped 11/9- intermittent HD started 11/11- pneumothorax, chest tube placement 11/12 toe ischemia as below. 11/13 no pressors. Vomiting. Gastric distention   SUBJECTIVE:  Sedated.    VITAL SIGNS: Temp:  [96.3 F (35.7 C)-99.9 F (37.7 C)] 96.3 F (35.7 C) (11/14 0600) Pulse Rate:  [54-105] 54 (11/14 0700) Resp:  [15-40] 15 (11/14 0700) BP: (100-163)/(43-92) 117/50 mmHg (11/14 0600) SpO2:  [100 %] 100 % (11/14 0700) Arterial Line BP: (105-220)/(39-91) 135/46 mmHg (11/14 0500) FiO2 (%):  [30 %] 30 % (11/14 0341) Weight:  [140 lb 3.4 oz (63.6 kg)] 140 lb 3.4 oz (63.6 kg) (11/14 0338) HEMODYNAMICS: CVP:  [9 mmHg] 9 mmHg VENTILATOR SETTINGS: Vent Mode:  [-] PRVC FiO2 (%):  [30 %] 30 % Set Rate:  [10 bmp] 10 bmp Vt Set:  [420 mL] 420 mL PEEP:  [5 cmH20] 5 cmH20 Plateau Pressure:  [16 cmH20-20 cmH20] 16 cmH20 INTAKE / OUTPUT:  Intake/Output Summary (Last 24 hours) at 10/10/15 0751 Last data filed at 10/10/15 0600  Gross per 24 hour  Intake 1283.62 ml  Output   1603 ml  Net -319.38 ml    PHYSICAL EXAMINATION:  General: sedated. Intubated. Neuro: PERRLA, pupils +scleral icterus, pupils 1.5 mm  b/l HEENT: No thryomegaly, JVD Cardiovascular:  S1S2, RRR Lungs: Clear to auscultation bilaterally, intubated, b/l chest tubes in place; no airleak Abdomen:  Soft, + hypoactive BS. Abdominal bandages in  place Musculoskeletal:  Lower extremities without edema, LE pulses difficult to palpate Skin:  Stable Necrotic appearing R toes, see photo below  Photos (10/03/05)      LABS:  CBC  Recent Labs Lab 10/07/15 1535 10/08/15 0532 10/09/15 0316  WBC 25.7* 20.8* 26.6*  HGB 9.7* 8.8* 9.3*  HCT 27.8* 25.9* 27.7*  PLT 151 149* 188   Coag's  Recent Labs Lab 10/06/15 0830  INR 1.56*   BMET  Recent Labs Lab 10/08/15 0533 10/09/15 0316 10/10/15 0400  NA 139 139 140  K 3.6 4.1 4.6  CL 103 104 105  CO2 24 21* 20*  BUN 52* 91* 128*  CREATININE 2.72* 3.88* 5.00*  GLUCOSE 166* 139* 125*   Electrolytes  Recent Labs Lab 10/08/15 0533  10/09/15 0316 10/09/15 1525 10/10/15 0400  CALCIUM 7.8*  --  7.7*  --  7.8*  MG 1.9  < > 2.0 1.9 2.1  PHOS 2.6  < > 3.0 3.6 5.3*  5.3*  < > = values in this interval not displayed. Sepsis Markers  Recent Labs Lab 10/08/15 0532 10/09/15 0316 10/10/15 0400  PROCALCITON 3.45 2.75 1.76   ABG  Recent Labs Lab 10/07/15 2259 10/08/15 0335 10/10/15 0322  PHART 7.513* 7.502* 7.407  PCO2ART 33.7* 31.5* 30.6*  PO2ART 104.0* 139* 123*   Liver Enzymes  Recent Labs Lab 10/04/15 0540 10/05/15 0410  10/09/15 0316 10/09/15 1045 10/10/15 0400  AST 127* 74*  --   --  41  --   ALT 87* 74*  --   --  62*  --   ALKPHOS 162* 182*  --   --  119  --   BILITOT 16.9* 10.3*  --   --  4.3*  --   ALBUMIN 2.1* 2.1*  < > 2.0* 1.9* 1.7*  < > = values in this interval not displayed. Cardiac Enzymes No results for input(s): TROPONINI, PROBNP in the last 168 hours. Glucose  Recent Labs Lab 10/09/15 0716 10/09/15 1101 10/09/15 1526 10/09/15 1925 10/09/15 2336 10/10/15 0349  GLUCAP 127* 107* 118* 128* 142* 112*    Imaging Dg Chest Port 1 View  10/09/2015  CLINICAL DATA:  80 year old female with acute respiratory failure. EXAM: PORTABLE CHEST 1 VIEW COMPARISON:  10/07/2015 and prior studies FINDINGS: Cardiomediastinal silhouette is  unchanged. An endotracheal tube with tip 1 cm above the carina, NG tube with tip overlying the peripyloric region, small bore feeding tube in the jejunum with tip off the field of view, right IJ central venous catheter with tip overlying the superior cavoatrial junction, left IJ central venous catheter sheath and bilateral thoracostomy tubes again noted. There is no evidence of pneumothorax. Mild bibasilar atelectasis again identified in this mildly low volume film. IMPRESSION: Unchanged appearance of the chest with support apparatus as described. No evidence of pneumothorax. Continued mild bibasilar atelectasis. Electronically Signed   By: Harmon PierJeffrey  Hu M.D.   On: 10/09/2015 15:27   Dg Abd Portable 1v  10/09/2015  CLINICAL DATA:  Evaluate OG tube placement. EXAM: PORTABLE ABDOMEN - 1 VIEW COMPARISON:  10/06/2015 and prior exams FINDINGS: A small bore feeding tube is identified with tip overlying the proximal jejunum. An OG tube is present with tip overlying the peripyloric region. Surgical staples overlying the abdomen and pelvis are noted. No dilated bowel loops  are identified. IMPRESSION: OG tube with tip overlying the peripyloric region. Small bore feeding tube with tip overlying the proximal jejunum. Electronically Signed   By: Harmon Pier M.D.   On: 10/09/2015 15:34   ASSESSMENT / PLAN:  PULMONARY A: Acute resp failure, hypoxia s/p PEA arrest  COPD  Spontaneous bilateral pneumothorax, now s/p bilateral CT >failing weaning efforts.  P:   Fentanyl, wean as able Precedex Intermittent HD resumed 11/09 CXR ordered Need to consider trach, approaching 2 weeks of intubation.  CARDIOVASCULAR CVL LIJ sheath 11/1 >>>  ALINE left fem 11/1>>> A:  Hemorrhagic shock resolved Ischemic toes  PAD H/o HTN Trop leak, s/p cpr P:  Keep even volume status BP control   RENAL A:   Acute on chronic renal failure, ATN, post arrest , shock, rhabdo -->stable Hypernatremia, improved Hyperkalemia,  improved Hypophos, improved Cr 5.00 P:   Daily BMET Replete electrolytes as needed Intermittent HD per renal-->plan for 11/14 likely   GASTROINTESTINAL A:   H/o PUD abd pain and gastric reflux vs ileus  P:   On trickle feeds, advancing rate as tolerated-->hold, place NGT to LIWS PPI bid  HEMATOLOGIC A:   Acute retroperitoneal hemorrhage s/p IR arteriogram  Consumptive / dilutional coagulapthy DIC -->resolved PT 18.7, INR 1.56, hgb 9.3, plt 188 P:  CBC to q24 Follow CBC, platelets, coags  INFECTIOUS A:   HCAP -->prelim SA 1/2 coag neg staph Hypothermic 96.43F, PCT improving 1.76 P:   Blood cx 11/9>> 1/2 coag neg SA Trach aspirate>> Staph aureus  Vanc 11/9>> Zosyn 11/9>>  ENDOCRINE A:   Chronic prednisone for polymyalgia rheumatica GI bleed P:   Hydrocortisone to 50 mg q6 Follow glucose on Bmet SSI  NEUROLOGIC A:   Acute metabolic encephalopathy up and down R/o anoxia, post arrest P:   RASS goal: 0 Monitor closely Fentanyl discontinued 11/9 Avoid benzo  FAMILY  - Updates: family updated in full 11/11 - Inter-disciplinary family meet or Palliative Care meeting due by:  11/7  Ashly M. Nadine Counts, DO PGY-2, [facility]  Attending Note:  80 year old female with respiratory failure, heart failure and renal failure, presented for iliac artery stent, decompensated and bled.  Required intubation.  Now fluid overloaded.  The family is clear that patient does not want to live on machines.  We have been having ongoing discussion regarding plan of care, patient has been intubated for 2 wks.  Renal start CRRT at 50-150 ml/hr negative.  Goal is to remove enough fluid then extubate patient in AM to give her some time to spend with her family.  They are aware that she is unlikely to leave the hospital as the patient did not want prolonged support and will likely develop respiratory failure again.  They do not wish for trach/peg and unlikely to want further  intubation.  Anticipate this to take place in AM.  The patient is critically ill with multiple organ systems failure and requires high complexity decision making for assessment and support, frequent evaluation and titration of therapies, application of advanced monitoring technologies and extensive interpretation of multiple databases.   Critical Care Time devoted to patient care services described in this note is  35  Minutes. This time reflects time of care of this signee Dr Koren Bound. This critical care time does not reflect procedure time, or teaching time or supervisory time of PA/NP/Med student/Med Resident etc but could involve care discussion time.  Alyson Reedy, M.D. [facility] Pulmonary/Critical Care Medicine. Pager: [phone].  After hours pager: [phone].  10/10/2015, 7:51 AM

## 2015-10-10 NOTE — Progress Notes (Signed)
[facility] ROUNDING NOTE   Subjective:   Interval History:  Minimal urine output -- appears to have worsening chest X Ray and signs of volume overload   Objective:  Vital signs in last 24 hours:  Temp:  [96.1 F (35.6 C)-99.9 F (37.7 C)] 97.2 F (36.2 C) (11/14 1000) Pulse Rate:  [54-105] 68 (11/14 1000) Resp:  [14-32] 16 (11/14 1000) BP: (100-163)/(43-92) 122/46 mmHg (11/14 1000) SpO2:  [100 %] 100 % (11/14 1000) Arterial Line BP: (105-220)/(37-91) 112/37 mmHg (11/14 1000) FiO2 (%):  [30 %] 30 % (11/14 0910) Weight:  [63.6 kg (140 lb 3.4 oz)] 63.6 kg (140 lb 3.4 oz) (11/14 0338)  Weight change: 3.9 kg (8 lb 9.6 oz) Filed Weights   10/08/15 0500 10/09/15 0200 10/10/15 0338  Weight: 58.6 kg (129 lb 3 oz) 59.7 kg (131 lb 9.8 oz) 63.6 kg (140 lb 3.4 oz)    Intake/Output: I/O last 3 completed shifts: In: 2409.3 [I.V.:1389.3; Other:20; NG/GT:800; IV Piggyback:200] Out: 1651 [Urine:127; Emesis/NG output:1500; Chest Tube:24]   Intake/Output this shift:  Total I/O In: 123 [I.V.:123] Out: 125 [Other:125] General: sedated    HEENT: No thryomegaly, JVD not visualized Cardiovascular: RRR no rubs   Lungs: bilateral chest tubes in place  Clear with some scattered rhonchi Abdomen: Soft, + hypoactive BS. Abdominal bandages in place No edema  Skin: Stable Necrotic appearing R toe   Basic Metabolic Panel:  Recent Labs Lab 10/06/15 0515  10/07/15 0349  10/08/15 0533 10/08/15 1500 10/09/15 0316 10/09/15 1525 10/10/15 0400  NA 137  --  139  --  139  --  139  --  140  K 4.0  --  4.3  --  3.6  --  4.1  --  4.6  CL 102  --  104  --  103  --  104  --  105  CO2 25  --  21*  --  24  --  21*  --  20*  GLUCOSE 100*  --  141*  --  166*  --  139*  --  125*  BUN 55*  --  94*  --  52*  --  91*  --  128*  CREATININE 2.67*  --  3.99*  --  2.72*  --  3.88*  --  5.00*  CALCIUM 7.7*  --  7.8*  --  7.8*  --  7.7*  --  7.8*  MG 2.0  < > 2.1  < > 1.9 1.9 2.0 1.9 2.1  PHOS  3.3  < > 5.2*  < > 2.6 2.6 3.0 3.6 5.3*  5.3*  < > = values in this interval not displayed.  Liver Function Tests:  Recent Labs Lab 10/04/15 0540 10/05/15 0410  10/07/15 0349 10/08/15 0533 10/09/15 0316 10/09/15 1045 10/10/15 0400  AST 127* 74*  --   --   --   --  41  --   ALT 87* 74*  --   --   --   --  62*  --   ALKPHOS 162* 182*  --   --   --   --  119  --   BILITOT 16.9* 10.3*  --   --   --   --  4.3*  --   PROT 5.0* 5.3*  --   --   --   --  5.5*  --   ALBUMIN 2.1* 2.1*  < > 2.1* 2.0* 2.0* 1.9* 1.7*  < > = values in this  interval not displayed. No results for input(s): LIPASE, AMYLASE in the last 168 hours. No results for input(s): AMMONIA in the last 168 hours.  CBC:  Recent Labs Lab 10/06/15 0515 10/07/15 0349 10/07/15 1535 10/08/15 0532 10/09/15 0316  WBC 15.5* 16.8* 25.7* 20.8* 26.6*  HGB 7.3* 6.9* 9.7* 8.8* 9.3*  HCT 21.3* 20.7* 27.8* 25.9* 27.7*  MCV 87.7 87.0 82.5 84.1 86.3  PLT 100* 139* 151 149* 188    Cardiac Enzymes: No results for input(s): CKTOTAL, CKMB, CKMBINDEX, TROPONINI in the last 168 hours.  BNP: Invalid input(s): POCBNP  CBG:  Recent Labs Lab 10/09/15 1526 10/09/15 1925 10/09/15 2336 10/10/15 0349 10/10/15 0836  GLUCAP 118* 128* 142* 112* 107*    Microbiology: Results for orders placed or performed during the hospital encounter of 10/24/2015  MRSA PCR Screening     Status: None   Collection Time: 10/26/2015  5:16 PM  Result Value Ref Range Status   MRSA by PCR NEGATIVE NEGATIVE Final    Comment:        The GeneXpert MRSA Assay (FDA approved for NASAL specimens only), is one component of a comprehensive MRSA colonization surveillance program. It is not intended to diagnose MRSA infection nor to guide or monitor treatment for MRSA infections.   Culture, blood (routine x 2)     Status: None   Collection Time: 10/05/15 10:25 AM  Result Value Ref Range Status   Specimen Description BLOOD RIGHT HAND  Final   Special  Requests AEROBIC BOTTLE ONLY 6CC  Final   Culture  Setup Time   Final    GRAM POSITIVE COCCI IN CLUSTERS AEROBIC BOTTLE ONLY CRITICAL RESULT CALLED TO, READ BACK BY AND VERIFIED WITH: L ROBBINS RN 1555 10/06/15 A BROWNING    Culture   Final    STAPHYLOCOCCUS SPECIES (COAGULASE NEGATIVE) THE SIGNIFICANCE OF ISOLATING THIS ORGANISM FROM A SINGLE SET OF BLOOD CULTURES WHEN MULTIPLE SETS ARE DRAWN IS UNCERTAIN. PLEASE NOTIFY THE MICROBIOLOGY DEPARTMENT WITHIN ONE WEEK IF SPECIATION AND SENSITIVITIES ARE REQUIRED.    Report Status 10/08/2015 FINAL  Final  Culture, blood (routine x 2)     Status: None (Preliminary result)   Collection Time: 10/05/15 10:30 AM  Result Value Ref Range Status   Specimen Description BLOOD RIGHT ANTECUBITAL  Final   Special Requests IN PEDIATRIC BOTTLE 3CC  Final   Culture NO GROWTH 4 DAYS  Final   Report Status PENDING  Incomplete  Culture, respiratory (NON-Expectorated)     Status: None   Collection Time: 10/07/15 10:07 PM  Result Value Ref Range Status   Specimen Description TRACHEAL ASPIRATE  Final   Special Requests Normal  Final   Gram Stain   Final    ABUNDANT WBC PRESENT,BOTH PMN AND MONONUCLEAR RARE SQUAMOUS EPITHELIAL CELLS PRESENT ABUNDANT GRAM POSITIVE COCCI IN PAIRS IN CLUSTERS FEW GRAM NEGATIVE RODS Performed at [facility]    Culture   Final    MODERATE STAPHYLOCOCCUS AUREUS Note: RIFAMPIN AND GENTAMICIN SHOULD NOT BE USED AS SINGLE DRUGS FOR TREATMENT OF STAPH INFECTIONS. This organism is presumed to be Clindamycin resistant based on detection of inducible Clindamycin resistance. Performed at [facility]    Report Status 10/10/2015 FINAL  Final   Organism ID, Bacteria STAPHYLOCOCCUS AUREUS  Final      Susceptibility   Staphylococcus aureus - MIC*    CLINDAMYCIN RESISTANT      ERYTHROMYCIN >=8 RESISTANT Resistant     GENTAMICIN <=0.5 SENSITIVE Sensitive  LEVOFLOXACIN 0.25 SENSITIVE Sensitive     OXACILLIN 0.5  SENSITIVE Sensitive     RIFAMPIN <=0.5 SENSITIVE Sensitive     TRIMETH/SULFA <=10 SENSITIVE Sensitive     VANCOMYCIN <=0.5 SENSITIVE Sensitive     TETRACYCLINE <=1 SENSITIVE Sensitive     MOXIFLOXACIN <=0.25 SENSITIVE Sensitive     * MODERATE STAPHYLOCOCCUS AUREUS    Coagulation Studies: No results for input(s): LABPROT, INR in the last 72 hours.  Urinalysis: No results for input(s): COLORURINE, LABSPEC, PHURINE, GLUCOSEU, HGBUR, BILIRUBINUR, KETONESUR, PROTEINUR, UROBILINOGEN, NITRITE, LEUKOCYTESUR in the last 72 hours.  Invalid input(s): APPERANCEUR    Imaging: Dg Chest Port 1 View  10/09/2015  CLINICAL DATA:  80 year old female with acute respiratory failure. EXAM: PORTABLE CHEST 1 VIEW COMPARISON:  10/07/2015 and prior studies FINDINGS: Cardiomediastinal silhouette is unchanged. An endotracheal tube with tip 1 cm above the carina, NG tube with tip overlying the peripyloric region, small bore feeding tube in the jejunum with tip off the field of view, right IJ central venous catheter with tip overlying the superior cavoatrial junction, left IJ central venous catheter sheath and bilateral thoracostomy tubes again noted. There is no evidence of pneumothorax. Mild bibasilar atelectasis again identified in this mildly low volume film. IMPRESSION: Unchanged appearance of the chest with support apparatus as described. No evidence of pneumothorax. Continued mild bibasilar atelectasis. Electronically Signed   By: Harmon Pier M.D.   On: 10/09/2015 15:27   Dg Abd Portable 1v  10/09/2015  CLINICAL DATA:  Evaluate OG tube placement. EXAM: PORTABLE ABDOMEN - 1 VIEW COMPARISON:  10/06/2015 and prior exams FINDINGS: A small bore feeding tube is identified with tip overlying the proximal jejunum. An OG tube is present with tip overlying the peripyloric region. Surgical staples overlying the abdomen and pelvis are noted. No dilated bowel loops are identified. IMPRESSION: OG tube with tip overlying the  peripyloric region. Small bore feeding tube with tip overlying the proximal jejunum. Electronically Signed   By: Harmon Pier M.D.   On: 10/09/2015 15:34     Medications:   . sodium chloride 20 mL/hr at 10/10/15 0600  . dexmedetomidine 1.1 mcg/kg/hr (10/10/15 0417)  . fentaNYL infusion INTRAVENOUS 50 mcg/hr (10/09/15 1900)  . dialysis replacement fluid (prismasate)    . dialysis replacement fluid (prismasate)     . sodium chloride   Intravenous Once  . antiseptic oral rinse  7 mL Mouth Rinse QID  . budesonide  0.25 mg Nebulization Q12H  . chlorhexidine gluconate  15 mL Mouth Rinse BID  . darbepoetin (ARANESP) injection - NON-DIALYSIS  100 mcg Subcutaneous Q Thu-1800  . feeding supplement (PRO-STAT SUGAR FREE 64)  30 mL Per Tube Daily  . feeding supplement (VITAL AF 1.2 CAL)  1,000 mL Per Tube Q24H  . fentaNYL (SUBLIMAZE) injection  50 mcg Intravenous Once  . haloperidol lactate  5 mg Intravenous BID  . hydrocortisone sod succinate (SOLU-CORTEF) inj  50 mg Intravenous Q6H  . insulin aspart  0-9 Units Subcutaneous 6 times per day  . ipratropium-albuterol  3 mL Nebulization TID  . pantoprazole sodium  40 mg Per Tube BID  . piperacillin-tazobactam (ZOSYN)  IV  2.25 g Intravenous Q8H  . pneumococcal 23 valent vaccine  0.5 mL Intramuscular Tomorrow-1000   fentaNYL, fentaNYL (SUBLIMAZE) injection, haloperidol lactate, heparin, heparin, hydrALAZINE, iodixanol, iohexol, ipratropium-albuterol, ondansetron (ZOFRAN) IV, sodium chloride  Assessment/ Plan:  Pt is a 80 y.o. yo female who was admitted on 10/07/2015 with hemorrhagic shock/PEA arrest after PAD intervention with  resultant AKI - prolonged hospital course    1. AKI- due to contrast nephropathy and ATN due to hemorrhagic shock and PEA arrest- had been CRRT dependent from 11/4-11/7, then IHD on 11/9 and 11/11. Baseline creatinine in September was 1.07. volume  Seems a little increased  No dialysis since Friday Also CK a little up- maybe  an element of rhabdo.  Will place pateint back on CRRT today for more aggressive volume removal 2. HTN/vol=-  Appears clinically to be developing worsening edema  Will try to aggressively control volume with a view to extubation 3. Anemia- after hemorrhagic shock- RPH- hgb dropping slowly- added darbe but likely will need transfusion- s/p one unit given 11/11 4. Elytes- hyperkalemia- corrected with HD 5. VDRF/chest tubes- per CCM 6. Fever- per CCM- now on zosyn and vanc, possibly due to hematoma- wbc up today, fever is back- HD cath in 10 days will follow  Will need consideration of tunneled catheter 7. PAD- dry gangrene of toes stable Mitchelle Goerner W    [phone]  @TODAY @10 :59 AM

## 2015-10-10 NOTE — Progress Notes (Signed)
Nutrition Follow-up  DOCUMENTATION CODES:   Not applicable  INTERVENTION:    Re-attempt trophic tube feedings with Vital AF 1.2 at 10 ml/h maybe tomorrow.  NUTRITION DIAGNOSIS:   Inadequate oral intake related to inability to eat as evidenced by NPO status.  Ongoing  GOAL:   Patient will meet greater than or equal to 90% of their needs  Unmet  MONITOR:   TF tolerance, Vent status, Labs, Weight trends, I & O's  REASON FOR ASSESSMENT:   Consult Enteral/tube feeding initiation and management  ASSESSMENT:   80 year old female who [facility]/2015 for iliac artery stent placement. Procedure was done, no stent needed. Post-operatively she complained of nausea and had hypotension. Distended abdomen. CT with with retroperitoneal blood, not actively bleeding per Dr. Bonnielee HaffHoss.  Discussed patient in ICU rounds and with RN today. CRRT is being resumed today. Overall goal is to remove fluid with dialysis for eventual one way extubation. Suggest hold off on starting TPN due to ongoing fluid retention. Patient was receiving TF via jejunal tube (IR placed small bore NGT into the jejunum--placement confirmed on 11/13). She did not tolerate TF with documented emesis of 1300 ml on 11/13 at noon. TF on hold since that time. Patient has not tolerated TF since admission for more than a few hours.   Patient is currently intubated on ventilator support MV: 6 L/min Temp (24hrs), Avg:98.5 F (36.9 C), Min:96.1 F (35.6 C), Max:99.9 F (37.7 C)   Diet Order:   NPO  Skin:  Reviewed, no issues  Last BM:  11/12  Height:   Ht Readings from Last 1 Encounters:  10/21/2015 5' (1.524 m)    Weight:   Wt Readings from Last 1 Encounters:  10/10/15 140 lb 3.4 oz (63.6 kg)    Ideal Body Weight:  45.5 kg  BMI:  Body mass index is 27.38 kg/(m^2).  Estimated Nutritional Needs:   Kcal:  1200  Protein:  80-90 gm  Fluid:  1.5 L  EDUCATION NEEDS:   No  education needs identified at this time  Joaquin CourtsKimberly Harris, RD, LDN, CNSC Pager [phone] After Hours Pager [phone]

## 2015-10-10 NOTE — Progress Notes (Addendum)
ANTIBIOTIC CONSULT NOTE - INITIAL  Pharmacy Consult for vanc/zosyn switched to Ancef Indication: PNA  Allergies  Allergen Reactions  . Meloxicam     GI upset  . Meperidine Hcl     GI upset    Patient Measurements: Height: 5' (152.4 cm) Weight: 140 lb 3.4 oz (63.6 kg) IBW/kg (Calculated) : 45.5  Vital Signs: Temp: 97.3 F (36.3 C) (11/14 1100) Temp Source: Core (Comment) (11/14 0910) BP: 136/62 mmHg (11/14 1100) Pulse Rate: 85 (11/14 1100) Intake/Output from previous day: 11/13 0701 - 11/14 0700 In: 1326.4 [I.V.:916.4; NG/GT:260; IV Piggyback:150] Out: 1603 [Urine:95; Emesis/NG output:1500; Chest Tube:8] Intake/Output from this shift: Total I/O In: 123 [I.V.:123] Out: 125 [Other:125]  Labs:  Recent Labs  10/07/15 1535 10/08/15 0532 10/08/15 0533 10/09/15 0316 10/10/15 0400  WBC 25.7* 20.8*  --  26.6*  --   HGB 9.7* 8.8*  --  9.3*  --   PLT 151 149*  --  188  --   CREATININE  --   --  2.72* 3.88* 5.00*   Estimated Creatinine Clearance: 7.7 mL/min (by C-G formula based on Cr of 5). No results for input(s): VANCOTROUGH, VANCOPEAK, VANCORANDOM, GENTTROUGH, GENTPEAK, GENTRANDOM, TOBRATROUGH, TOBRAPEAK, TOBRARND, AMIKACINPEAK, AMIKACINTROU, AMIKACIN in the last 72 hours.   Microbiology: Recent Results (from the past 720 hour(s))  MRSA PCR Screening     Status: None   Collection Time: 03/11/2015  5:16 PM  Result Value Ref Range Status   MRSA by PCR NEGATIVE NEGATIVE Final    Comment:        The GeneXpert MRSA Assay (FDA approved for NASAL specimens only), is one component of a comprehensive MRSA colonization surveillance program. It is not intended to diagnose MRSA infection nor to guide or monitor treatment for MRSA infections.   Culture, blood (routine x 2)     Status: None   Collection Time: 10/05/15 10:25 AM  Result Value Ref Range Status   Specimen Description BLOOD RIGHT HAND  Final   Special Requests AEROBIC BOTTLE ONLY 6CC  Final   Culture  Setup  Time   Final    GRAM POSITIVE COCCI IN CLUSTERS AEROBIC BOTTLE ONLY CRITICAL RESULT CALLED TO, READ BACK BY AND VERIFIED WITH: L ROBBINS RN 1555 10/06/15 A BROWNING    Culture   Final    STAPHYLOCOCCUS SPECIES (COAGULASE NEGATIVE) THE SIGNIFICANCE OF ISOLATING THIS ORGANISM FROM A SINGLE SET OF BLOOD CULTURES WHEN MULTIPLE SETS ARE DRAWN IS UNCERTAIN. PLEASE NOTIFY THE MICROBIOLOGY DEPARTMENT WITHIN ONE WEEK IF SPECIATION AND SENSITIVITIES ARE REQUIRED.    Report Status 10/08/2015 FINAL  Final  Culture, blood (routine x 2)     Status: None (Preliminary result)   Collection Time: 10/05/15 10:30 AM  Result Value Ref Range Status   Specimen Description BLOOD RIGHT ANTECUBITAL  Final   Special Requests IN PEDIATRIC BOTTLE 3CC  Final   Culture NO GROWTH 4 DAYS  Final   Report Status PENDING  Incomplete  Culture, respiratory (NON-Expectorated)     Status: None   Collection Time: 10/07/15 10:07 PM  Result Value Ref Range Status   Specimen Description TRACHEAL ASPIRATE  Final   Special Requests Normal  Final   Gram Stain   Final    ABUNDANT WBC PRESENT,BOTH PMN AND MONONUCLEAR RARE SQUAMOUS EPITHELIAL CELLS PRESENT ABUNDANT GRAM POSITIVE COCCI IN PAIRS IN CLUSTERS FEW GRAM NEGATIVE RODS Performed at [facility]    Culture   Final    MODERATE STAPHYLOCOCCUS AUREUS Note: RIFAMPIN AND GENTAMICIN  SHOULD NOT BE USED AS SINGLE DRUGS FOR TREATMENT OF STAPH INFECTIONS. This organism is presumed to be Clindamycin resistant based on detection of inducible Clindamycin resistance. Performed at [facility]    Report Status 10/10/2015 FINAL  Final   Organism ID, Bacteria STAPHYLOCOCCUS AUREUS  Final      Susceptibility   Staphylococcus aureus - MIC*    CLINDAMYCIN RESISTANT      ERYTHROMYCIN >=8 RESISTANT Resistant     GENTAMICIN <=0.5 SENSITIVE Sensitive     LEVOFLOXACIN 0.25 SENSITIVE Sensitive     OXACILLIN 0.5 SENSITIVE Sensitive     RIFAMPIN <=0.5 SENSITIVE Sensitive      TRIMETH/SULFA <=10 SENSITIVE Sensitive     VANCOMYCIN <=0.5 SENSITIVE Sensitive     TETRACYCLINE <=1 SENSITIVE Sensitive     MOXIFLOXACIN <=0.25 SENSITIVE Sensitive     * MODERATE STAPHYLOCOCCUS AUREUS    Medical History: Past Medical History  Diagnosis Date  . ANEMIA 09/25/2010  . DEPRESSION 12/02/2009  . ESSENTIAL HYPERTENSION 02/10/2010  . ASTHMA 12/02/2009  . COPD 10/03/2010  . PEPTIC ULCER DISEASE 10/23/2010  . NEPHROLITHIASIS 12/02/2009  . OSTEOARTHRITIS 01/27/2010  . Pyloric stenosis   . Peptic ulcer, unspecified site, unspecified as acute or chronic, without mention of hemorrhage, perforation, or obstruction   . Gastritis     Medications:  Scheduled:  . sodium chloride   Intravenous Once  . antiseptic oral rinse  7 mL Mouth Rinse QID  . budesonide  0.25 mg Nebulization Q12H  .  ceFAZolin (ANCEF) IV  2 g Intravenous Q12H  . chlorhexidine gluconate  15 mL Mouth Rinse BID  . darbepoetin (ARANESP) injection - NON-DIALYSIS  100 mcg Subcutaneous Q Thu-1800  . feeding supplement (VITAL AF 1.2 CAL)  1,000 mL Per Tube Q24H  . fentaNYL (SUBLIMAZE) injection  50 mcg Intravenous Once  . haloperidol lactate  5 mg Intravenous BID  . hydrocortisone sod succinate (SOLU-CORTEF) inj  50 mg Intravenous Q6H  . insulin aspart  0-9 Units Subcutaneous 6 times per day  . ipratropium-albuterol  3 mL Nebulization TID  . pantoprazole sodium  40 mg Per Tube BID  . pneumococcal 23 valent vaccine  0.5 mL Intramuscular Tomorrow-1000   Assessment: 80 year old female who presented to [facility] 08-Oct-2015 for iliac artery stent placement. Procedure was done, no stent needed. Post-operatively she complained of nausea and had hypotension. She became unresponsive and code blue was called 11/1.  WBC still elevated 26.6, LA 1.6, PCT trend down 2.75>1.76. Afebrile.   11/14 ancef >> 11/9 zosyn >> 11/14 11/9 vanc >> 11/14 11/11 pre-HD VR: 16  11/9 Blood cx: CNS 1/2 - likely contaminant   11/11 Resp cx: MSSA  Received HD this morning, now getting CRRT. SCr 5.00 this morning, no UOP reported.  Goal of Therapy:  Infection resolution/clinical improvement  Plan:  - Cefazolin 2 g IV q12h with CRRT - Monitor clinical progression, HD schedule, LOT  Greggory Stallion, PharmD Clinical Pharmacy Resident Pager # [phone] 10/10/2015 12:21 PM

## 2015-10-10 NOTE — Progress Notes (Signed)
Patient ID: [name], female   DOB: 05/22/1935, 80 y.o.   MRN: [id number]   Pt still on vent Does follow some commands for me Able to nod yes/no Able to move feet when asked to  B chest tubes in place---followed by PCCM  Bedside dialysis now H/H stable Rt toes are same- gangrenous  Will follow and report to Dr Bonnielee HaffHoss

## 2015-10-11 ENCOUNTER — Telehealth: Payer: Self-pay | Admitting: Family Medicine

## 2015-10-11 ENCOUNTER — Inpatient Hospital Stay (HOSPITAL_COMMUNITY): Payer: Commercial Managed Care - HMO

## 2015-10-11 LAB — RENAL FUNCTION PANEL
ALBUMIN: 2.4 g/dL — AB (ref 3.5–5.0)
ALBUMIN: 2.4 g/dL — AB (ref 3.5–5.0)
ANION GAP: 16 — AB (ref 5–15)
Anion gap: 12 (ref 5–15)
BUN: 41 mg/dL — AB (ref 6–20)
BUN: 24 mg/dL — AB (ref 6–20)
CALCIUM: 8.2 mg/dL — AB (ref 8.9–10.3)
CO2: 25 mmol/L (ref 22–32)
CO2: 23 mmol/L (ref 22–32)
CREATININE: 1.41 mg/dL — AB (ref 0.44–1.00)
Calcium: 8.2 mg/dL — ABNORMAL LOW (ref 8.9–10.3)
Chloride: 102 mmol/L (ref 101–111)
Chloride: 98 mmol/L — ABNORMAL LOW (ref 101–111)
Creatinine, Ser: 2.03 mg/dL — ABNORMAL HIGH (ref 0.44–1.00)
GFR calc Af Amer: 26 mL/min — ABNORMAL LOW (ref >60)
GFR calc non Af Amer: 22 mL/min — ABNORMAL LOW (ref >60)
GFR, EST AFRICAN AMERICAN: 40 mL/min — AB (ref >60)
GFR, EST NON AFRICAN AMERICAN: 35 mL/min — AB (ref >60)
GLUCOSE: 117 mg/dL — AB (ref 65–99)
GLUCOSE: 114 mg/dL — AB (ref 65–99)
PHOSPHORUS: 3.2 mg/dL (ref 2.5–4.6)
POTASSIUM: 4.6 mmol/L (ref 3.5–5.1)
POTASSIUM: 4.3 mmol/L (ref 3.5–5.1)
Phosphorus: 2.8 mg/dL (ref 2.5–4.6)
SODIUM: 139 mmol/L (ref 135–145)
SODIUM: 137 mmol/L (ref 135–145)

## 2015-10-11 LAB — CBC
HEMATOCRIT: 32.2 % — AB (ref 36.0–46.0)
HEMOGLOBIN: 10.6 g/dL — AB (ref 12.0–15.0)
MCH: 29 pg (ref 26.0–34.0)
MCHC: 32.9 g/dL (ref 30.0–36.0)
MCV: 88.2 fL (ref 78.0–100.0)
Platelets: 264 10*3/uL (ref 150–400)
RBC: 3.65 MIL/uL — ABNORMAL LOW (ref 3.87–5.11)
RDW: 18 % — AB (ref 11.5–15.5)
WBC: 41.5 10*3/uL — ABNORMAL HIGH (ref 4.0–10.5)

## 2015-10-11 LAB — MAGNESIUM
MAGNESIUM: 2.3 mg/dL (ref 1.7–2.4)
Magnesium: 2.2 mg/dL (ref 1.7–2.4)

## 2015-10-11 LAB — PHOSPHORUS
Phosphorus: 6 mg/dL — ABNORMAL HIGH (ref 2.5–4.6)
Phosphorus: 4 mg/dL (ref 2.5–4.6)

## 2015-10-11 LAB — BASIC METABOLIC PANEL
ANION GAP: 11 (ref 5–15)
BUN: 28 mg/dL — ABNORMAL HIGH (ref 6–20)
CHLORIDE: 104 mmol/L (ref 101–111)
CO2: 22 mmol/L (ref 22–32)
Calcium: 7.3 mg/dL — ABNORMAL LOW (ref 8.9–10.3)
Creatinine, Ser: 1.57 mg/dL — ABNORMAL HIGH (ref 0.44–1.00)
GFR calc Af Amer: 35 mL/min — ABNORMAL LOW (ref >60)
GFR, EST NON AFRICAN AMERICAN: 30 mL/min — AB (ref >60)
GLUCOSE: 110 mg/dL — AB (ref 65–99)
POTASSIUM: 4.1 mmol/L (ref 3.5–5.1)
Sodium: 137 mmol/L (ref 135–145)

## 2015-10-11 LAB — GLUCOSE, CAPILLARY
GLUCOSE-CAPILLARY: 88 mg/dL (ref 65–99)
Glucose-Capillary: 102 mg/dL — ABNORMAL HIGH (ref 65–99)
Glucose-Capillary: 104 mg/dL — ABNORMAL HIGH (ref 65–99)
Glucose-Capillary: 104 mg/dL — ABNORMAL HIGH (ref 65–99)
Glucose-Capillary: 108 mg/dL — ABNORMAL HIGH (ref 65–99)
Glucose-Capillary: 108 mg/dL — ABNORMAL HIGH (ref 65–99)

## 2015-10-11 LAB — POCT ACTIVATED CLOTTING TIME
ACTIVATED CLOTTING TIME: 140 s
ACTIVATED CLOTTING TIME: 190 s
Activated Clotting Time: 128 seconds
Activated Clotting Time: 183 seconds
Activated Clotting Time: 165 seconds

## 2015-10-11 LAB — PROTIME-INR
INR: 1.31 (ref 0.00–1.49)
Prothrombin Time: 16.4 seconds — ABNORMAL HIGH (ref 11.6–15.2)

## 2015-10-11 MED ORDER — HEPARIN SODIUM (PORCINE) 1000 UNIT/ML DIALYSIS: 1000.0000 [IU] | INTRAMUSCULAR | Status: DC | PRN

## 2015-10-11 MED ORDER — VITAL AF 1.2 CAL PO LIQD: 1000.0000 mL | ORAL | Status: DC

## 2015-10-11 MED ORDER — VITAL AF 1.2 CAL PO LIQD
1000.0000 mL | ORAL | Status: DC
  Administered 2015-10-11: 1000 mL
  Filled 2015-10-11 (×2): qty 1000

## 2015-10-11 MED ORDER — SODIUM CHLORIDE 0.9 % IJ SOLN
250.0000 [IU]/h | INTRAMUSCULAR | Status: DC
  Administered 2015-10-11: 250 [IU]/h via INTRAVENOUS_CENTRAL
  Administered 2015-10-12: 800 [IU]/h via INTRAVENOUS_CENTRAL
  Filled 2015-10-11 (×2): qty 2

## 2015-10-11 MED ORDER — HEPARIN BOLUS VIA INFUSION (CRRT)
1000.0000 [IU] | INTRAVENOUS | Status: DC | PRN
  Administered 2015-10-11: 1000 [IU] via INTRAVENOUS_CENTRAL
  Filled 2015-10-11 (×2): qty 1000

## 2015-10-11 NOTE — Telephone Encounter (Signed)
Pt in [place]  Need to know if she had   PNA vaccine with in last 5 years. Please call daughter back on cell #

## 2015-10-11 NOTE — Progress Notes (Signed)
[name]   Subjective:   Interval History:  Poor urine output   3 L negative and improved respiratory dynamics and hoping to wean from ventilator  Objective:  Vital signs in last 24 hours:  Temp:  [94.6 F (34.8 C)-97.9 F (36.6 C)] 97.5 F (36.4 C) (11/15 1030) Pulse Rate:  [43-123] 85 (11/15 1030) Resp:  [11-41] 32 (11/15 1030) BP: (93-191)/(45-149) 117/66 mmHg (11/15 1030) SpO2:  [91 %-100 %] 100 % (11/15 1030) Arterial Line BP: (96-158)/(35-66) 103/47 mmHg (11/15 1030) FiO2 (%):  [30 %] 30 % (11/15 0900) Weight:  [57.9 kg (127 lb 10.3 oz)] 57.9 kg (127 lb 10.3 oz) (11/15 0408)  Weight change: -5.7 kg (-12 lb 9.1 oz) Filed Weights   10/09/15 0200 10/10/15 0338 10/11/15 0408  Weight: 59.7 kg (131 lb 9.8 oz) 63.6 kg (140 lb 3.4 oz) 57.9 kg (127 lb 10.3 oz)    Intake/Output: I/O last 3 completed shifts: In: 1903.1 [I.V.:1703.1; NG/GT:50; IV Piggyback:150] Out: 4498 [Urine:89; Emesis/NG output:250; ZOXWR:6045; Chest Tube:93]   Intake/Output this shift:  Total I/O In: 168.1 [I.V.:98.1; NG/GT:20; IV Piggyback:50] Out: 812 [Urine:4; Other:748; Chest Tube:60]  General: sedated  HEENT: No thryomegaly, JVD not visualized Cardiovascular: RRR no rubs  Lungs: bilateral chest tubes in place Clear with some scattered rhonchi Abdomen: Soft, + hypoactive BS. Abdominal bandages in place No edema  Skin: Stable Necrotic appearing R toe   Basic Metabolic Panel:  Recent Labs Lab 10/08/15 0533  10/09/15 0316 10/09/15 1525 10/10/15 0400 10/10/15 1552 10/10/15 1630 10/11/15 0325  NA 139  --  139  --  140  --  143 139  K 3.6  --  4.1  --  4.6  --  4.2 4.6  CL 103  --  104  --  105  --  103 102  CO2 24  --  21*  --  20*  --  23 25  GLUCOSE 166*  --  139*  --  125*  --  104* 117*  BUN 52*  --  91*  --  128*  --  82* 41*  CREATININE 2.72*  --  3.88*  --  5.00*  --  3.49* 2.03*  CALCIUM 7.8*  --  7.7*  --  7.8*  --  8.2* 8.2*  MG 1.9  <  > 2.0 1.9 2.1 2.0  --  2.3  PHOS 2.6  < > 3.0 3.6 5.3*  5.3* 3.9 4.0 6.0*  3.2  < > = values in this interval not displayed.  Liver Function Tests:  Recent Labs Lab 10/05/15 0410  10/09/15 0316 10/09/15 1045 10/10/15 0400 10/10/15 1630 10/11/15 0325  AST 74*  --   --  41  --   --   --   ALT 74*  --   --  62*  --   --   --   ALKPHOS 182*  --   --  119  --   --   --   BILITOT 10.3*  --   --  4.3*  --   --   --   PROT 5.3*  --   --  5.5*  --   --   --   ALBUMIN 2.1*  < > 2.0* 1.9* 1.7* 2.1* 2.4*  < > = values in this interval not displayed. No results for input(s): LIPASE, AMYLASE in the last 168 hours. No results for input(s): AMMONIA in the last 168 hours.  CBC:  Recent Labs Lab  10/07/15 0349 10/07/15 1535 10/08/15 0532 10/09/15 0316 10/11/15 0325  WBC 16.8* 25.7* 20.8* 26.6* 41.5*  HGB 6.9* 9.7* 8.8* 9.3* 10.6*  HCT 20.7* 27.8* 25.9* 27.7* 32.2*  MCV 87.0 82.5 84.1 86.3 88.2  PLT 139* 151 149* 188 264    Cardiac Enzymes: No results for input(s): CKTOTAL, CKMB, CKMBINDEX, TROPONINI in the last 168 hours.  BNP: Invalid input(s): POCBNP  CBG:  Recent Labs Lab 10/10/15 1549 10/10/15 1903 10/10/15 2334 10/11/15 0329 10/11/15 0810  GLUCAP 100* 112* 108* 104* 88    Microbiology: Results for orders placed or performed during the hospital encounter of 10/26/2015  MRSA PCR Screening     Status: None   Collection Time: 09/29/2015  5:16 PM  Result Value Ref Range Status   MRSA by PCR NEGATIVE NEGATIVE Final    Comment:        The GeneXpert MRSA Assay (FDA approved for NASAL specimens only), is one component of a comprehensive MRSA colonization surveillance program. It is not intended to diagnose MRSA infection nor to guide or monitor treatment for MRSA infections.   Culture, blood (routine x 2)     Status: None   Collection Time: 10/05/15 10:25 AM  Result Value Ref Range Status   Specimen Description BLOOD RIGHT HAND  Final   Special Requests AEROBIC  BOTTLE ONLY 6CC  Final   Culture  Setup Time   Final    GRAM POSITIVE COCCI IN CLUSTERS AEROBIC BOTTLE ONLY CRITICAL RESULT CALLED TO, READ BACK BY AND VERIFIED WITH: L ROBBINS RN 1555 10/06/15 A BROWNING    Culture   Final    STAPHYLOCOCCUS SPECIES (COAGULASE NEGATIVE) THE SIGNIFICANCE OF ISOLATING THIS ORGANISM FROM A SINGLE SET OF BLOOD CULTURES WHEN MULTIPLE SETS ARE DRAWN IS UNCERTAIN. PLEASE NOTIFY THE MICROBIOLOGY DEPARTMENT WITHIN ONE WEEK IF SPECIATION AND SENSITIVITIES ARE REQUIRED.    Report Status 10/08/2015 FINAL  Final  Culture, blood (routine x 2)     Status: None   Collection Time: 10/05/15 10:30 AM  Result Value Ref Range Status   Specimen Description BLOOD RIGHT ANTECUBITAL  Final   Special Requests IN PEDIATRIC BOTTLE 3CC  Final   Culture NO GROWTH 5 DAYS  Final   Report Status 10/10/2015 FINAL  Final  Culture, respiratory (NON-Expectorated)     Status: None   Collection Time: 10/07/15 10:07 PM  Result Value Ref Range Status   Specimen Description TRACHEAL ASPIRATE  Final   Special Requests Normal  Final   Gram Stain   Final    ABUNDANT WBC PRESENT,BOTH PMN AND MONONUCLEAR RARE SQUAMOUS EPITHELIAL CELLS PRESENT ABUNDANT GRAM POSITIVE COCCI IN PAIRS IN CLUSTERS FEW GRAM NEGATIVE RODS Performed at [facility]    Culture   Final    MODERATE STAPHYLOCOCCUS AUREUS Note: RIFAMPIN AND GENTAMICIN SHOULD NOT BE USED AS SINGLE DRUGS FOR TREATMENT OF STAPH INFECTIONS. This organism is presumed to be Clindamycin resistant based on detection of inducible Clindamycin resistance. Performed at [facility]    Report Status 10/10/2015 FINAL  Final   Organism ID, Bacteria STAPHYLOCOCCUS AUREUS  Final      Susceptibility   Staphylococcus aureus - MIC*    CLINDAMYCIN RESISTANT      ERYTHROMYCIN >=8 RESISTANT Resistant     GENTAMICIN <=0.5 SENSITIVE Sensitive     LEVOFLOXACIN 0.25 SENSITIVE Sensitive     OXACILLIN 0.5 SENSITIVE Sensitive     RIFAMPIN <=0.5  SENSITIVE Sensitive     TRIMETH/SULFA <=10 SENSITIVE Sensitive  VANCOMYCIN <=0.5 SENSITIVE Sensitive     TETRACYCLINE <=1 SENSITIVE Sensitive     MOXIFLOXACIN <=0.25 SENSITIVE Sensitive     * MODERATE STAPHYLOCOCCUS AUREUS    Coagulation Studies:  Recent Labs  10/11/15 0325  LABPROT 16.4*  INR 1.31    Urinalysis: No results for input(s): COLORURINE, LABSPEC, PHURINE, GLUCOSEU, HGBUR, BILIRUBINUR, KETONESUR, PROTEINUR, UROBILINOGEN, NITRITE, LEUKOCYTESUR in the last 72 hours.  Invalid input(s): APPERANCEUR    Imaging: Dg Chest Port 1 View  10/09/2015  CLINICAL DATA:  80 year old female with acute respiratory failure. EXAM: PORTABLE CHEST 1 VIEW COMPARISON:  10/07/2015 and prior studies FINDINGS: Cardiomediastinal silhouette is unchanged. An endotracheal tube with tip 1 cm above the carina, NG tube with tip overlying the peripyloric region, small bore feeding tube in the jejunum with tip off the field of view, right IJ central venous catheter with tip overlying the superior cavoatrial junction, left IJ central venous catheter sheath and bilateral thoracostomy tubes again noted. There is no evidence of pneumothorax. Mild bibasilar atelectasis again identified in this mildly low volume film. IMPRESSION: Unchanged appearance of the chest with support apparatus as described. No evidence of pneumothorax. Continued mild bibasilar atelectasis. Electronically Signed   By: Harmon PierJeffrey  Hu M.D.   On: 10/09/2015 15:27   Dg Abd Portable 1v  10/09/2015  CLINICAL DATA:  Evaluate OG tube placement. EXAM: PORTABLE ABDOMEN - 1 VIEW COMPARISON:  10/06/2015 and prior exams FINDINGS: A small bore feeding tube is identified with tip overlying the proximal jejunum. An OG tube is present with tip overlying the peripyloric region. Surgical staples overlying the abdomen and pelvis are noted. No dilated bowel loops are identified. IMPRESSION: OG tube with tip overlying the peripyloric region. Small bore feeding  tube with tip overlying the proximal jejunum. Electronically Signed   By: Harmon PierJeffrey  Hu M.D.   On: 10/09/2015 15:34     Medications:   . sodium chloride 10 mL/hr at 10/10/15 1900  . dexmedetomidine 0.7 mcg/kg/hr (10/11/15 1047)  . fentaNYL infusion INTRAVENOUS 50 mcg/hr (10/11/15 0816)  . phenylephrine (NEO-SYNEPHRINE) Adult infusion 60 mcg/min (10/11/15 1042)  . dialysis replacement fluid (prismasate) 200 mL/hr at 10/10/15 1334  . dialysis replacement fluid (prismasate) 300 mL/hr at 10/11/15 0654  . dialysate (PRISMASATE) 2,000 mL/hr at 10/11/15 0750   . sodium chloride   Intravenous Once  . antiseptic oral rinse  7 mL Mouth Rinse QID  . budesonide  0.25 mg Nebulization Q12H  .  ceFAZolin (ANCEF) IV  2 g Intravenous Q12H  . chlorhexidine gluconate  15 mL Mouth Rinse BID  . darbepoetin (ARANESP) injection - NON-DIALYSIS  100 mcg Subcutaneous Q Thu-1800  . feeding supplement (VITAL AF 1.2 CAL)  1,000 mL Per Tube Q24H  . fentaNYL (SUBLIMAZE) injection  50 mcg Intravenous Once  . haloperidol lactate  5 mg Intravenous BID  . hydrocortisone sod succinate (SOLU-CORTEF) inj  50 mg Intravenous Q6H  . insulin aspart  0-9 Units Subcutaneous 6 times per day  . ipratropium-albuterol  3 mL Nebulization TID  . pantoprazole sodium  40 mg Per Tube BID  . pneumococcal 23 valent vaccine  0.5 mL Intramuscular Tomorrow-1000   fentaNYL, fentaNYL (SUBLIMAZE) injection, haloperidol lactate, heparin, heparin, hydrALAZINE, iodixanol, iohexol, ipratropium-albuterol, ondansetron (ZOFRAN) IV, sodium chloride  Assessment/ Plan:  Pt is a 80 y.o. yo female who was admitted on 10/16/2015 with hemorrhagic shock/PEA arrest after PAD intervention with resultant AKI - prolonged hospital course   1. AKI- due to contrast nephropathy and ATN due to hemorrhagic  shock and PEA arrest- had been CRRT dependent from 11/4-11/7, then IHD on 11/9 and 11/11. Baseline creatinine in September was 1.07. volume Seems a little  increased No dialysis since Friday Also CK a little up- maybe an element of rhabdo. tolerating CRRT,  started again 11/14 2. HTN/vol=- Much better lung dynamics  3. Anemia- after hemorrhagic shock- RPH- - added darbe but likely will need transfusion- s/p one unit given 11/11   Hb 10's  4. Elytes- hyperkalemia- corrected with HD 5. VDRF/chest tubes- per CCM 6. Fever- per CCM- now on zosyn and vanc, possibly due to hematoma- wbc  Now 41 K , fever is back- HD cath in 11 days will follow Will need consideration of tunneled catheter 7. PAD- dry gangrene of toes stable  [name] Herman [phone]     LOS: 14 Dewana Ammirati Herman  :47 AM

## 2015-10-11 NOTE — Progress Notes (Signed)
Nutrition Follow-up  DOCUMENTATION CODES:   Not applicable  INTERVENTION:   Resume trophic feedings with Vital AF 1.2 at 10 ml/h via J-tube, do not advance today.  NUTRITION DIAGNOSIS:   Inadequate oral intake related to inability to eat as evidenced by NPO status.  Ongoing  GOAL:   Patient will meet greater than or equal to 90% of their needs  Unmet  MONITOR:   TF tolerance, Vent status, Labs, Weight trends, I & O's  ASSESSMENT:   80 year old female who [facility] 10/25/2015 for iliac artery stent placement. Procedure was done, no stent needed. Post-operatively she complained of nausea and had hypotension. Distended abdomen. CT with with retroperitoneal blood, not actively bleeding per Dr. Bonnielee HaffHoss.  Patient remains intubated. Per discussion with RN, CCM would like to attempt trophic TF at 10 ml/h; RD will order. TF off since Sunday due to emesis, OGT was placed with 1300 ml output. NGT in place with tip in the jejunum.   Diet Order:   NPO  Skin:  Reviewed, no issues  Last BM:  11/12  Height:   Ht Readings from Last 1 Encounters:  10/20/2015 5' (1.524 m)    Weight:   Wt Readings from Last 1 Encounters:  10/11/15 127 lb 10.3 oz (57.9 kg)    Ideal Body Weight:  45.5 kg  BMI:  Body mass index is 24.93 kg/(m^2).  Estimated Nutritional Needs:   Kcal:  1200  Protein:  80-90 gm  Fluid:  1.5 L  EDUCATION NEEDS:   No education needs identified at this time   Joaquin CourtsKimberly Pervis Macintyre, RD, LDN, CNSC Pager [phone] After Hours Pager [phone]

## 2015-10-11 NOTE — Progress Notes (Signed)
**  Critical Care Interval Note**  CRRT in progress. Sister at bedside.  BP 106/61, HR 95 on 30 mcg of Neo.  Continues to be intermittently hypothermic.  Bair hugger in placed.  Still anuric/oligouric.  It appears that family has not made a decision on trach placement/ extubation.  They remain hopeful that she will improve.  Will continue to monitor.  Shamecka Hocutt M. Nadine CountsGottschalk, DO PGY-2, [facility]

## 2015-10-11 NOTE — Progress Notes (Signed)
PULMONARY / CRITICAL CARE MEDICINE   Name: [name] Herman MRN: [id number] DOB: 11/09/1935    ADMISSION DATE:  10/08/2015 CONSULTATION DATE:  10/04/2015  REFERRING MD :  Dr. Bonnielee HaffHoss, IR  CHIEF COMPLAINT:  RP bleeding after IR procedure  INITIAL PRESENTATION: 80 year old female who [facility] 10/25/2015 for iliac artery stent placement. Procedure was done, no stent needed. Post-operatively she complained of nausea and had hypotension. Distended abdomen. CT with with retroperitoneal blood, not actively bleeding per Dr. Bonnielee HaffHoss. To ICU for PCCM management.   STUDIES:  CT (A) abd/pelvis (11/1) >>> Large right retroperitoneal hemorrhage extending from the right inguinal region after angiogram. There is no active extravasation or bleeding. There is no pseudoaneurysm. Lower extremity arterial duplex (11/1) No evidence of psuedoaneurysm in the right groin. A hematoma is noted measuring 3.55cm. CXR (11/3): No significant change in left lower lobe atelectasis or consolidation and mild bilateral perihilar atelectasis CXR (11/4): Endotracheal tube tip remains at the carina. Proximal repositioning of approximately 2 to 3 cm suggested. Remaining lines and tubes in stable position. Persistent bibasilar atelectasis. Mild infiltrate left lung base cannot be excluded. CXR (11/7): 1. Low endotracheal tube with tip 5 mm above the carina. 2. Mild improvement basilar lung aeration with residual retrocardiac atelectasis or pneumonia. CXR (11/8): Tube and catheter positions as described without pneumothorax. Note that the endotracheal tube tip is essentially at the carina. It may be prudent to consider withdrawing endotracheal tube 2-3 cm CXR (11/9): lines stable. Patchy bibasilar pulmonary infiltrates. KUB (11/9): coiled feeding tube CXR (11/11): 1. Bilateral chest tube placement with decreased bilateral pneumothoraces, question tiny persistent right apical component on the right. Complete  resolution on the left.  Endotracheal tube is now 1 cm from the carina, consider retraction of 1-2 cm.  Subcutaneous emphysema in both supraclavicular regions. Probable pneumomediastinum at the thoracic inlet. Linear air again seen in the upper abdomen, location uncertain.   SIGNIFICANT EVENTS: 11/1 to IR for iliac stent, stent not needed, post op RP blood. Shock. To ICU 11/1 coded pea 10 min , OR for evacuation hematoma and iliac repair  11/2- GI bleeding, Total:PRBC 17; FFP 3; Cryo 11 renal called 11/3- less bleeding from OGT, no pressors 11/4- HD transitioned to CVVH 11/7- CRRT stopped 11/9- intermittent HD started 11/11- pneumothorax, chest tube placement 11/12 toe ischemia as below. 11/13 no pressors. Vomiting. Gastric distention  11/15 Pressor support, back on CRRT   SUBJECTIVE:  Chronically ill appearing female, sedated on vent   VITAL SIGNS: Temp:  [94.6 F (34.8 C)-97.9 F (36.6 C)] 97.5 F (36.4 C) (11/15 0900) Pulse Rate:  [43-123] 120 (11/15 0900) Resp:  [11-41] 34 (11/15 0900) BP: (93-181)/(45-136) 123/57 mmHg (11/15 0900) SpO2:  [91 %-100 %] 100 % (11/15 0900) Arterial Line BP: (96-158)/(35-66) 118/53 mmHg (11/15 0900) FiO2 (%):  [30 %] 30 % (11/15 0900) Weight:  [57.9 kg (127 lb 10.3 oz)] 57.9 kg (127 lb 10.3 oz) (11/15 0408) HEMODYNAMICS: CVP:  [3 mmHg-8 mmHg] 4 mmHg VENTILATOR SETTINGS: Vent Mode:  [-] PRVC FiO2 (%):  [30 %] 30 % Set Rate:  [10 bmp] 10 bmp Vt Set:  [420 mL] 420 mL PEEP:  [5 cmH20] 5 cmH20 Pressure Support:  [5 cmH20] 5 cmH20 Plateau Pressure:  [17 cmH20] 17 cmH20 INTAKE / OUTPUT:  Intake/Output Summary (Last 24 hours) at 10/11/15 1025 Last data filed at 10/11/15 1000  Gross per 24 hour  Intake 1356.2 ml  Output   5015 ml  Net -3658.8 ml    PHYSICAL EXAMINATION:  General: sedated. Intubated. Neuro: PERRLA, pupils, pupils 1.5 mm b/l HEENT: No thryomegaly, JVD Cardiovascular:  S1S2, RRR Lungs: Clear to auscultation bilaterally,  intubated, b/l chest tubes in place; no airleak Abdomen:  Soft, non-tender, hypoactive bs. Abdominal bandages in place Musculoskeletal:  Lower extremities with 1+, LE pulses difficult to palpate Skin:  Stable Necrotic appearing R toes, see photo below  Photos (10/03/05)      LABS:  CBC  Recent Labs Lab 10/08/15 0532 10/09/15 0316 10/11/15 0325  WBC 20.8* 26.6* 41.5*  HGB 8.8* 9.3* 10.6*  HCT 25.9* 27.7* 32.2*  PLT 149* 188 264   Coag's  Recent Labs Lab 10/06/15 0830 10/11/15 0325  INR 1.56* 1.31   BMET  Recent Labs Lab 10/10/15 0400 10/10/15 1630 10/11/15 0325  NA 140 143 139  K 4.6 4.2 4.6  CL 105 103 102  CO2 20* 23 25  BUN 128* 82* 41*  CREATININE 5.00* 3.49* 2.03*  GLUCOSE 125* 104* 117*   Electrolytes  Recent Labs Lab 10/10/15 0400 10/10/15 1552 10/10/15 1630 10/11/15 0325  CALCIUM 7.8*  --  8.2* 8.2*  MG 2.1 2.0  --  2.3  PHOS 5.3*  5.3* 3.9 4.0 6.0*  3.2   Sepsis Markers  Recent Labs Lab 10/08/15 0532 10/09/15 0316 10/10/15 0400  PROCALCITON 3.45 2.75 1.76   ABG  Recent Labs Lab 10/07/15 2259 10/08/15 0335 10/10/15 0322  PHART 7.513* 7.502* 7.407  PCO2ART 33.7* 31.5* 30.6*  PO2ART 104.0* 139* 123*   Liver Enzymes  Recent Labs Lab 10/05/15 0410  10/09/15 1045 10/10/15 0400 10/10/15 1630 10/11/15 0325  AST 74*  --  41  --   --   --   ALT 74*  --  62*  --   --   --   ALKPHOS 182*  --  119  --   --   --   BILITOT 10.3*  --  4.3*  --   --   --   ALBUMIN 2.1*  < > 1.9* 1.7* 2.1* 2.4*  < > = values in this interval not displayed. Cardiac Enzymes No results for input(s): TROPONINI, PROBNP in the last 168 hours. Glucose  Recent Labs Lab 10/10/15 1203 10/10/15 1549 10/10/15 1903 10/10/15 2334 10/11/15 0329 10/11/15 0810  GLUCAP 100* 100* 112* 108* 104* 88    Imaging No results found. ASSESSMENT / PLAN:  PULMONARY A: Acute resp failure, hypoxia s/p PEA arrest  COPD  Spontaneous bilateral pneumothorax,  now s/p bilateral CT >failing weaning efforts.  P:   Fentanyl, wean as able Precedex CRRT resumed 11/15  F/u CXR in AM    CARDIOVASCULAR CVL LIJ sheath 11/1 >>>  ALINE left fem 11/1>>> A:  Hemorrhagic shock resolved Ischemic toes  PAD H/o HTN Trop leak, s/p cpr P:  Euvolemic goal  Hold home antihypertensives   RENAL A:   Acute on chronic renal failure, ATN, post arrest , shock, rhabdo -->stable Hypernatremia, improved Hyperkalemia, improved Hypophos, improved P:   CRRT per renal  Daily BMET Replete electrolytes as needed   GASTROINTESTINAL A:   H/o PUD abd pain and gastric reflux vs ileus  P:   Restart TF- advance as tolerated  PPI bid  HEMATOLOGIC A:   Acute retroperitoneal hemorrhage s/p IR arteriogram  Consumptive / dilutional coagulapthy DIC -->resolved PT 16.4, INR 1.31, hgb 10.6, plt 264 P:  CBC to q24 Follow CBC, platelets, coags  INFECTIOUS A:   HCAP -->prelim SA  1/2 coag neg staph Hypothermia-improving 11/15  P:   Blood cx 11/9>> 1/2 coag neg SA Trach aspirate>> Staph aureus  Vanc 11/9>>11/14 Zosyn 11/9>>11/14 Cefazolin 11/14>>  ENDOCRINE A:   Chronic prednisone for polymyalgia rheumatica GI bleed P:   Hydrocortisone to 50 mg q6 Follow glucose on Bmet SSI  NEUROLOGIC A:   Acute metabolic encephalopathy up and down R/o anoxia, post arrest P:   RASS goal: 0 Monitor closely Change scheduled haldol to PRN   FAMILY  - Updates: family updated in full 11/11 - Inter-disciplinary family meet or Palliative Care meeting due by:  11/7   Simonne Martinet ACNP-BC [facility] Pulmonary/Critical Care Pager # [phone] OR # [phone] if no answer  [facility] Pulmonary/Critical Care Medicine. Pager: [phone]. After hours pager: [phone].  10/11/2015, 10:25 AM   Today's summary:  80 yo chronically ill female requiring continued pressor support and CRRT s/p complicated hospital stay including cardiac arrest, RP bleed, bilateral PTX s/p  bilat chest tube placement. Pt at 2 week intubation; goals of care discussed with family 11/14- family does not wish to trach/peg/escalate care at this time. Renal recommends one more day of CRRT with neg fluid balance. F/u bmet, cbc, CXR in AM. Will re-evaluate with family in AM regarding extubation.

## 2015-10-11 NOTE — Progress Notes (Signed)
Pt daughter unsure if pt has had pneumonia vaccine in past 5 years. Daughter will contact GP to find out immunization history. Pneumonia vaccine dose moved to 12-16-14 at 1000

## 2015-10-12 ENCOUNTER — Inpatient Hospital Stay (HOSPITAL_COMMUNITY): Payer: Commercial Managed Care - HMO

## 2015-10-12 LAB — POCT ACTIVATED CLOTTING TIME
ACTIVATED CLOTTING TIME: 177 s
ACTIVATED CLOTTING TIME: 196 s
ACTIVATED CLOTTING TIME: 196 s
ACTIVATED CLOTTING TIME: 202 s
ACTIVATED CLOTTING TIME: 227 s
Activated Clotting Time: 208 seconds
Activated Clotting Time: 196 seconds
Activated Clotting Time: 190 seconds
Activated Clotting Time: 202 seconds
Activated Clotting Time: 208 seconds
Activated Clotting Time: 220 seconds

## 2015-10-12 LAB — GLUCOSE, CAPILLARY
GLUCOSE-CAPILLARY: 131 mg/dL — AB (ref 65–99)
GLUCOSE-CAPILLARY: 136 mg/dL — AB (ref 65–99)
Glucose-Capillary: 129 mg/dL — ABNORMAL HIGH (ref 65–99)

## 2015-10-12 LAB — PHOSPHORUS: Phosphorus: 2.5 mg/dL (ref 2.5–4.6)

## 2015-10-12 LAB — RENAL FUNCTION PANEL
ALBUMIN: 2.6 g/dL — AB (ref 3.5–5.0)
ANION GAP: 14 (ref 5–15)
BUN: 13 mg/dL (ref 6–20)
CO2: 23 mmol/L (ref 22–32)
Calcium: 8.6 mg/dL — ABNORMAL LOW (ref 8.9–10.3)
Chloride: 99 mmol/L — ABNORMAL LOW (ref 101–111)
Creatinine, Ser: 1.17 mg/dL — ABNORMAL HIGH (ref 0.44–1.00)
GFR, EST AFRICAN AMERICAN: 50 mL/min — AB (ref >60)
GFR, EST NON AFRICAN AMERICAN: 43 mL/min — AB (ref >60)
GLUCOSE: 151 mg/dL — AB (ref 65–99)
Phosphorus: 2.6 mg/dL (ref 2.5–4.6)
Potassium: 4.9 mmol/L (ref 3.5–5.1)
SODIUM: 136 mmol/L (ref 135–145)

## 2015-10-12 LAB — CBC
HEMATOCRIT: 34.6 % — AB (ref 36.0–46.0)
HEMOGLOBIN: 11.2 g/dL — AB (ref 12.0–15.0)
MCH: 29.1 pg (ref 26.0–34.0)
MCHC: 32.4 g/dL (ref 30.0–36.0)
MCV: 89.9 fL (ref 78.0–100.0)
Platelets: 251 10*3/uL (ref 150–400)
RBC: 3.85 MIL/uL — ABNORMAL LOW (ref 3.87–5.11)
RDW: 18.4 % — ABNORMAL HIGH (ref 11.5–15.5)
WBC: 52.5 10*3/uL (ref 4.0–10.5)

## 2015-10-12 LAB — MAGNESIUM: Magnesium: 2.4 mg/dL (ref 1.7–2.4)

## 2015-10-12 LAB — PROCALCITONIN: Procalcitonin: 0.84 ng/mL

## 2015-10-12 LAB — APTT: aPTT: 73 seconds — ABNORMAL HIGH (ref 24–37)

## 2015-10-12 MED ORDER — SODIUM CHLORIDE 0.9 % IV SOLN
25.0000 ug/h | INTRAVENOUS | Status: DC
  Filled 2015-10-12: qty 50

## 2015-10-12 MED ORDER — PHENYLEPHRINE HCL 10 MG/ML IJ SOLN
0.0000 ug/min | INTRAVENOUS | Status: DC
  Administered 2015-10-12: 300 ug/min via INTRAVENOUS
  Administered 2015-10-12: 60 ug/min via INTRAVENOUS
  Filled 2015-10-12 (×2): qty 4

## 2015-10-12 MED ORDER — LORAZEPAM BOLUS VIA INFUSION
2.0000 mg | INTRAVENOUS | Status: DC | PRN
  Filled 2015-10-12: qty 5

## 2015-10-12 MED ORDER — FENTANYL BOLUS VIA INFUSION
50.0000 ug | INTRAVENOUS | Status: DC | PRN
  Filled 2015-10-12: qty 200

## 2015-10-12 MED ORDER — LORAZEPAM 2 MG/ML IJ SOLN
1.0000 mg/h | INTRAVENOUS | Status: DC
  Filled 2015-10-12: qty 25

## 2015-10-14 ENCOUNTER — Telehealth: Payer: Self-pay

## 2015-10-14 NOTE — Telephone Encounter (Signed)
On 10/14/2015 I [facility] The death certificate is for burial. The patient is a patient of Doctor Sood. [facility] ([id number] this am for signature. On 10/14/2015 I received the death certificate back from Doctor [place]. I got the death certificate ready for pickup and called the funeral home to let them know the death certificate is ready for pickup.

## 2015-10-16 ENCOUNTER — Other Ambulatory Visit: Payer: Self-pay | Admitting: Family Medicine

## 2015-10-25 ENCOUNTER — Other Ambulatory Visit: Payer: Commercial Managed Care - HMO

## 2015-10-27 NOTE — Progress Notes (Signed)
[facility] ROUNDING NOTE   Subjective:   Interval History:  Patients condition is worsening and have decided on comfort care  Objective:  Vital signs in last 24 hours:  Temp:  [96.3 F (35.7 C)-98.2 F (36.8 C)] 97.2 F (36.2 C) (11/16 1030) Pulse Rate:  [31-110] 31 (11/16 0930) Resp:  [19-41] 27 (11/16 1030) BP: (38-150)/(11-121) 49/15 mmHg (11/16 1015) SpO2:  [62 %-100 %] 62 % (11/16 0930) Arterial Line BP: (43-271)/(29-242) 43/29 mmHg (11/16 1030) FiO2 (%):  [30 %] 30 % (11/16 1000) Weight:  [57 kg (125 lb 10.6 oz)] 57 kg (125 lb 10.6 oz) (11/16 0500)  Weight change: -0.9 kg (-1 lb 15.7 oz) Filed Weights   10/10/15 0338 10/11/15 0408 09/30/2015 0500  Weight: 63.6 kg (140 lb 3.4 oz) 57.9 kg (127 lb 10.3 oz) 57 kg (125 lb 10.6 oz)    Intake/Output: I/O last 3 completed shifts: In: 3045.6 [I.V.:2686.9; NG/GT:208.7; IV Piggyback:150] Out: 8464 [Urine:4; Emesis/NG output:50; UJWJX:9147; Chest Tube:95]   Intake/Output this shift:  Total I/O In: 289.8 [I.V.:239.8; IV Piggyback:50] Out: 801 [Other:801]  General: sedated  HEENT: No thryomegaly, JVD not visualized Cardiovascular: RRR no rubs  Lungs: bilateral chest tubes in place Clear with some scattered rhonchi Abdomen: Soft, + hypoactive BS. Abdominal bandages in place No edema  Skin: Stable Necrotic appearing R toe   Basic Metabolic Panel:  Recent Labs Lab 10/10/15 0400 10/10/15 1552 10/10/15 1630 10/11/15 0325 10/11/15 1441 10/11/15 1600 10/04/2015 0330  NA 140  --  143 139 137 137 136  K 4.6  --  4.2 4.6 4.1 4.3 4.9  CL 105  --  103 102 104 98* 99*  CO2 20*  --  GLUCOSE 125*  --  104* 117* 110* 114* 151*  BUN 128*  --  82* 41* 28* 24* 13  CREATININE 5.00*  --  3.49* 2.03* 1.57* 1.41* 1.17*  CALCIUM 7.8*  --  8.2* 8.2* 7.3* 8.2* 8.6*  MG 2.1 2.0  --  2.3 2.2  --  2.4  PHOS 5.3*  5.3* 3.9 4.0 6.0*  3.2 4.0 2.8 2.5  2.6    Liver Function Tests:  Recent  Labs Lab 10/09/15 1045 10/10/15 0400 10/10/15 1630 10/11/15 0325 10/11/15 1600 10/18/2015 0330  AST 41  --   --   --   --   --   ALT 62*  --   --   --   --   --   ALKPHOS 119  --   --   --   --   --   BILITOT 4.3*  --   --   --   --   --   PROT 5.5*  --   --   --   --   --   ALBUMIN 1.9* 1.7* 2.1* 2.4* 2.4* 2.6*   No results for input(s): LIPASE, AMYLASE in the last 168 hours. No results for input(s): AMMONIA in the last 168 hours.  CBC:  Recent Labs Lab 10/07/15 1535 10/08/15 0532 10/09/15 0316 10/11/15 0325 10/24/2015 0330  WBC 25.7* 20.8* 26.6* 41.5* 52.5*  HGB 9.7* 8.8* 9.3* 10.6* 11.2*  HCT 27.8* 25.9* 27.7* 32.2* 34.6*  MCV 82.5 84.1 86.3 88.2 89.9  PLT 151 149* 188 264 251    Cardiac Enzymes: No results for input(s): CKTOTAL, CKMB, CKMBINDEX, TROPONINI in the last 168 hours.  BNP: Invalid input(s): POCBNP  CBG:  Recent Labs Lab 10/11/15 1559 10/11/15 1923 10/15/2015 0016  10/30/2015 0418 Oct 30, 2015 0801  GLUCAP 102* 108* 136* 131* 129*    Microbiology: Results for orders placed or performed during the hospital encounter of 10/13/2015  MRSA PCR Screening     Status: None   Collection Time: 10/13/2015  5:16 PM  Result Value Ref Range Status   MRSA by PCR NEGATIVE NEGATIVE Final    Comment:        The GeneXpert MRSA Assay (FDA approved for NASAL specimens only), is one component of a comprehensive MRSA colonization surveillance program. It is not intended to diagnose MRSA infection nor to guide or monitor treatment for MRSA infections.   Culture, blood (routine x 2)     Status: None   Collection Time: 10/05/15 10:25 AM  Result Value Ref Range Status   Specimen Description BLOOD RIGHT HAND  Final   Special Requests AEROBIC BOTTLE ONLY 6CC  Final   Culture  Setup Time   Final    GRAM POSITIVE COCCI IN CLUSTERS AEROBIC BOTTLE ONLY CRITICAL RESULT CALLED TO, READ BACK BY AND VERIFIED WITH: L ROBBINS RN 1555 10/06/15 A BROWNING    Culture   Final     STAPHYLOCOCCUS SPECIES (COAGULASE NEGATIVE) THE SIGNIFICANCE OF ISOLATING THIS ORGANISM FROM A SINGLE SET OF BLOOD CULTURES WHEN MULTIPLE SETS ARE DRAWN IS UNCERTAIN. PLEASE NOTIFY THE MICROBIOLOGY DEPARTMENT WITHIN ONE WEEK IF SPECIATION AND SENSITIVITIES ARE REQUIRED.    Report Status 10/08/2015 FINAL  Final  Culture, blood (routine x 2)     Status: None   Collection Time: 10/05/15 10:30 AM  Result Value Ref Range Status   Specimen Description BLOOD RIGHT ANTECUBITAL  Final   Special Requests IN PEDIATRIC BOTTLE 3CC  Final   Culture NO GROWTH 5 DAYS  Final   Report Status 10/10/2015 FINAL  Final  Culture, respiratory (NON-Expectorated)     Status: None   Collection Time: 10/07/15 10:07 PM  Result Value Ref Range Status   Specimen Description TRACHEAL ASPIRATE  Final   Special Requests Normal  Final   Gram Stain   Final    ABUNDANT WBC PRESENT,BOTH PMN AND MONONUCLEAR RARE SQUAMOUS EPITHELIAL CELLS PRESENT ABUNDANT GRAM POSITIVE COCCI IN PAIRS IN CLUSTERS FEW GRAM NEGATIVE RODS Performed at [facility]    Culture   Final    MODERATE STAPHYLOCOCCUS AUREUS Note: RIFAMPIN AND GENTAMICIN SHOULD NOT BE USED AS SINGLE DRUGS FOR TREATMENT OF STAPH INFECTIONS. This organism is presumed to be Clindamycin resistant based on detection of inducible Clindamycin resistance. Performed at [facility]    Report Status 10/10/2015 FINAL  Final   Organism ID, Bacteria STAPHYLOCOCCUS AUREUS  Final      Susceptibility   Staphylococcus aureus - MIC*    CLINDAMYCIN RESISTANT      ERYTHROMYCIN >=8 RESISTANT Resistant     GENTAMICIN <=0.5 SENSITIVE Sensitive     LEVOFLOXACIN 0.25 SENSITIVE Sensitive     OXACILLIN 0.5 SENSITIVE Sensitive     RIFAMPIN <=0.5 SENSITIVE Sensitive     TRIMETH/SULFA <=10 SENSITIVE Sensitive     VANCOMYCIN <=0.5 SENSITIVE Sensitive     TETRACYCLINE <=1 SENSITIVE Sensitive     MOXIFLOXACIN <=0.25 SENSITIVE Sensitive     * MODERATE STAPHYLOCOCCUS AUREUS     Coagulation Studies:  Recent Labs  10/11/15 0325  LABPROT 16.4*  INR 1.31    Urinalysis: No results for input(s): COLORURINE, LABSPEC, PHURINE, GLUCOSEU, HGBUR, BILIRUBINUR, KETONESUR, PROTEINUR, UROBILINOGEN, NITRITE, LEUKOCYTESUR in the last 72 hours.  Invalid input(s): APPERANCEUR    Imaging: Dg  Chest Port 1 View  19-Aug-2015  CLINICAL DATA:  Follow-up pneumothorax, bilateral chest tubes, cardiac arrest, hemorrhagic shock acute respiratory failure. EXAM: PORTABLE CHEST 1 VIEW COMPARISON:  Portable chest x-ray of October 09, 2015 and October 07, 2015. FINDINGS: The lungs are well-expanded. The bilateral chest tubes are unchanged in position. There is no pneumothorax or pleural effusion. The heart and pulmonary vascularity are normal. The mediastinum is normal in width. The endotracheal tube tip projects 2.5 cm above the carina. The esophagogastric tube tip projects below the inferior margin of the image. The dual-lumen large-bore catheter placed via the right internal jugular approach has its tip projecting over the junction of the proximal and midportions of the SVC. There is persistent kinking of the left internal jugular Cordis sheath. IMPRESSION: Marked improvement in the appearance of both lungs. No residual pneumothorax or pleural effusion. No significant interstitial edema or pneumonia. Minimal subsegmental atelectasis at the left lung base persists. The support tubes are in reasonable position. Electronically Signed   By: David  [place]azil[place] M.D.   On: 19-Aug-2015 07:29     Medications:   . sodium chloride 10 mL/hr at 10/11/15 1900  . dexmedetomidine 0.5 mcg/kg/hr (11/15/15 0845)  . fentaNYL infusion INTRAVENOUS 50 mcg/hr (11/15/15 0845)  . heparin 10,000 units/ 20 mL infusion syringe 800 Units/hr (11/15/15 1014)  . phenylephrine (NEO-SYNEPHRINE) Adult infusion 400 mcg/min (11/15/15 1030)  . dialysis replacement fluid (prismasate) 200 mL/hr at 10/11/15 1618  . dialysis  replacement fluid (prismasate) 300 mL/hr at 11/15/15 1012  . dialysate (PRISMASATE) 2,000 mL/hr at 11/15/15 0711   . sodium chloride   Intravenous Once  . antiseptic oral rinse  7 mL Mouth Rinse QID  . budesonide  0.25 mg Nebulization Q12H  .  ceFAZolin (ANCEF) IV  2 g Intravenous Q12H  . chlorhexidine gluconate  15 mL Mouth Rinse BID  . darbepoetin (ARANESP) injection - NON-DIALYSIS  100 mcg Subcutaneous Q Thu-1800  . feeding supplement (VITAL AF 1.2 CAL)  1,000 mL Per Tube Q24H  . fentaNYL (SUBLIMAZE) injection  50 mcg Intravenous Once  . hydrocortisone sod succinate (SOLU-CORTEF) inj  50 mg Intravenous Q6H  . insulin aspart  0-9 Units Subcutaneous 6 times per day  . ipratropium-albuterol  3 mL Nebulization TID  . pantoprazole sodium  40 mg Per Tube BID  . pneumococcal 23 valent vaccine  0.5 mL Intramuscular Tomorrow-1000   fentaNYL, fentaNYL (SUBLIMAZE) injection, haloperidol lactate, heparin, heparin, heparin, hydrALAZINE, iodixanol, iohexol, ipratropium-albuterol, ondansetron (ZOFRAN) IV, sodium chloride  Assessment/ Plan:  Pt is a 80 y.o. yo female who was admitted on 10/20/2015 with hemorrhagic shock/PEA arrest after PAD intervention with resultant AKI - prolonged hospital course   1. AKI- due to contrast nephropathy and ATN due to hemorrhagic shock and PEA arrest- had been CRRT dependent from 11/4-11/7, then IHD on 11/9 and 11/11. Baseline creatinine in September was 1.07. volume Seems a little increased No dialysis since Friday Also CK a little up- maybe an element of rhabdo. Family have decided on comfort care and will stop CVVHDF 2. HTN/vol=- Much better lung dynamics  3. Anemia- after hemorrhagic shock- RPH 4. Elytes- hyperkalemia- corrected 5. VDRF/chest tubes- per CCM 6. Fever- per CCM 7. PAD- dry gangrene of toes stable  Will sign off and stop CRRT  Eriyah Fernando W [phone]      LOS: 15 Ricardo Kayes W @TODAY @10 :37 AM

## 2015-10-27 NOTE — Progress Notes (Signed)
PULMONARY / CRITICAL CARE MEDICINE   Name: [name] Herman MRN: [id number] DOB: [birth year]    ADMISSION DATE:  26-Oct-2015 CONSULTATION DATE:  2015-10-26  REFERRING MD :  Dr. Bonnielee Haff, IR  CHIEF COMPLAINT:  RP bleeding after IR procedure  INITIAL PRESENTATION: 80 year old female who presented to [facility] Interventional Radiology 10-26-15 for iliac artery stent placement. Procedure was done, no stent needed. Post-operatively she complained of nausea and had hypotension. Distended abdomen. CT with with retroperitoneal blood, not actively bleeding per Dr. Bonnielee Haff. To ICU for PCCM management.   STUDIES:  CT (A) abd/pelvis 10-26-23) >>> Large right retroperitoneal hemorrhage extending from the right inguinal region after angiogram. There is no active extravasation or bleeding. There is no pseudoaneurysm. Lower extremity arterial duplex 10/26/23) No evidence of psuedoaneurysm in the right groin. A hematoma is noted measuring 3.55cm. CXR (11/3): No significant change in left lower lobe atelectasis or consolidation and mild bilateral perihilar atelectasis CXR (11/4): Endotracheal tube tip remains at the carina. Proximal repositioning of approximately 2 to 3 cm suggested. Remaining lines and tubes in stable position. Persistent bibasilar atelectasis. Mild infiltrate left lung base cannot be excluded. CXR (11/7): 1. Low endotracheal tube with tip 5 mm above the carina. 2. Mild improvement basilar lung aeration with residual retrocardiac atelectasis or pneumonia. CXR (11/8): Tube and catheter positions as described without pneumothorax. Note that the endotracheal tube tip is essentially at the carina. It may be prudent to consider withdrawing endotracheal tube 2-3 cm CXR (11/9): lines stable. Patchy bibasilar pulmonary infiltrates. KUB (11/9): coiled feeding tube CXR (11/11):  Bilateral chest tube placement with decreased bilateral pneumothoraces, question tiny persistent right apical component on the right. Complete  resolution on the left.  Endotracheal tube is now 1 cm from the carina, consider retraction of 1-2 cm.  Subcutaneous emphysema in both supraclavicular regions. Probable pneumomediastinum at the thoracic inlet. Linear air again seen in the upper abdomen, location uncertain.   SIGNIFICANT EVENTS: 26-Oct-2023 to IR for iliac stent, stent not needed, post op RP blood. Shock. To ICU 2023/10/26 coded pea 10 min , OR for evacuation hematoma and iliac repair  11/2- GI bleeding, Total:PRBC 17; FFP 3; Cryo 11 renal called 11/3- less bleeding from OGT, no pressors 11/4- HD transitioned to CVVH 11/7- CRRT stopped 11/9- intermittent HD started 11/11- pneumothorax, chest tube placement 11/12 toe ischemia as below. 11/13 no pressors. Vomiting. Gastric distention  11/15 Pressor support, back on CRRT   SUBJECTIVE:  Chronically ill appearing female, sedated on vent   VITAL SIGNS: Temp:  [96.3 F (35.7 C)-98.2 F (36.8 C)] 98.1 F (36.7 C) (11/16 0600) Pulse Rate:  [73-123] 86 (11/16 0600) Resp:  [19-41] 22 (11/16 0600) BP: (70-193)/(39-149) 94/57 mmHg (11/16 0600) SpO2:  [97 %-100 %] 100 % (11/16 0600) Arterial Line BP: (76-271)/(33-242) 99/45 mmHg (11/16 0600) FiO2 (%):  [30 %] 30 % (11/16 0600) HEMODYNAMICS: CVP:  [1 mmHg-8 mmHg] 8 mmHg VENTILATOR SETTINGS: Vent Mode:  [-] PRVC FiO2 (%):  [30 %] 30 % Set Rate:  [10 bmp] 10 bmp Vt Set:  [420 mL] 420 mL Pressure Support:  [5 cmH20] 5 cmH20 Plateau Pressure:  [17 cmH20-28 cmH20] 28 cmH20 INTAKE / OUTPUT:  Intake/Output Summary (Last 24 hours) at 11-10-15 0654 Last data filed at 11/10/15 0600  Gross per 24 hour  Intake 2237.71 ml  Output   5495 ml  Net -3257.29 ml    PHYSICAL EXAMINATION:  General: eyes open, Intubated. Neuro: PERRLA, pupils extremely sluggish, +scleral icterus  HEENT: No thryomegaly, JVD Cardiovascular:  S1S2, RRR Lungs: moderate substernal retractions, globally decreased breathsounds, intubated, b/l chest tubes in place; no  airleak Abdomen:  Soft, non-tender, hypoactive bs. Abdominal bandages in place Musculoskeletal:  Lower extremities with 1+, LE pulses difficult to palpate Skin:  Stable Necrotic appearing R toes, see photo below  Photos (10/03/05)      LABS:  CBC  Recent Labs Lab 10/09/15 0316 10/11/15 0325 2015/10/19 0330  WBC 26.6* 41.5* 52.5*  HGB 9.3* 10.6* 11.2*  HCT 27.7* 32.2* 34.6*  PLT 188 264 251   Coag's  Recent Labs Lab 10/06/15 0830 10/11/15 0325 10-19-2015 0330  APTT  --   --  73*  INR 1.56* 1.31  --    BMET  Recent Labs Lab 10/11/15 1441 10/11/15 1600 10/19/2015 0330  NA 137 137 136  K 4.1 4.3 4.9  CL 104 98* 99*  CO2 BUN 28* 24* 13  CREATININE 1.57* 1.41* 1.17*  GLUCOSE 110* 114* 151*   Electrolytes  Recent Labs Lab 10/11/15 0325 10/11/15 1441 10/11/15 1600 10-19-2015 0330  CALCIUM 8.2* 7.3* 8.2* 8.6*  MG 2.3 2.2  --  2.4  PHOS 6.0*  3.2 4.0 2.8 2.5  2.6   Sepsis Markers  Recent Labs Lab 10/08/15 0532 10/09/15 0316 10/10/15 0400  PROCALCITON 3.45 2.75 1.76   ABG  Recent Labs Lab 10/07/15 2259 10/08/15 0335 10/10/15 0322  PHART 7.513* 7.502* 7.407  PCO2ART 33.7* 31.5* 30.6*  PO2ART 104.0* 139* 123*   Liver Enzymes  Recent Labs Lab 10/09/15 1045  10/11/15 0325 10/11/15 1600 2015/10/19 0330  AST 41  --   --   --   --   ALT 62*  --   --   --   --   ALKPHOS 119  --   --   --   --   BILITOT 4.3*  --   --   --   --   ALBUMIN 1.9*  < > 2.4* 2.4* 2.6*  < > = values in this interval not displayed. Cardiac Enzymes No results for input(s): TROPONINI, PROBNP in the last 168 hours. Glucose  Recent Labs Lab 10/11/15 0810 10/11/15 1116 10/11/15 1559 10/11/15 1923 2015/10/19 0016 19-Oct-2015 0418  GLUCAP 88 104* 102* 108* 136* 131*    Imaging No results found. ASSESSMENT / PLAN:  PULMONARY A: Acute resp failure, hypoxia s/p PEA arrest  COPD  Spontaneous bilateral pneumothorax, now s/p bilateral CT >failing weaning  efforts, moderate substernal retractions on exam.  P:   Fentanyl, wean as able Precedex CRRT resumed 11/15  Possible terminal extubation today  CARDIOVASCULAR CVL LIJ sheath 11/1 >>>  ALINE left fem 11/1>>> A:  Hemorrhagic shock resolved Ischemic toes  PAD H/o HYPERTENSION, currently hypotensive requiring pressors Trop leak, s/p cpr P:  Euvolemic goal  Hold home antihypertensives  Neo-synephrine while on CRRT  RENAL A:   Acute on chronic renal failure, ATN, post arrest , shock, rhabdo -->stable Hypernatremia, improved Hyperkalemia, improved Hypophos, improved CRRT filter clotting  P:   CRRT per renal, plan to discontinue this today.  Daily BMET Replete electrolytes as needed   GASTROINTESTINAL A:   H/o PUD abd pain and gastric reflux vs ileus  P:   Restart TF- advance as tolerated  PPI bid  HEMATOLOGIC A:   Acute retroperitoneal hemorrhage s/p IR arteriogram  Consumptive / dilutional coagulapthy DIC -->resolved hgb 11.2, plt 251 P:  CBC to q24 Follow CBC, platelets, coags  INFECTIOUS A:  HCAP -->prelim SA 1/2 coag neg staph Hypothermia-improving 11/15  WBC 52.5 P:   Blood cx 11/9>> 1/2 coag neg SA Trach aspirate>> Staph aureus  Vanc 11/9>>11/14 Zosyn 11/9>>11/14 Cefazolin 11/14>>  ENDOCRINE A:   Chronic prednisone for polymyalgia rheumatica GI bleed P:   Hydrocortisone to 50 mg q6 Follow glucose on Bmet SSI  NEUROLOGIC A:   Acute metabolic encephalopathy up and down R/o anoxia, post arrest P:   RASS goal: 0 Monitor closely Change scheduled haldol to PRN   FAMILY  - Updates: family updated in full 11/11 - Inter-disciplinary family meet or Palliative Care meeting due by:  11/7  Anticipate that patient will be extubated this afternoon.  She has made little progress and it would seem that comfort measures would be in her best interest.  Giorgio Chabot M. Nadine CountsGottschalk, DO PGY-2, [facility] 10/25/2015, 6:54 AM

## 2015-10-27 NOTE — Progress Notes (Signed)
CRITICAL VALUE ALERT  Critical value received:  52.5 WBC   Date of notification:  2015/01/22  Time of notification:  0500  Critical value read back: yes  Nurse who received alert:  Hazel Samshristian Anandi Abramo RN   MD notified (1st page):  MD Molli KnockYacoub   Time of first page: 0600     MD notified (2nd page):  Time of second page:  Responding MD:  Ermelinda DasGretchen Elink RN   Time MD responded:  [phone]

## 2015-10-27 NOTE — Progress Notes (Signed)
0800 Initial assessment patient responded to eye opening by calling her name. Patient does not respond to pain or sensation in upper or lower extremities.    10/18/2015 0800  Glasgow Coma Scale  Eye Opening 3  Modified Verbal Response (INTUBATED) 3  Best Motor Response 1  Glasgow Coma Scale Score 7

## 2015-10-27 NOTE — Progress Notes (Signed)
Terminal extubation performed @ 10:45.  RN at bedside.

## 2015-10-27 NOTE — Progress Notes (Signed)
100mL of expired Fentanyl gtt wasted in the sink. Juliette AlcideMelinda, RN witnessed.

## 2015-10-27 NOTE — Progress Notes (Signed)
Family at bedside, updated of current condition. Comfort cart ordered and placed outside of room. Waiting for additional family for further instruction. Will continue to monitor patient and provide comfort measures for patient and family.

## 2015-10-27 NOTE — Progress Notes (Signed)
1121 Patient asystole on the monitor. Pupils non reactive to light, no heart tones auscultated, no breath sounds auscultated, no palpable pulses. Death verified with Della GooLindsay Flood, RN. Family at bedside. CDS and MD notified. Chaplin called to bedside. Will continue to provide comfort measures to the family.

## 2015-10-27 NOTE — Discharge Summary (Signed)
   [name] FlamingMartha H Herman was a 80 y.o. female admitted on 10/26/2015 for iliac artery stent placement.  She developed nausea and hypotension post-procedure.  She was found to have retroperitoneal hemorrhage.  She was transferred to ICU.  She developed PEA cardiac arrest.  She was resuscitated and taken to OR for evacuation of hematoma and repair of iliac artery.  She received multiple transfusions of PRBC, FFP, Cryo.  Her renal function got progressively worse, and nephrology was consulted.  She was started on CRRT.  She was treated with antibiotics for pneumonia.  She was found to have MSSA in her sputum.  She developed mucus plugging, and then developed b/l pneumothorax while on ventilator.  She required b/l chest tub placement.  She required more pressors, but was not able to maintain her blood pressure.  Her clinical status became progressively worse.  She was DNR, and family decided to proceed with comfort measures on 03-02-15.  She was subsequently extubated.  She expired on 04-Mar-2015.  Final Diagnoses: Hemorrhagic shock Retroperitoneal hemorrhage Acute blood loss anemia Iliac artery injury MSSA HCAP Septic shock Acute respiratory failure Acute kidney injury Bilateral pneumothorax Hx of COPD PEA cardiac arrest Hx of hypertension Elevated troponin from demand ischemia and CPR Hyperkalemia Hypernatremia Hypophosphatemia DIC Hx of Polymyalgia Rheumatica Acute metabolic encephalopathy  Coralyn HellingVineet Rilley Poulter, MD [facility] 10/20/2015, 9:40 AM

## 2015-10-27 NOTE — Progress Notes (Signed)
[facility]   Subjective  - does not follow commands   Objective 105/76 103 97.9 F (36.6 C) (Core (Comment)) 30 98%  Intake/Output Summary (Last 24 hours) at 10/11/2015 0945 Last data filed at 10/09/2015 0913  Gross per 24 hour  Intake 2352.88 ml  Output   5452 ml  Net -3099.12 ml   Abdomen diffuse ecchymosis but incisions are healing  Assessment/Planning: POD 15 from repair of right external iliac artery Family considering comfort care at this point Will follow from a distance.  Call if questions.  [name] Herman Herman, [name] Herman 10/20/2015 9:45 AM --  Laboratory Lab Results:  Recent Labs  10/11/15 0325 10/11/2015 0330  WBC 41.5* 52.5*  HGB 10.6* 11.2*  HCT 32.2* 34.6*  PLT 264 251   BMET  Recent Labs  10/11/15 1600 10/04/2015 0330  NA 137 136  K 4.3 4.9  CL 98* 99*  CO2 23 23  GLUCOSE 114* 151*  BUN 24* 13  CREATININE 1.41* 1.17*  CALCIUM 8.2* 8.6*    COAG Lab Results  Component Value Date   INR 1.31 10/11/2015   INR 1.56* 10/06/2015   INR 1.35 10/01/2015   No results found for: PTT

## 2015-10-27 NOTE — Telephone Encounter (Signed)
[name] Herman is aware that pt had Prevnar 13 on 11/02/2015.

## 2015-10-27 NOTE — Progress Notes (Signed)
PCCM PROGRESS NOTE  [name] FlamingMartha H Cuccia is a 80 y.o. female admitted on 10/26/2015 for iliac artery stent placement.  She developed nausea and hypotension post-procedure.  She was found to have retroperitoneal hemorrhage.  She was transferred to ICU.  She developed PEA cardiac arrest.  She was resuscitated and taken to OR for evacuation of hematoma and repair of iliac artery.  She received multiple transfusions of PRBC, FFP, Cryo.  Her renal function got progressively worse, and nephrology was consulted.  She was started on CRRT.  She was treated with antibiotics for pneumonia.  She was found to have MSSA in her sputum.  She developed mucus plugging, and then developed b/l pneumothorax while on ventilator.  She required b/l chest tub placement.  She required more pressors, but was not able to maintain her blood pressure.  Her clinical status became progressively worse.  She was DNR, and family decided to proceed with comfort measures on 08-Mar-2015.  She was subsequently extubated.  Subjective: Was on multiple pressors, CRRT, vent.  Objective: BP 49/15 mmHg  Pulse 31  Temp(Src) 97.2 F (36.2 C) (Core (Comment))  Resp 27  Ht 5' (1.524 m)  Wt 125 lb 10.6 oz (57 kg)  BMI 24.54 kg/m2  SpO2 62%  General: ill appearing HEENT: ETT in place at time of examination Cardiac: tachycardic, irregular Chest: b/l crackles Abd: soft Ext: 1+ edema Neuro: Comatose  Assessment: Hemorrhagic shock Retroperitoneal hemorrhage Acute blood loss anemia Iliac artery injury MSSA HCAP Septic shock Acute respiratory failure Acute kidney injury Bilateral pneumothorax Hx of COPD PEA cardiac arrest Hx of hypertension Elevated troponin from demand ischemia and CPR Hyperkalemia Hypernatremia Hypophosphatemia DIC Hx of Polymyalgia Rheumatica Acute metabolic encephalopathy  Plan: DNR Withdrawal of vent support Transition to comfort measures Fentanyl, Ativan as needed for comfort  Updated pt's family at  bedside  Coralyn HellingVineet Shenoa Hattabaugh, MD [facility] November 03, 2015, 11:13 AM Pager:  [phone] After 3pm call: [phone]

## 2015-10-27 DEATH — deceased

## 2015-12-23 ENCOUNTER — Ambulatory Visit: Payer: Commercial Managed Care - HMO | Admitting: Family Medicine

## 2016-06-03 IMAGING — CR DG CHEST 1V PORT
1 series · 1 of 1 positions shown · non-contrast
Comparison: 10/26/2015.

CLINICAL DATA: Acute respiratory failure.

EXAM:
PORTABLE CHEST 1 VIEW

[AP]
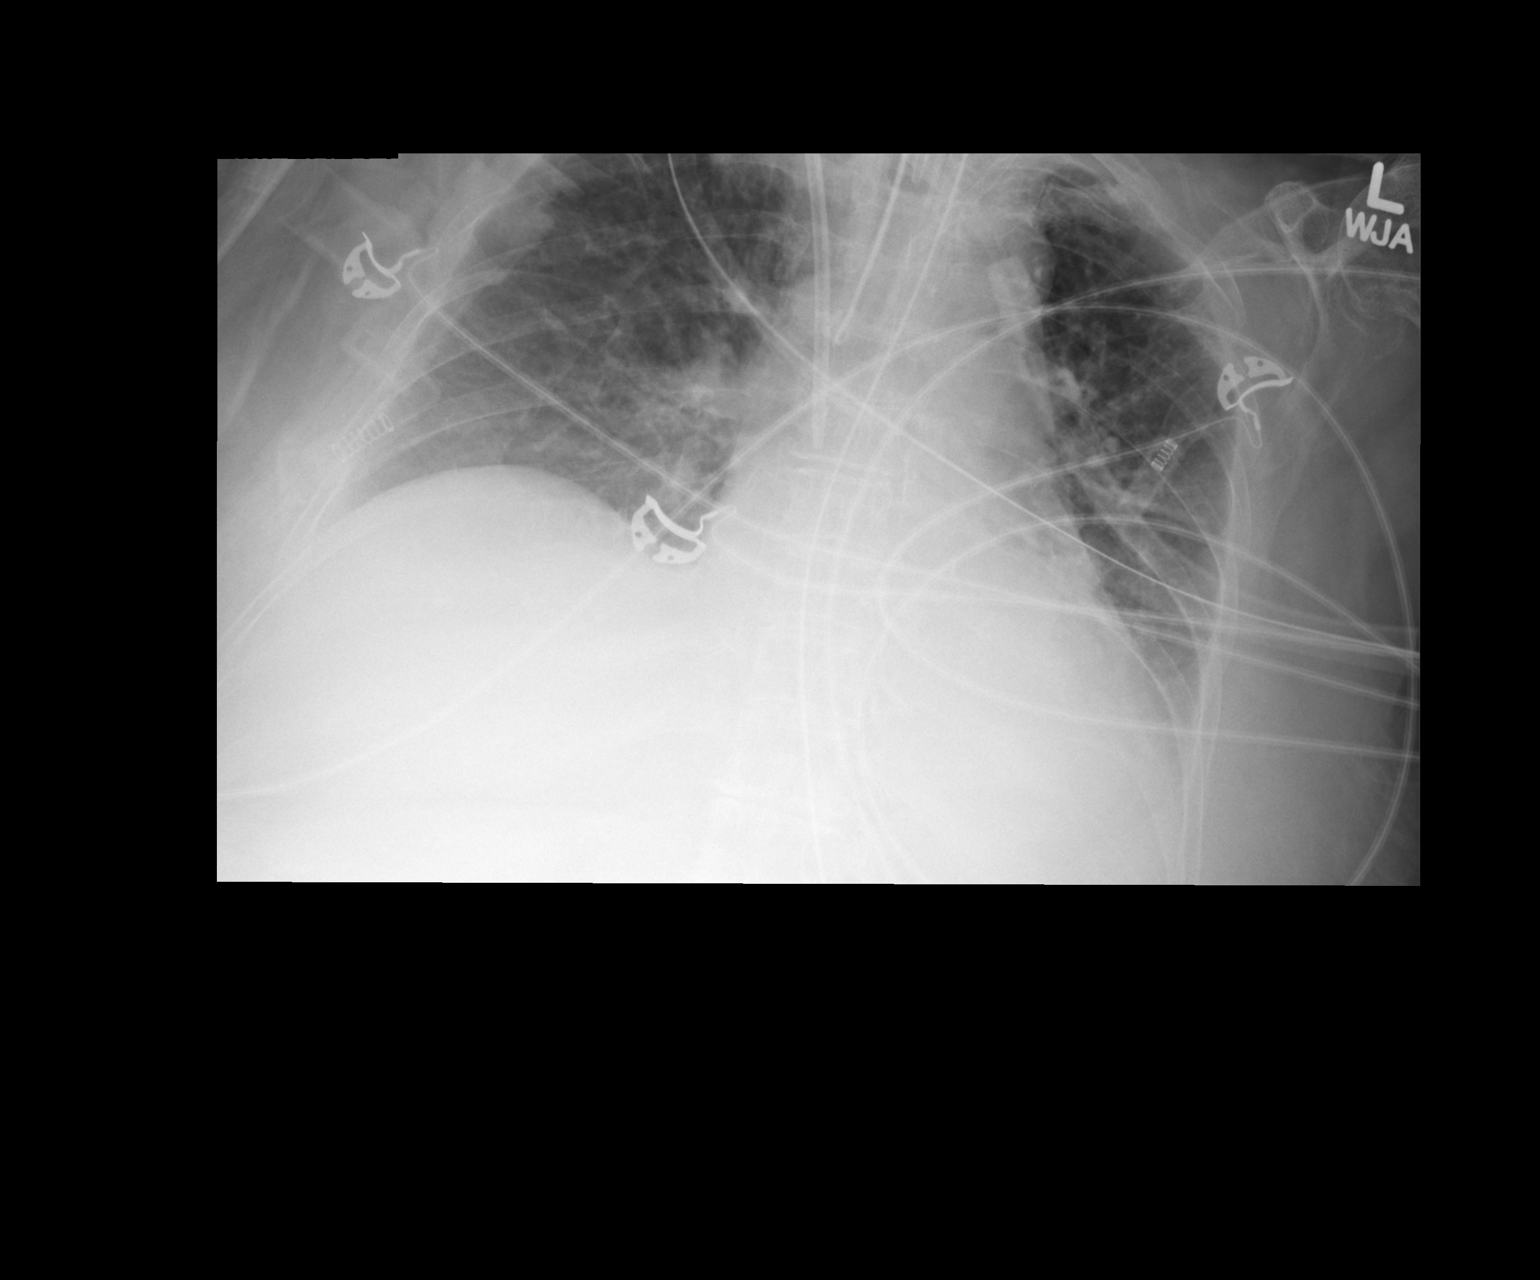

[1 of 1 positions shown; findings below may reference images not displayed]

FINDINGS: Endotracheal tube tip again noted at the carina. Proximal
repositioning suggested. Bilateral IJ lines, NG tube in stable
position. Mediastinum hilar structures are normal. Heart size
stable. Low lung volumes with bibasilar atelectasis. Mild infiltrate
left lung base cannot be excluded. Small left pleural effusion
cannot be excluded. No pneumothorax .
IMPRESSION: 1. Endotracheal tube tip remains at the carina. Proximal
repositioning of approximately 2 to 3 cm suggested. Remaining lines
and tubes in stable position.
2. Persistent bibasilar atelectasis. Mild infiltrate left lung base
cannot be excluded.
Critical Value/emergent results were called by telephone at the time
of interpretation on 09/30/2015 at [DATE] to nurse Ceola, who
verbally acknowledged these results.

## 2016-06-04 IMAGING — CR DG CHEST 1V PORT
1 series · 1 of 1 positions shown · non-contrast
Comparison: 09/30/2015, 09/29/2015 and 09/28/2015

CLINICAL DATA: Acute respiratory failure.

EXAM:
PORTABLE CHEST 1 VIEW

[AP]
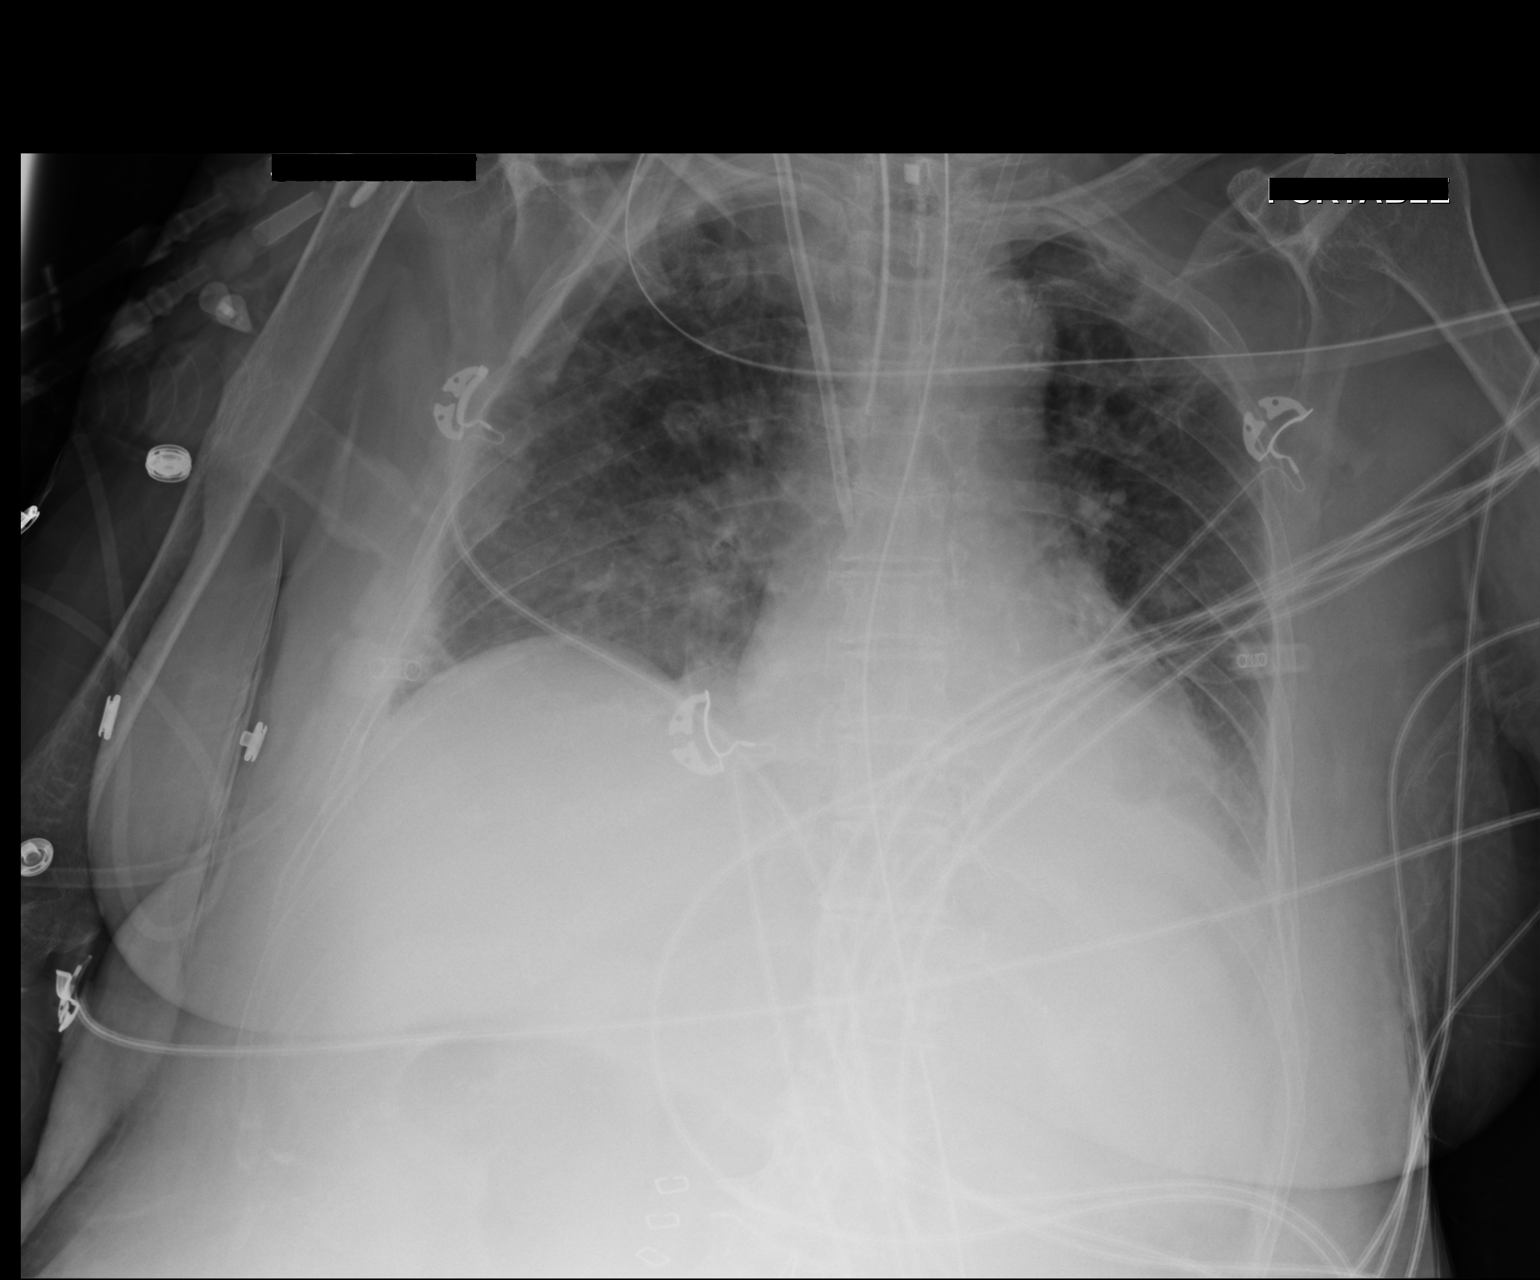

[1 of 1 positions shown; findings below may reference images not displayed]

FINDINGS: Endotracheal tube is 17 mm above the carina. Central catheter is in
good position just below the carina. NG tube tip is in the body of
the stomach. Venous sheath tip is in the left innominate vein.

There is persistent atelectasis at the left lung base. Small
bilateral effusions. Heart size and pulmonary vascularity are
normal.
IMPRESSION: Persistent atelectasis and small effusion at the left base.
Increased haziness at the right base is most likely a small
posteriorly layered pleural effusion.

## 2016-06-09 IMAGING — CR DG CHEST 1V PORT
1 series · 1 of 1 positions shown · non-contrast
Comparison: Portable chest x-ray October 05, 2015

CLINICAL DATA: Respiratory failure, ventilated patient, hemorrhagic
shock and cardiac arrest.

EXAM:
PORTABLE CHEST 1 VIEW

[AP]
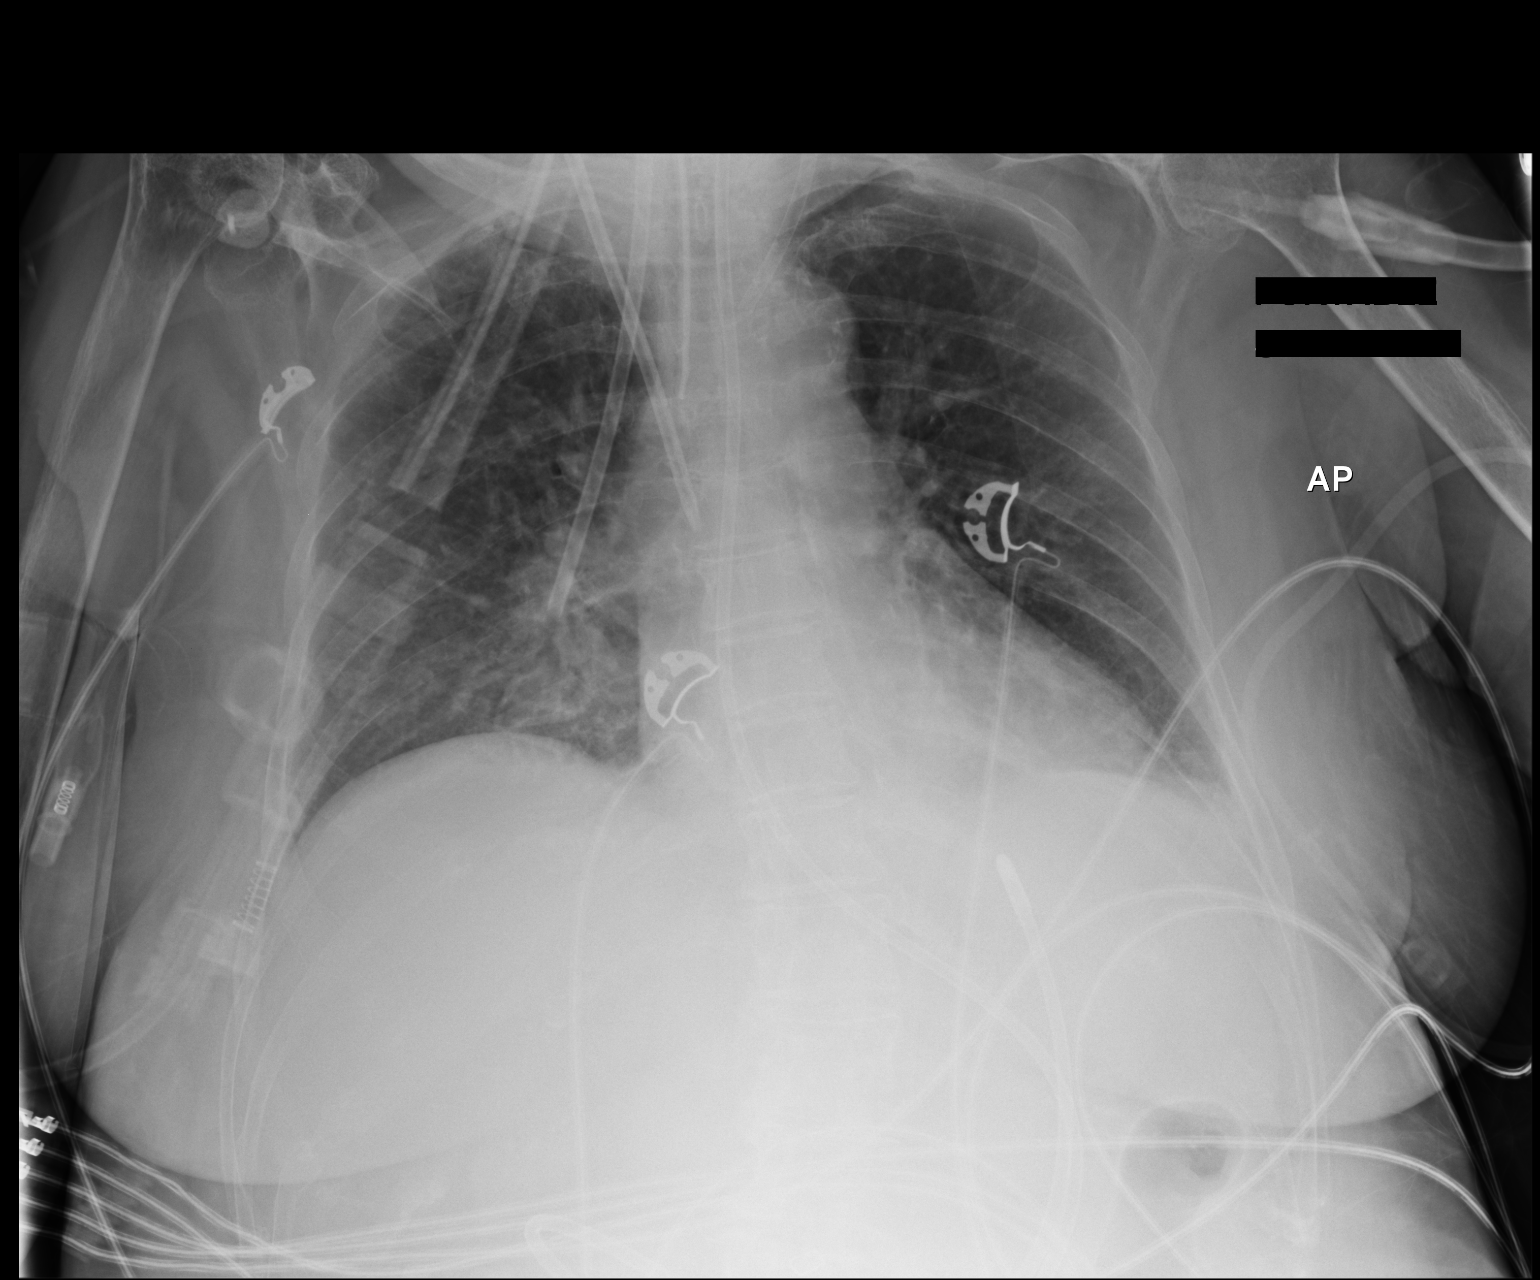

[1 of 1 positions shown; findings below may reference images not displayed]

FINDINGS: The lungs are adequately inflated. The interstitial markings are
slightly less conspicuous today. Confluent infrahilar density
persists bilaterally. There is a trace of pleural fluid on the left.
The heart is normal in size. The pulmonary vascularity is prominent
centrally without significant cephalization.

The endotracheal tube tip projects 2 point 1 cm above the carina.
The feeding tube tip projects below the inferior margin of the
image. The dual-lumen right internal jugular venous catheter tip
projects over the midportion of the SVC. The left internal jugular
venous catheter tip projects at the junction of the left internal
jugular vein and left subclavian vein. Subtle kinks are noted
proximally in 2 places in this catheter. There is a tubular
structure that projects to the right of the endotracheal tube which
likely lies external to the patient. There are degenerative changes
of the right shoulder.
IMPRESSION: *Stable coarse infrahilar subsegmental atelectasis and trace left
pleural effusion. Stable mild interstitial prominence. The support
tubes are in reasonable position.

## 2016-06-10 IMAGING — CR DG CHEST 1V PORT
1 series · 1 of 1 positions shown · non-contrast
Comparison: Radiographs obtained yesterday.

CLINICAL DATA: Decreased oxygen saturation.  Intubated.

EXAM:
PORTABLE CHEST 1 VIEW

[AP]
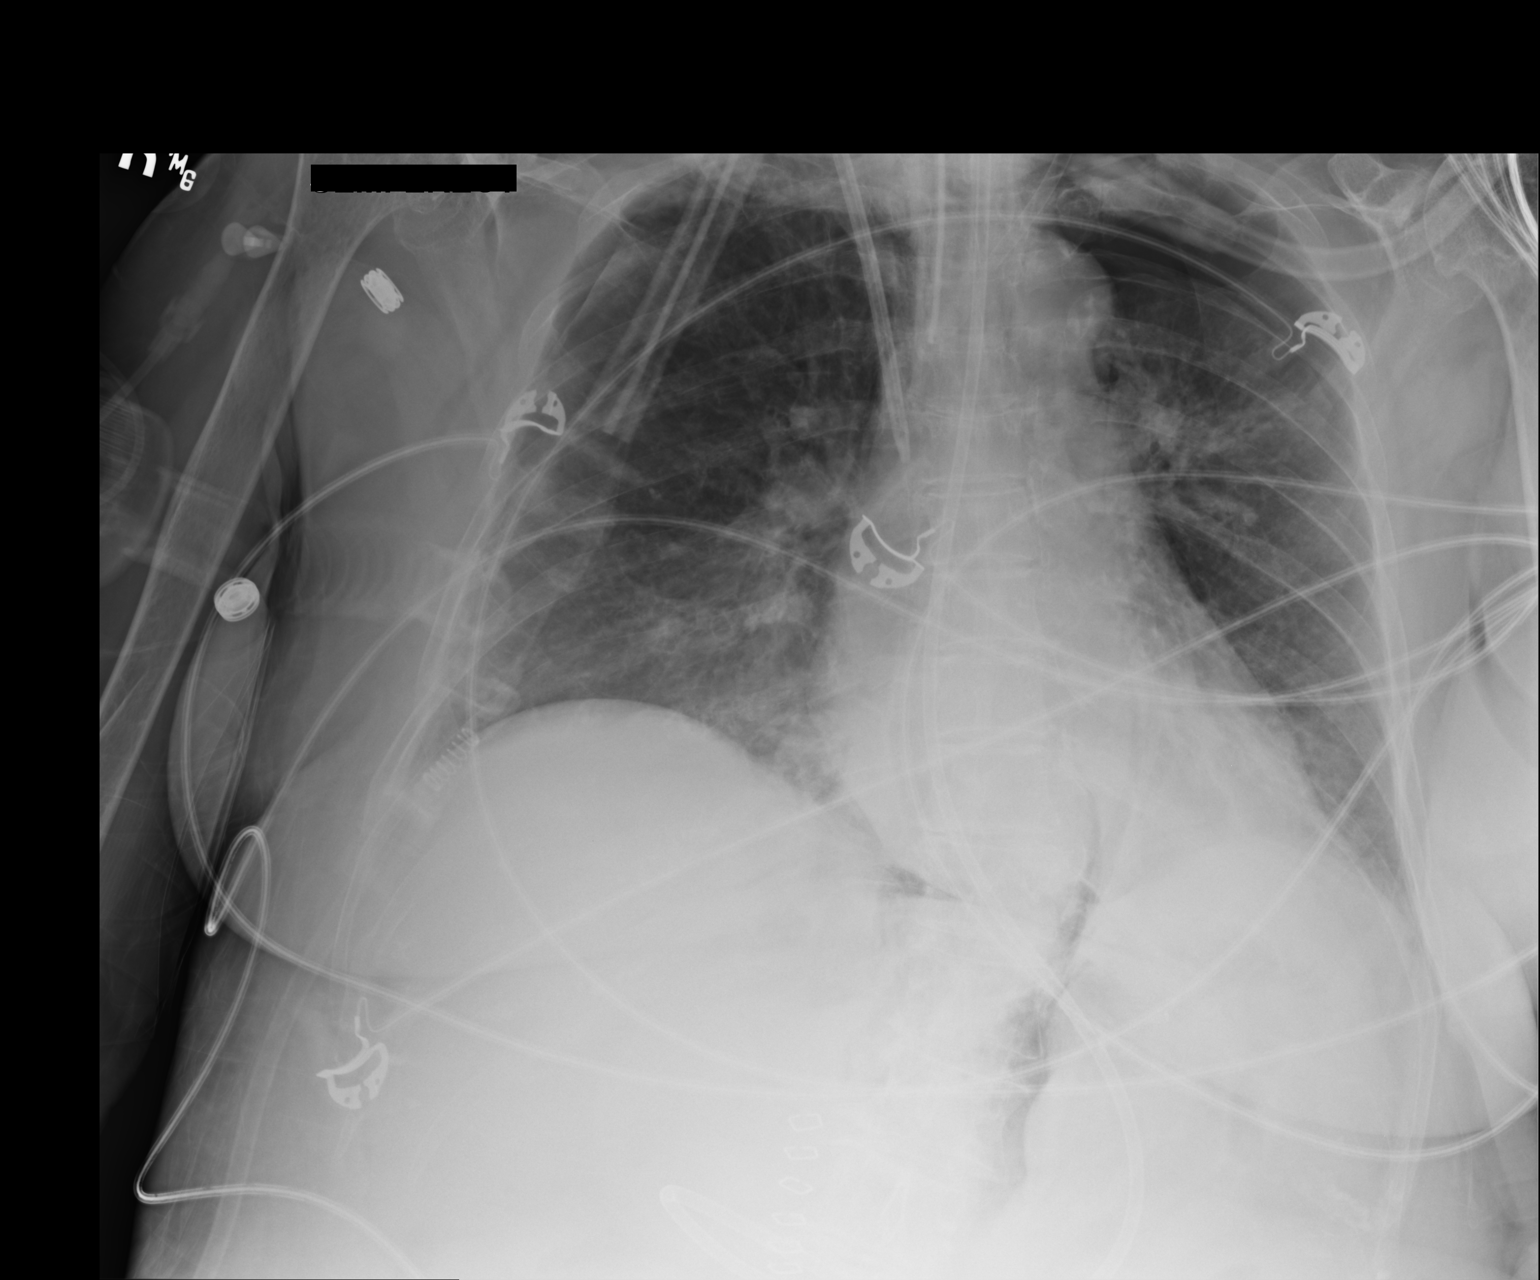

[1 of 1 positions shown; findings below may reference images not displayed]

FINDINGS: Bilateral pneumothoraces, new. This is moderate on the left
approximately 20% and small on the right. There is subcutaneous
emphysema in both supraclavicular regions. There is linear air in
the midline upper abdomen that is not definitively intraluminal.
Endotracheal tube 2.1 cm from the carina. Enteric tube in place, tip
below the diaphragm. Right internal jugular catheter tip in the SVC.
Left internal jugular venous catheter in the region of the
brachiocephalic vein. Cardiomediastinal contours are unchanged.
Bilateral perihilar atelectasis, worsening in the left perihilar
lung. There is no mediastinal shift.
IMPRESSION: 1. New bilateral pneumothoraces, moderate on the left and small on
the right. Development of supraclavicular subcutaneous emphysema.
2. Linear air in the upper abdomen is not definitively intraluminal,
and may be tracking from the thorax given the pneumothoraces and
supraclavicular air. CT could provide detailed characterization.
3. Increased left perihilar atelectasis.

Critical Value/emergent results were called by telephone at the time
of interpretation on 10/07/2015 at [DATE] to PA Boldi, who
verbally acknowledged these results.

## 2016-06-12 IMAGING — CR DG CHEST 1V PORT
1 series · 1 of 1 positions shown · non-contrast
Comparison: 10/07/2015 and prior studies

CLINICAL DATA: 80-year-old female with acute respiratory failure.

EXAM:
PORTABLE CHEST 1 VIEW

[AP]
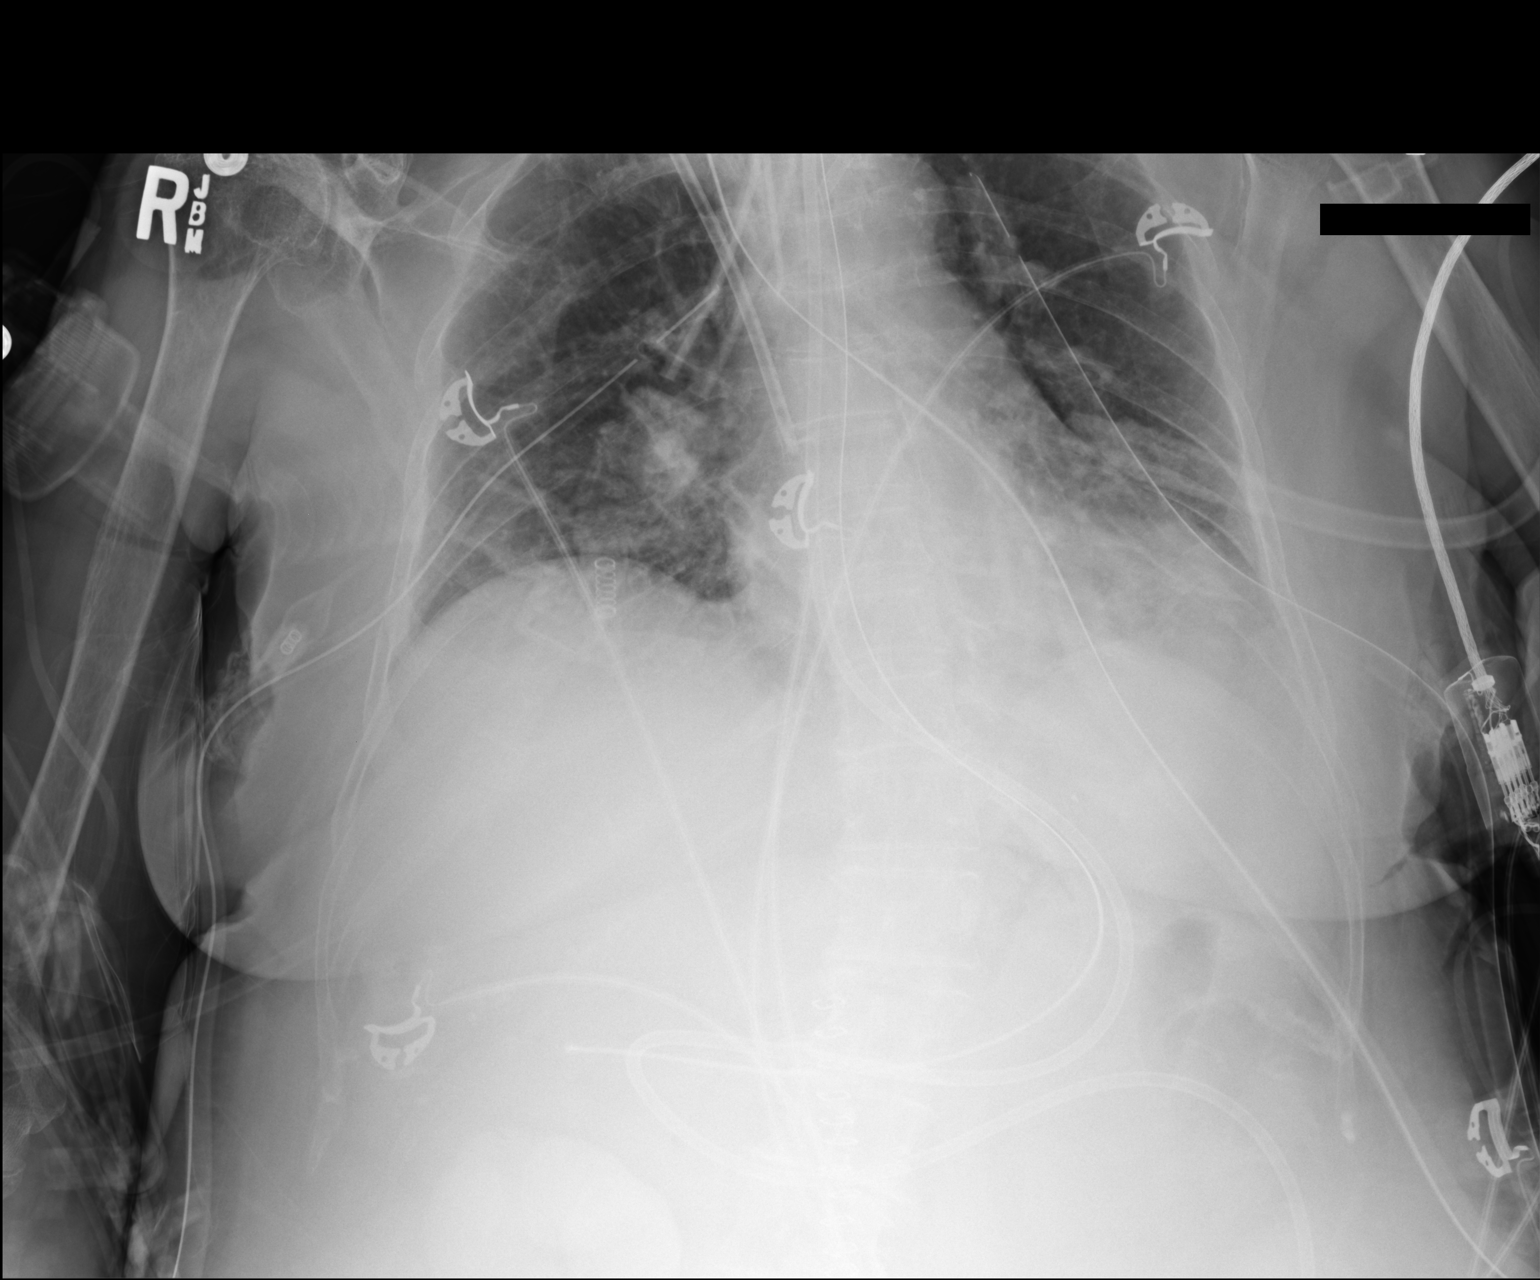

[1 of 1 positions shown; findings below may reference images not displayed]

FINDINGS: Cardiomediastinal silhouette is unchanged.

An endotracheal tube with tip 1 cm above the carina, NG tube with
tip overlying the peripyloric region, small bore feeding tube in the
jejunum with tip off the field of view, right IJ central venous
catheter with tip overlying the superior cavoatrial junction, left
IJ central venous catheter sheath and bilateral thoracostomy tubes
again noted.

There is no evidence of pneumothorax.

Mild bibasilar atelectasis again identified in this mildly low
volume film.
IMPRESSION: Unchanged appearance of the chest with support apparatus as
described. No evidence of pneumothorax. Continued mild bibasilar
atelectasis.
